# Patient Record
Sex: Male | Born: 1975 | Race: White | Hispanic: No | Smoking: Former smoker
Health system: Southern US, Community
[De-identification: ages and names within clinical notes are randomized; demographics above are authoritative.]

## PROBLEM LIST (undated history)

## (undated) DIAGNOSIS — T148XXA Other injury of unspecified body region, initial encounter: Secondary | ICD-10-CM

## (undated) DIAGNOSIS — E119 Type 2 diabetes mellitus without complications: Secondary | ICD-10-CM

## (undated) DIAGNOSIS — Z95810 Presence of automatic (implantable) cardiac defibrillator: Secondary | ICD-10-CM

## (undated) DIAGNOSIS — I519 Heart disease, unspecified: Secondary | ICD-10-CM

## (undated) DIAGNOSIS — F32A Depression, unspecified: Secondary | ICD-10-CM

## (undated) DIAGNOSIS — F329 Major depressive disorder, single episode, unspecified: Secondary | ICD-10-CM

## (undated) DIAGNOSIS — J45909 Unspecified asthma, uncomplicated: Secondary | ICD-10-CM

## (undated) DIAGNOSIS — F419 Anxiety disorder, unspecified: Secondary | ICD-10-CM

## (undated) HISTORY — DX: Type 2 diabetes mellitus without complications: E11.9

## (undated) HISTORY — PX: APPENDECTOMY: SHX54

## (undated) NOTE — *Deleted (*Deleted)
***In Progress*** PCP: [facility] Primary Cardiologist: Dr Shirlee Latch   HPI:  [name] is a 74 y.o. with history of anxiety, asthma, and depression.Previously a heavy drinker and smoker.  No longer has driver's license due to multiple DWIs.   Admitted 10/08/19 after syncopal episode and dyspnea with exertion. Had small SAH and rib fracture. Had echo that showed severely reduced EF, new onset acute systolic heart failure. Cardiac cath was negative for coronary disease, and showed low filling pressures and adequate cardiac output. Discharged with Life Vest given concern that syncope was arrhythmic. Started low dose HF meds.   Was evaluated at [facility] ED in 3/21 due to dyspnea and rib pain. New rib fracture was noted on the left. SAH had resolved. CXR and CTA negative. He was discharged to home.   Echo in 6/21 showed that EF remained 30%. Now with [employer] ICD in 8/21.   He returned to clinic with Dr. Shirlee Latch 05/09/20 for follow up of CHF.  He was not drinking or smoking.  Felt the best that he's felt in a long time.  No activity limitations, no significant exertional dyspnea or chest pain.  No lightheadedness.  No orthopnea/PND.    Today he returns to HF clinic for pharmacist medication titration. At last visit with Dr. Shirlee Latch on 05/09/20, eplerenone was increased to 50 mg daily and carvedilol was increased to 9.375 mg BID.   Overall feeling ***. Dizziness, lightheadedness, fatigue:  Chest pain or palpitations.  How is your breathing?: *** SOB Able to complete all ADLs. Activity level ***  Weight at home pounds. Takes furosemide/torsemide/bumex *** mg *** daily.  PND/Orthopnea:   Appetite ***.   HF Medications: Carvedilol 9.375 mg BID  Entresto 97/103 mg BID Eplerenone 50 mg daily  Dapagliflozin 10 mg daily  Digoxin 0.125 mg daily  Has the patient been experiencing any side effects to the medications prescribed?  {YES NO:22349}  Does the patient have any problems  obtaining medications due to transportation or finances?   {YES NO:22349} [place] Medicaid  Understanding of regimen: {excellent/good/fair/poor:19665} Understanding of indications: {excellent/good/fair/poor:19665} Potential of compliance: {excellent/good/fair/poor:19665} Patient understands to avoid NSAIDs. Patient understands to avoid decongestants.    Pertinent Lab Values 05/09/20: . Serum creatinine 1.13, BUN 20, Potassium 4.5, Sodium 139 Digoxin 0.4   Vital Signs: . Weight: *** (last clinic weight: 204 lbs) . Blood pressure: ***  . Heart rate: ***   Assessment: 1. Chronic Systolic HF:  Nonischemic cardiomyopathy. Echo in 2/21 showedLV dilation with EF <20%, the RV appeared normal. Given the LV dilation, Dr. Shirlee Latch suspected that the cardiomyopathy was not new (probably less likely to be a stress cardiomyopathy related to the subarachnoid hemorrhage). 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 He is no longer drinking. Cannot rule out viral cardiomyopathy. He has multiple family members with "heart disease" but he does not have details. Coronary angiography did not show any significant CAD.Cardiac MRI showed no infiltrative disease, no prior MI, EF 25%, RV normal. Echo in 6/21 showed EF 30% with diffuse hypokinesis, mild LV dilation. He had a Environmental manager ICD placed.  NYHA class I symptoms, he is not volume overloaded  - Continue carvedilol 9.375 mg BID. - Continue Entresto 97/103 BID.  - Continue eplerenone 50 mg daily.  - Continue Farxiga 10 mg daily.  - Continue digoxin 0.125 mg daily.  2. Syncope: Initial presentation with syncope, no prodrome.  Given low EF, ?arrhythmic event.  He now has ICD.   3. SAH: Small  volume SAH post-syncope in 2/21.  - Repeat CT of head 10/21/19 showed SAH has resolved.   4. ETOH abuse: Reports complete cessation.   Plan: 1) Medication changes: Based on clinical presentation, vital signs and recent labs will *** 2) Labs: *** 3)  Follow-up: ***   Karle Plumber, PharmD, BCPS, BCCP, CPP Heart Failure Clinic Pharmacist [phone]

---

## 2002-07-31 ENCOUNTER — Emergency Department (HOSPITAL_COMMUNITY): Admission: EM | Admit: 2002-07-31 | Discharge: 2002-07-31 | Payer: Self-pay | Admitting: Emergency Medicine

## 2015-01-17 DIAGNOSIS — J309 Allergic rhinitis, unspecified: Secondary | ICD-10-CM | POA: Insufficient documentation

## 2015-02-01 DIAGNOSIS — M25562 Pain in left knee: Secondary | ICD-10-CM | POA: Insufficient documentation

## 2015-02-16 DIAGNOSIS — F4321 Adjustment disorder with depressed mood: Secondary | ICD-10-CM | POA: Insufficient documentation

## 2016-05-03 ENCOUNTER — Encounter (HOSPITAL_COMMUNITY): Payer: Self-pay | Admitting: Cardiology

## 2016-05-03 ENCOUNTER — Emergency Department (HOSPITAL_COMMUNITY): Payer: Self-pay

## 2016-05-03 ENCOUNTER — Emergency Department (HOSPITAL_COMMUNITY)
Admission: EM | Admit: 2016-05-03 | Discharge: 2016-05-03 | Disposition: A | Discharge status: Home / Self Care | Payer: Self-pay | Attending: Emergency Medicine | Admitting: Emergency Medicine

## 2016-05-03 DIAGNOSIS — J40 Bronchitis, not specified as acute or chronic: Secondary | ICD-10-CM | POA: Insufficient documentation

## 2016-05-03 DIAGNOSIS — Z79899 Other long term (current) drug therapy: Secondary | ICD-10-CM | POA: Insufficient documentation

## 2016-05-03 DIAGNOSIS — Z791 Long term (current) use of non-steroidal anti-inflammatories (NSAID): Secondary | ICD-10-CM | POA: Insufficient documentation

## 2016-05-03 DIAGNOSIS — F17200 Nicotine dependence, unspecified, uncomplicated: Secondary | ICD-10-CM | POA: Insufficient documentation

## 2016-05-03 HISTORY — DX: Other injury of unspecified body region, initial encounter: T14.8XXA

## 2016-05-03 HISTORY — DX: Depression, unspecified: F32.A

## 2016-05-03 HISTORY — DX: Major depressive disorder, single episode, unspecified: F32.9

## 2016-05-03 HISTORY — DX: Anxiety disorder, unspecified: F41.9

## 2016-05-03 HISTORY — DX: Unspecified asthma, uncomplicated: J45.909

## 2016-05-03 MED ORDER — BENZONATATE 100 MG PO CAPS: 100.0000 mg | ORAL_CAPSULE | Freq: Three times a day (TID) | ORAL | 0 refills | Status: DC | PRN

## 2016-05-03 MED ORDER — ALBUTEROL SULFATE HFA 108 (90 BASE) MCG/ACT IN AERS
2.0000 | INHALATION_SPRAY | Freq: Once | RESPIRATORY_TRACT | Status: AC
  Administered 2016-05-03: 2 via RESPIRATORY_TRACT
  Filled 2016-05-03: qty 6.7

## 2016-05-03 MED ORDER — KETOROLAC TROMETHAMINE 60 MG/2ML IM SOLN
60.0000 mg | Freq: Once | INTRAMUSCULAR | Status: AC
  Administered 2016-05-03: 60 mg via INTRAMUSCULAR
  Filled 2016-05-03: qty 2

## 2016-05-03 MED ORDER — IPRATROPIUM-ALBUTEROL 0.5-2.5 (3) MG/3ML IN SOLN
3.0000 mL | Freq: Once | RESPIRATORY_TRACT | Status: AC
  Administered 2016-05-03: 3 mL via RESPIRATORY_TRACT
  Filled 2016-05-03: qty 3

## 2016-05-03 MED ORDER — PREDNISONE 50 MG PO TABS
60.0000 mg | ORAL_TABLET | Freq: Once | ORAL | Status: AC
  Administered 2016-05-03: 60 mg via ORAL
  Filled 2016-05-03: qty 1

## 2016-05-03 MED ORDER — PREDNISONE 20 MG PO TABS: ORAL_TABLET | ORAL | 0 refills | Status: DC

## 2016-05-03 MED ORDER — BENZONATATE 100 MG PO CAPS
100.0000 mg | ORAL_CAPSULE | Freq: Once | ORAL | Status: AC
  Administered 2016-05-03: 100 mg via ORAL
  Filled 2016-05-03: qty 1

## 2016-05-03 NOTE — ED Provider Notes (Signed)
[facility]-EMERGENCY DEPT Provider Note   CSN: [id number] Arrival date & time: 05/03/16  1824     History   Chief Complaint Chief Complaint  Patient presents with  . Shortness of Breath    HPI Andrew Hinton is a 40 y.o. male.   Cough  This is a new problem. The current episode started more than 2 days ago. The problem occurs constantly. The problem has been gradually worsening. The cough is productive of sputum. There has been no fever. Associated symptoms include chest pain, rhinorrhea, sore throat, shortness of breath and wheezing. He has tried cough syrup for the symptoms. The treatment provided mild relief.    Past Medical History:  Diagnosis Date  . Anxiety   . Asthma   . Depression   . Nerve damage     There are no active problems to display for this patient.   Past Surgical History:  Procedure Laterality Date  . APPENDECTOMY         Home Medications    Prior to Admission medications   Medication Sig Start Date End Date Taking? Authorizing Provider  albuterol (PROVENTIL HFA;VENTOLIN HFA) 108 (90 Base) MCG/ACT inhaler Inhale 2 puffs into the lungs every 4 (four) hours as needed for Wheezing. 09/23/15 09/22/16 Yes Historical Provider, MD  amitriptyline (ELAVIL) 25 MG tablet Take 25 mg by mouth at bedtime. 02/16/15  Yes Historical Provider, MD  DM-Doxylamine-Acetaminophen (NIGHTTIME COLD & FLU MAX STR) 15-6.25-325 MG/15ML LIQD Take 10 mLs by mouth daily as needed (for cold symptoms).   Yes Historical Provider, MD  FLUoxetine (PROZAC) 20 MG capsule Take 20 mg by mouth daily.   Yes Historical Provider, MD  gabapentin (NEURONTIN) 600 MG tablet Take 600 mg by mouth 3 (three) times daily.   Yes Historical Provider, MD  HYDROcodone-acetaminophen (NORCO/VICODIN) 5-325 MG tablet Take 1 tablet by mouth every 6 (six) hours as needed. 03/31/16  Yes Historical Provider, MD  ibuprofen (ADVIL,MOTRIN) 600 MG tablet Take 600 mg by mouth every 6 (six) hours as needed. 03/31/16  Yes  Historical Provider, MD  pseudoephedrine (SUDAFED) 30 MG tablet Take 30 mg by mouth every 4 (four) hours as needed for congestion.   Yes Historical Provider, MD  benzonatate (TESSALON) 100 MG capsule Take 1 capsule (100 mg total) by mouth 3 (three) times daily as needed for cough. 05/03/16   Marily Memos, MD  predniSONE (DELTASONE) 20 MG tablet 2 tabs po daily x 4 days 05/04/16   Marily Memos, MD    Family History History reviewed. No pertinent family history.  Social History Social History  Substance Use Topics  . Smoking status: Current Every Day Smoker  . Smokeless tobacco: Never Used  . Alcohol use No     Allergies   Review of patient's allergies indicates no known allergies.   Review of Systems Review of Systems  HENT: Positive for rhinorrhea and sore throat.   Respiratory: Positive for cough, shortness of breath and wheezing.   Cardiovascular: Positive for chest pain.  All other systems reviewed and are negative.    Physical Exam Updated Vital Signs BP 122/79 (BP Location: Left Arm)   Pulse 85   Temp 98.4 F (36.9 C) (Oral)   Resp 20   Ht 6' (1.829 m)   Wt 200 lb (90.7 kg)   SpO2 98%   BMI 27.12 kg/m   Physical Exam  Constitutional: He appears well-developed and well-nourished.  HENT:  Head: Normocephalic and atraumatic.  Eyes: Conjunctivae are normal.  Neck: Neck  supple.  Cardiovascular: Normal rate and regular rhythm.   No murmur heard. Pulmonary/Chest: Effort normal and breath sounds normal. No respiratory distress. Wheezes: diffuse.  Abdominal: Soft. There is no tenderness.  Musculoskeletal: He exhibits no edema or deformity.  Neurological: He is alert.  Skin: Skin is warm and dry.  Psychiatric: He has a normal mood and affect.  Nursing note and vitals reviewed.    ED Treatments / Results  Labs (all labs ordered are listed, but only abnormal results are displayed) Labs Reviewed - No data to display  EKG  EKG  Interpretation  Date/Time:  Thursday May 03 2016 18:37:39 EDT Ventricular Rate:  77 PR Interval:    QRS Duration: 92 QT Interval:  385 QTC Calculation: 436 R Axis:   40 Text Interpretation:  Sinus rhythm Posterior infarct, old Borderline repolarization abnormality Confirmed by [facility]ES[facility] MD, Daniqua Campoy [id number]) on 05/03/2016 7:20:51 PM       Radiology Dg Chest 2 View  Result Date: 05/03/2016 CLINICAL DATA:  Nonproductive cough and chest pain. EXAM: CHEST  2 VIEW COMPARISON:  None. FINDINGS: The cardiac silhouette, mediastinal and hilar contours are within normal limits. Mild peribronchial thickening could suggest bronchitis. No infiltrates or effusions. The bony thorax is intact. IMPRESSION: Possible mild bronchitis but no infiltrates or effusions. Electronically Signed   By: Rudie MeyerP.  Gallerani M.D.   On: 05/03/2016 20:10    Procedures Procedures (including critical care time)   Counseled patient for approximately 10 minutes regarding smoking cessation. Discussed risks of smoking and how they applied and affected their visit here today. Patient not ready to quit at this time, however will follow up with their primary doctor when they are.   CPT code: [id number]: intermediate counseling for smoking cessation   Medications Ordered in ED Medications  predniSONE (DELTASONE) tablet 60 mg (60 mg Oral Given 05/03/16 1904)  ipratropium-albuterol (DUONEB) 0.5-2.5 (3) MG/3ML nebulizer solution 3 mL (3 mLs Nebulization Given 05/03/16 1950)  ketorolac (TORADOL) injection 60 mg (60 mg Intramuscular Given 05/03/16 1905)  benzonatate (TESSALON) capsule 100 mg (100 mg Oral Given 05/03/16 1904)  albuterol (PROVENTIL HFA;VENTOLIN HFA) 108 (90 Base) MCG/ACT inhaler 2 puff (2 puffs Inhalation Given 05/03/16 2057)     Initial Impression / Assessment and Plan / ED Course  I have reviewed the triage vital signs and the nursing notes.  Pertinent labs & imaging results that were available during my care of the patient  were reviewed by me and considered in my medical decision making (see chart for details).  Clinical Course   40 year old smoker with likely bronchitis. Doubt pulmonary embolus, cardiac origin or pneumonia however with chest x-ray just to be sure. No indication for antibiotics this time. We'll treat as bronchitis with steroids, Tessalon, Toradol for his pleuritic chest pain and inhalers. Symptoms significantly improved. cxr negative. Plan for bronchitis treatment at home. Will consider smoking cessation further.   Final Clinical Impressions(s) / ED Diagnoses   Final diagnoses:  Bronchitis    New Prescriptions Discharge Medication List as of 05/03/2016  8:42 PM    START taking these medications   Details  benzonatate (TESSALON) 100 MG capsule Take 1 capsule (100 mg total) by mouth 3 (three) times daily as needed for cough., Starting Thu 05/03/2016, Print    predniSONE (DELTASONE) 20 MG tablet 2 tabs po daily x 4 days, Print         Marily MemosJason Eryc Bodey, MD 05/03/16 2128

## 2016-05-03 NOTE — ED Triage Notes (Addendum)
Pt c/o sob and chest pain times 3 days.  Coughing and nasal congestion   Chest pain worsens with the coughing

## 2016-08-23 ENCOUNTER — Encounter (HOSPITAL_COMMUNITY): Payer: Self-pay | Admitting: *Deleted

## 2016-08-23 ENCOUNTER — Emergency Department (HOSPITAL_COMMUNITY)
Admission: EM | Admit: 2016-08-23 | Discharge: 2016-08-23 | Disposition: A | Discharge status: Home / Self Care | Payer: Self-pay | Attending: Emergency Medicine | Admitting: Emergency Medicine

## 2016-08-23 ENCOUNTER — Emergency Department (HOSPITAL_COMMUNITY): Payer: Self-pay

## 2016-08-23 DIAGNOSIS — R1110 Vomiting, unspecified: Secondary | ICD-10-CM | POA: Insufficient documentation

## 2016-08-23 DIAGNOSIS — J45909 Unspecified asthma, uncomplicated: Secondary | ICD-10-CM | POA: Insufficient documentation

## 2016-08-23 DIAGNOSIS — R0602 Shortness of breath: Secondary | ICD-10-CM | POA: Insufficient documentation

## 2016-08-23 DIAGNOSIS — J029 Acute pharyngitis, unspecified: Secondary | ICD-10-CM | POA: Insufficient documentation

## 2016-08-23 DIAGNOSIS — F17200 Nicotine dependence, unspecified, uncomplicated: Secondary | ICD-10-CM | POA: Insufficient documentation

## 2016-08-23 DIAGNOSIS — R059 Cough, unspecified: Secondary | ICD-10-CM

## 2016-08-23 DIAGNOSIS — R05 Cough: Secondary | ICD-10-CM | POA: Insufficient documentation

## 2016-08-23 MED ORDER — ONDANSETRON 8 MG PO TBDP
8.0000 mg | ORAL_TABLET | Freq: Once | ORAL | Status: AC
  Administered 2016-08-23: 8 mg via ORAL
  Filled 2016-08-23: qty 1

## 2016-08-23 MED ORDER — IPRATROPIUM BROMIDE 0.02 % IN SOLN
0.5000 mg | Freq: Once | RESPIRATORY_TRACT | Status: AC
  Administered 2016-08-23: 0.5 mg via RESPIRATORY_TRACT
  Filled 2016-08-23: qty 2.5

## 2016-08-23 MED ORDER — ALBUTEROL SULFATE (2.5 MG/3ML) 0.083% IN NEBU
5.0000 mg | INHALATION_SOLUTION | Freq: Once | RESPIRATORY_TRACT | Status: AC
  Administered 2016-08-23: 5 mg via RESPIRATORY_TRACT
  Filled 2016-08-23: qty 6

## 2016-08-23 NOTE — ED Triage Notes (Signed)
Pt c/o cough and congestion x 3 days ?

## 2016-08-23 NOTE — ED Provider Notes (Signed)
[facility] Provider Note   CSN: [id number] Arrival date & time: 08/23/16  0204     History   Chief Complaint Chief Complaint  Patient presents with  . Cough    HPI Andrew Hinton is a 41 y.o. male.  The history is provided by the patient.  Cough  This is a new problem. The current episode started more than 2 days ago. The problem occurs hourly. The problem has been gradually worsening. The cough is productive of sputum. There has been no fever. Associated symptoms include sore throat and shortness of breath. He is a smoker. His past medical history is significant for bronchitis.  Patient reports he began coughing about 3 days ago He reports it is worsening No hemoptysis reported No fever He reports he just had an episode of post tussive emesis He reports his chest wall is now sore from coughing  No new abd pain He reports chronic back pain that is unchanged  Past Medical History:  Diagnosis Date  . Anxiety   . Asthma   . Depression   . Nerve damage     There are no active problems to display for this patient.   Past Surgical History:  Procedure Laterality Date  . APPENDECTOMY         Home Medications    Prior to Admission medications   Not on File    Family History History reviewed. No pertinent family history.  Social History Social History  Substance Use Topics  . Smoking status: Current Every Day Smoker  . Smokeless tobacco: Never Used  . Alcohol use No     Allergies   Patient has no known allergies.   Review of Systems Review of Systems  Constitutional: Negative for fever.  HENT: Positive for sore throat.   Respiratory: Positive for cough and shortness of breath.   Cardiovascular: Negative for leg swelling.  Gastrointestinal:       Episode of post tussive emesis just prior to arrival   All other systems reviewed and are negative.    Physical Exam Updated Vital Signs BP 133/100   Pulse 89   Temp 98.6 F (37  C) (Oral)   Resp 20   Ht 6' (1.829 m)   Wt 90.7 kg   SpO2 96%   BMI 27.12 kg/m   Physical Exam CONSTITUTIONAL: Well developed/well nourished HEAD: Normocephalic/atraumatic EYES: EOMI/PERRL ENMT: Mucous membranes moist, nasal congestion NECK: supple no meningeal signs SPINE/BACK:entire spine nontender CV: S1/S2 noted, no murmurs/rubs/gallops noted LUNGS: scattered coarse BS noted.  No crackles noted.  No distress ABDOMEN: soft, nontender GU:no cva tenderness NEURO: Pt is awake/alert/appropriate, moves all extremitiesx4.  No facial droop.   EXTREMITIES: pulses normal/equal, full ROM, no LE edema SKIN: warm, color normal PSYCH: no abnormalities of mood noted, alert and oriented to situation   ED Treatments / Results  Labs (all labs ordered are listed, but only abnormal results are displayed) Labs Reviewed - No data to display  EKG  EKG Interpretation None       Radiology Dg Chest 2 View  Result Date: 08/23/2016 CLINICAL DATA:  Cough and congestion EXAM: CHEST  2 VIEW COMPARISON:  Chest radiograph 05/03/2016 FINDINGS: The lungs are well inflated. Cardiomediastinal contours are normal. No pneumothorax or pleural effusion. No focal airspace consolidation or pulmonary edema. IMPRESSION: Clear lungs. Electronically Signed   By: Deatra Robinson M.D.   On: 08/23/2016 04:25    Procedures Procedures (including critical care time)  Medications Ordered in ED Medications  albuterol (PROVENTIL) (2.5 MG/3ML) 0.083% nebulizer solution 5 mg (5 mg Nebulization Given 08/23/16 0313)  ipratropium (ATROVENT) nebulizer solution 0.5 mg (0.5 mg Nebulization Given 08/23/16 0313)  ondansetron (ZOFRAN-ODT) disintegrating tablet 8 mg (8 mg Oral Given 08/23/16 0339)     Initial Impression / Assessment and Plan / ED Course  I have reviewed the triage vital signs and the nursing notes.  Pertinent labs & imaging results that were available during my care of the patient were reviewed by me and  considered in my medical decision making (see chart for details).  Clinical Course     Pt here with continued cough Overall he is well appearing No hypoxia No distress Will give neb treatment and reassess His son is sick with similar symptoms as well 3:36 AM After nebs, pt with crackles noted to right base Will get CXR Pt otherwise stable, he reports mild nausea from nebs  4:29 AM Xray negative Will d/c home Suspect viral URI.  Clinically at this time does not appear c/w influenza  Final Clinical Impressions(s) / ED Diagnoses   Final diagnoses:  Cough    New Prescriptions New Prescriptions   No medications on file     Zadie Rhine, MD 08/23/16 0430

## 2016-08-27 ENCOUNTER — Emergency Department (HOSPITAL_COMMUNITY)
Admission: EM | Admit: 2016-08-27 | Discharge: 2016-08-27 | Disposition: A | Discharge status: Home / Self Care | Payer: Self-pay | Attending: Emergency Medicine | Admitting: Emergency Medicine

## 2016-08-27 ENCOUNTER — Encounter (HOSPITAL_COMMUNITY): Payer: Self-pay | Admitting: Emergency Medicine

## 2016-08-27 DIAGNOSIS — R05 Cough: Secondary | ICD-10-CM | POA: Insufficient documentation

## 2016-08-27 DIAGNOSIS — F17200 Nicotine dependence, unspecified, uncomplicated: Secondary | ICD-10-CM | POA: Insufficient documentation

## 2016-08-27 DIAGNOSIS — R059 Cough, unspecified: Secondary | ICD-10-CM

## 2016-08-27 DIAGNOSIS — R1110 Vomiting, unspecified: Secondary | ICD-10-CM | POA: Insufficient documentation

## 2016-08-27 DIAGNOSIS — R109 Unspecified abdominal pain: Secondary | ICD-10-CM | POA: Insufficient documentation

## 2016-08-27 DIAGNOSIS — R197 Diarrhea, unspecified: Secondary | ICD-10-CM | POA: Insufficient documentation

## 2016-08-27 DIAGNOSIS — J45909 Unspecified asthma, uncomplicated: Secondary | ICD-10-CM | POA: Insufficient documentation

## 2016-08-27 DIAGNOSIS — R0981 Nasal congestion: Secondary | ICD-10-CM | POA: Insufficient documentation

## 2016-08-27 MED ORDER — ALBUTEROL SULFATE HFA 108 (90 BASE) MCG/ACT IN AERS: 2.0000 | INHALATION_SPRAY | RESPIRATORY_TRACT | 0 refills | Status: DC | PRN

## 2016-08-27 MED ORDER — LOPERAMIDE HCL 2 MG PO CAPS: 2.0000 mg | ORAL_CAPSULE | Freq: Four times a day (QID) | ORAL | 0 refills | Status: DC | PRN

## 2016-08-27 NOTE — ED Provider Notes (Signed)
[facility] DE[facility] Provider Note   CSN: [id number] Arrival date & time: 08/27/16  0712     History   Chief Complaint Chief Complaint  Patient presents with  . Cough  . Abdominal Pain    HPI Andrew Hinton is a 41 y.o. male.  HPI  41 year old male who presents with cough and vomiting. He has a history of anxiety, asthma, and tobacco use. States one week ago developed cough productive of sputum with subjective fevers and chills. He is seated with sore throat, runny nose, chest congestion, and diarrhea after eating. After the first 2 or 3 days, he says that his fever had resolved. He was seen in the emergency department on January 11 and diagnosed with likely viral infection with negative chest x-ray. He has had occasional episodes of posttussive emesis during this time. Last night had some abdominal cramping associated with diarrhea. This morning with episode of coughing fit and subsequent vomiting. Did notice a streak of blood that was in the vomitus. No hemoptysis, melena, hematochezia, dysuria or urinary frequency. He states all of his family members are currently sick with same illness.  Past Medical History:  Diagnosis Date  . Anxiety   . Asthma   . Depression   . Nerve damage     There are no active problems to display for this patient.   Past Surgical History:  Procedure Laterality Date  . APPENDECTOMY         Home Medications    Prior to Admission medications   Not on File    Family History History reviewed. No pertinent family history.  Social History Social History  Substance Use Topics  . Smoking status: Current Every Day Smoker  . Smokeless tobacco: Never Used  . Alcohol use No     Allergies   Patient has no known allergies.   Review of Systems Review of Systems  Constitutional: Negative for fever.  HENT: Positive for congestion and rhinorrhea.   Respiratory: Positive for cough. Negative for shortness of breath.   Cardiovascular:  Negative for chest pain.  Gastrointestinal: Positive for diarrhea and vomiting.  Genitourinary: Negative for difficulty urinating.  Musculoskeletal: Positive for back pain.  Allergic/Immunologic: Negative for immunocompromised state.  Hematological: Does not bruise/bleed easily.  All other systems reviewed and are negative.    Physical Exam Updated Vital Signs BP 127/95 (BP Location: Right Arm)   Pulse 73   Temp 98.6 F (37 C) (Oral)   Resp 20   Ht 6' (1.829 m)   Wt 200 lb (90.7 kg)   SpO2 100%   BMI 27.12 kg/m   Physical Exam Physical Exam  Nursing note and vitals reviewed. Constitutional: Well developed, well nourished, non-toxic, and in no acute distress Head: Normocephalic and atraumatic.  Mouth/Throat: Oropharynx is clear and moist.  Neck: Normal range of motion. Neck supple.  Cardiovascular: Normal rate and regular rhythm.   Pulmonary/Chest: Effort normal and breath sounds normal.  Abdominal: Soft. There is no tenderness. There is no rebound and no guarding.  Musculoskeletal: Normal range of motion.  Neurological: Alert, no facial droop, fluent speech, moves all extremities symmetrically Skin: Skin is warm and dry.  Psychiatric: Cooperative   ED Treatments / Results  Labs (all labs ordered are listed, but only abnormal results are displayed) Labs Reviewed - No data to display  EKG  EKG Interpretation None       Radiology No results found.  Procedures Procedures (including critical care time)  Medications Ordered in  ED Medications - No data to display   Initial Impression / Assessment and Plan / ED Course  I have reviewed the triage vital signs and the nursing notes.  Pertinent labs & imaging results that were available during my care of the patient were reviewed by me and considered in my medical decision making (see chart for details).  Clinical Course     Presenting with cough and post-tussive emesis with streak of blood this morning. All  of family members sick with same illness. Chest x-ray from 4 days ago reviewed and shows no acute cardiopulmonary processes. He has not had fever or difficulty breathing, and he has normal vital signs. His abdomen is soft and benign, painful with cramping associated with diarrhea not suspicious of serious intra-abdominal processes at this time. Presentation still consistent with that of viral illness potentially influenza, but he is out of the window for Tamiflu to be effective. Streak of blood likely mallory weiss tear from forceful vomiting. No melena, hematochezia to suggest serious GI bleed at this time. He will continue to monitor.  I discussed continued supportive care management. Discussed strict return instructions.  The patient appears reasonably screened and/or stabilized for discharge and I doubt any other medical condition or other [facility] requiring further screening, evaluation, or treatment in the ED at this time prior to discharge.  Strict return and follow-up instructions reviewed. She expressed understanding of all discharge instructions and felt comfortable with the plan of care.   Final Clinical Impressions(s) / ED Diagnoses   Final diagnoses:  Cough  Vomiting and diarrhea    New Prescriptions New Prescriptions   No medications on file     Lavera Guise, MD 08/27/16 [phone]

## 2016-08-27 NOTE — Discharge Instructions (Signed)
You have persistent viral illness, possible flu. It can take 10-14 days to fully resolve.  Get plenty of rest and make sure you are well hydrated.  Return without fail for worsening symptoms, including new fever, difficulty breathing, passing out, escalating pain, intractable vomiting or any other symptoms concerning to you.

## 2016-08-27 NOTE — ED Triage Notes (Signed)
Patient complaining of cough and abdominal pain x 6 days. States he was treated here recently for same. States he had one episode of vomiting "after coughing" this morning.

## 2017-09-07 ENCOUNTER — Emergency Department (HOSPITAL_COMMUNITY)
Admission: EM | Admit: 2017-09-07 | Discharge: 2017-09-07 | Disposition: A | Discharge status: Home / Self Care | Payer: Self-pay | Attending: Emergency Medicine | Admitting: Emergency Medicine

## 2017-09-07 ENCOUNTER — Other Ambulatory Visit: Payer: Self-pay

## 2017-09-07 ENCOUNTER — Encounter (HOSPITAL_COMMUNITY): Payer: Self-pay | Admitting: Emergency Medicine

## 2017-09-07 ENCOUNTER — Emergency Department (HOSPITAL_COMMUNITY): Payer: Self-pay

## 2017-09-07 DIAGNOSIS — J45909 Unspecified asthma, uncomplicated: Secondary | ICD-10-CM | POA: Insufficient documentation

## 2017-09-07 DIAGNOSIS — W109XXA Fall (on) (from) unspecified stairs and steps, initial encounter: Secondary | ICD-10-CM | POA: Insufficient documentation

## 2017-09-07 DIAGNOSIS — Y999 Unspecified external cause status: Secondary | ICD-10-CM | POA: Insufficient documentation

## 2017-09-07 DIAGNOSIS — F17210 Nicotine dependence, cigarettes, uncomplicated: Secondary | ICD-10-CM | POA: Insufficient documentation

## 2017-09-07 DIAGNOSIS — S2241XA Multiple fractures of ribs, right side, initial encounter for closed fracture: Secondary | ICD-10-CM | POA: Insufficient documentation

## 2017-09-07 DIAGNOSIS — Y929 Unspecified place or not applicable: Secondary | ICD-10-CM | POA: Insufficient documentation

## 2017-09-07 DIAGNOSIS — Y9389 Activity, other specified: Secondary | ICD-10-CM | POA: Insufficient documentation

## 2017-09-07 MED ORDER — KETOROLAC TROMETHAMINE 60 MG/2ML IM SOLN
60.0000 mg | Freq: Once | INTRAMUSCULAR | Status: AC
  Administered 2017-09-07: 60 mg via INTRAMUSCULAR
  Filled 2017-09-07: qty 2

## 2017-09-07 MED ORDER — ONDANSETRON 8 MG PO TBDP
8.0000 mg | ORAL_TABLET | Freq: Once | ORAL | Status: AC
  Administered 2017-09-07: 8 mg via ORAL
  Filled 2017-09-07: qty 1

## 2017-09-07 MED ORDER — OXYCODONE-ACETAMINOPHEN 5-325 MG PO TABS
2.0000 | ORAL_TABLET | Freq: Once | ORAL | Status: AC
  Administered 2017-09-07: 2 via ORAL
  Filled 2017-09-07: qty 2

## 2017-09-07 MED ORDER — OXYCODONE-ACETAMINOPHEN 5-325 MG PO TABS: 1.0000 | ORAL_TABLET | ORAL | 0 refills | Status: DC | PRN

## 2017-09-07 NOTE — Discharge Instructions (Signed)
Use a small pillow or a folded towel to apply firm pressure to your ribs to cough and take deep breaths several times a day. Follow-up with your primary doctor for recheck in 3-4 days.  Return here for any worsening symptoms such as increasing pain or sudden shortness of breath

## 2017-09-07 NOTE — ED Triage Notes (Signed)
Pt reports falling down the basement steps last night and has been having right rib pain.

## 2017-09-07 NOTE — ED Notes (Signed)
Pt ambulates with grunt and guarding R side  Reports that he fell down basement stairs last PM- denies loc  Now with pain to his R posterior ribs  Pt is non smoker, but smells of wood smoke

## 2017-09-08 NOTE — ED Provider Notes (Signed)
[facility] EMERGENCY DEPARTMENT Provider Note   CSN: [id number] Arrival date & time: 09/07/17  1346     History   Chief Complaint Chief Complaint  Patient presents with  . Fall    HPI Andrew Hinton is a 42 y.o. male.  HPI   Andrew Hinton is a 42 y.o. male who presents to the Emergency Department complaining of severe right rib pain secondary to falling down several steps.  He complains of a mechanical fall that occurred on the evening prior to ER arrival in which he "missed a step" and fell down several steps landing on his right side.  Pain has been persistant and worsens with deep breathing and movement.  Improves slightly with rest.  He denies head injury, neck or back pain, abdominal pain, vomiting, and shortness of breath.  He has tried OTC pain relievers without improvement.     Past Medical History:  Diagnosis Date  . Anxiety   . Asthma   . Depression   . Nerve damage     There are no active problems to display for this patient.   Past Surgical History:  Procedure Laterality Date  . APPENDECTOMY         Home Medications    Prior to Admission medications   Medication Sig Start Date End Date Taking? Authorizing Provider  albuterol (PROVENTIL HFA;VENTOLIN HFA) 108 (90 Base) MCG/ACT inhaler Inhale 2 puffs into the lungs every 4 (four) hours as needed for wheezing or shortness of breath. 08/27/16   Lavera Guise, MD  loperamide (IMODIUM) 2 MG capsule Take 1 capsule (2 mg total) by mouth 4 (four) times daily as needed for diarrhea or loose stools. 08/27/16   Lavera Guise, MD  oxyCODONE-acetaminophen (PERCOCET/ROXICET) 5-325 MG tablet Take 1 tablet by mouth every 4 (four) hours as needed. 09/07/17   Pauline Aus, PA-C    Family History History reviewed. No pertinent family history.  Social History Social History   Tobacco Use  . Smoking status: Current Every Day Smoker    Packs/day: 1.00    Types: Cigarettes  . Smokeless tobacco: Never  Used  Substance Use Topics  . Alcohol use: No  . Drug use: No     Allergies   Patient has no known allergies.   Review of Systems Review of Systems  Constitutional: Negative for chills and fever.  Eyes: Negative for visual disturbance.  Respiratory: Negative for cough and shortness of breath.   Cardiovascular: Positive for chest pain (right rib pain).  Gastrointestinal: Negative for abdominal pain, nausea and vomiting.  Genitourinary: Negative for difficulty urinating, dysuria, flank pain and hematuria.  Musculoskeletal: Negative for back pain, joint swelling and neck pain.  Skin: Negative for color change and wound.  Neurological: Negative for syncope, weakness, numbness and headaches.  All other systems reviewed and are negative.    Physical Exam Updated Vital Signs BP 129/89 (BP Location: Right Arm)   Pulse (!) 103   Temp 98.4 F (36.9 C) (Oral)   Resp 20   Ht 6' (1.829 m)   Wt 90.7 kg (200 lb)   SpO2 99%   BMI 27.12 kg/m   Physical Exam  Constitutional: He is oriented to person, place, and time. He appears well-developed and well-nourished.  Pt is uncomfortable appearing, pacing in the room  HENT:  Head: Atraumatic.  Mouth/Throat: Oropharynx is clear and moist.  Eyes: Conjunctivae and EOM are normal. Pupils are equal, round, and reactive to light.  Neck: Normal range  of motion. Neck supple.  Cardiovascular: Normal rate, regular rhythm and intact distal pulses.  No murmur heard. Pulmonary/Chest: Effort normal and breath sounds normal. No respiratory distress. He exhibits tenderness (focal ttp of the lateral right ribs.  no abrasions, ecchymosis or crepitus.  ).  Abdominal: Soft. He exhibits no distension and no mass. There is no tenderness. There is no guarding.  Musculoskeletal: Normal range of motion.  No spinal tenderness  Neurological: He is alert and oriented to person, place, and time.  Skin: Skin is warm. Capillary refill takes less than 2 seconds.    Psychiatric: He has a normal mood and affect.  Nursing note and vitals reviewed.    ED Treatments / Results  Labs (all labs ordered are listed, but only abnormal results are displayed) Labs Reviewed - No data to display  EKG  EKG Interpretation None       Radiology Dg Ribs Unilateral W/chest Right  Result Date: 09/07/2017 CLINICAL DATA:  42 year old male with a history of fall and rib pain EXAM: RIGHT RIBS AND CHEST - 3+ VIEW COMPARISON:  08/23/2016, 05/03/2016 FINDINGS: Cardiomediastinal silhouette within normal limits in size and contour. No evidence of central vascular congestion. No pneumothorax or pleural effusion. No confluent airspace disease. Fractures identified involving the right seventh rib angle and the right ninth rib angle. Possible nondisplaced right eighth rib fracture. IMPRESSION: Oblique images demonstrate acute fracture of the right seventh and ninth ribs. Possible right eighth rib nondisplaced fracture. Electronically Signed   By: Gilmer Mor D.O.   On: 09/07/2017 14:27    Procedures Procedures (including critical care time)  Medications Ordered in ED Medications  oxyCODONE-acetaminophen (PERCOCET/ROXICET) 5-325 MG per tablet 2 tablet (2 tablets Oral Given 09/07/17 1424)  ondansetron (ZOFRAN-ODT) disintegrating tablet 8 mg (8 mg Oral Given 09/07/17 1423)  ketorolac (TORADOL) injection 60 mg (60 mg Intramuscular Given 09/07/17 1526)     Initial Impression / Assessment and Plan / ED Course  I have reviewed the triage vital signs and the nursing notes.  Pertinent labs & imaging results that were available during my care of the patient were reviewed by me and considered in my medical decision making (see chart for details).     pt has ambulated in the dept with a slow but steady gait.  No respiratory distress.  Tolerating po fluids.  XR results reviewed and discussed with pt.  No tachypnea.  He appears safe for discharge home, advised the patient of the  importance of deep breathing and cough.  Strict return precautions were discussed.  He verbalized understanding and agrees to plan  Final Clinical Impressions(s) / ED Diagnoses   Final diagnoses:  Closed fracture of multiple ribs of right side, initial encounter    ED Discharge Orders        Ordered    oxyCODONE-acetaminophen (PERCOCET/ROXICET) 5-325 MG tablet  Every 4 hours PRN     09/07/17 1603       Pauline Aus, PA-C 09/08/17 1711    Samuel Jester, DO 09/11/17 1557

## 2017-09-21 ENCOUNTER — Emergency Department (HOSPITAL_COMMUNITY)
Admission: EM | Admit: 2017-09-21 | Discharge: 2017-09-21 | Disposition: A | Discharge status: Home / Self Care | Payer: Self-pay | Attending: Emergency Medicine | Admitting: Emergency Medicine

## 2017-09-21 ENCOUNTER — Emergency Department (HOSPITAL_COMMUNITY): Payer: Self-pay

## 2017-09-21 ENCOUNTER — Other Ambulatory Visit: Payer: Self-pay

## 2017-09-21 DIAGNOSIS — S27322A Contusion of lung, bilateral, initial encounter: Secondary | ICD-10-CM | POA: Insufficient documentation

## 2017-09-21 DIAGNOSIS — Y939 Activity, unspecified: Secondary | ICD-10-CM | POA: Insufficient documentation

## 2017-09-21 DIAGNOSIS — F17210 Nicotine dependence, cigarettes, uncomplicated: Secondary | ICD-10-CM | POA: Insufficient documentation

## 2017-09-21 DIAGNOSIS — R042 Hemoptysis: Secondary | ICD-10-CM

## 2017-09-21 DIAGNOSIS — W19XXXA Unspecified fall, initial encounter: Secondary | ICD-10-CM | POA: Insufficient documentation

## 2017-09-21 DIAGNOSIS — Y929 Unspecified place or not applicable: Secondary | ICD-10-CM | POA: Insufficient documentation

## 2017-09-21 DIAGNOSIS — Y999 Unspecified external cause status: Secondary | ICD-10-CM | POA: Insufficient documentation

## 2017-09-21 MED ORDER — ALBUTEROL SULFATE (2.5 MG/3ML) 0.083% IN NEBU
5.0000 mg | INHALATION_SOLUTION | Freq: Once | RESPIRATORY_TRACT | Status: AC
  Administered 2017-09-21: 5 mg via RESPIRATORY_TRACT
  Filled 2017-09-21: qty 6

## 2017-09-21 MED ORDER — PREDNISONE 20 MG PO TABS: 40.0000 mg | ORAL_TABLET | Freq: Every day | ORAL | 0 refills | Status: DC

## 2017-09-21 MED ORDER — DEXAMETHASONE 10 MG/ML FOR PEDIATRIC ORAL USE
10.0000 mg | Freq: Once | INTRAMUSCULAR | Status: AC
  Administered 2017-09-21: 10 mg via ORAL
  Filled 2017-09-21: qty 1

## 2017-09-21 MED ORDER — KETOROLAC TROMETHAMINE 30 MG/ML IJ SOLN
15.0000 mg | Freq: Once | INTRAMUSCULAR | Status: AC
  Administered 2017-09-21: 15 mg via INTRAMUSCULAR
  Filled 2017-09-21: qty 1

## 2017-09-21 MED ORDER — ALBUTEROL SULFATE HFA 108 (90 BASE) MCG/ACT IN AERS
2.0000 | INHALATION_SPRAY | Freq: Four times a day (QID) | RESPIRATORY_TRACT | Status: DC
  Administered 2017-09-21: 2 via RESPIRATORY_TRACT
  Filled 2017-09-21: qty 6.7

## 2017-09-21 MED ORDER — HYDROCODONE-ACETAMINOPHEN 5-325 MG PO TABS: 1.0000 | ORAL_TABLET | Freq: Four times a day (QID) | ORAL | 0 refills | Status: DC | PRN

## 2017-09-21 NOTE — ED Triage Notes (Signed)
Pt reports was diagnosed with rib fractures x1 week ago. Pt reports continued pain with movement and inspiration. Pt also reports blood tinged sputum,chills x2 days.

## 2017-09-21 NOTE — ED Notes (Signed)
Pt not in waiting area 

## 2017-09-21 NOTE — ED Provider Notes (Signed)
[facility] EMERGENCY DEPARTMENT Provider Note   CSN: [id number] Arrival date & time: 09/21/17  1402     History   Chief Complaint Chief Complaint  Patient presents with  . Hemoptysis    HPI Andrew Hinton is a 42 y.o. male.  HPI  Chest pain, and new hemoptysis. Patient had a fall several weeks ago, for which she was seen here. He was diagnosed with rib fracture. He notes that since that time he has had persistent pain, now and over the past few days he has had hemoptysis Pain is focally in the right side, nonradiating, sore, severe, sharp, worse with breathing and coughing. No new syncope, fever, other chest pain. No vomiting, diarrhea. No relief with ibuprofen or OTC medication.   Past Medical History:  Diagnosis Date  . Anxiety   . Asthma   . Depression   . Nerve damage     There are no active problems to display for this patient.   Past Surgical History:  Procedure Laterality Date  . APPENDECTOMY         Home Medications    Prior to Admission medications   Medication Sig Start Date End Date Taking? Authorizing Provider  albuterol (PROVENTIL HFA;VENTOLIN HFA) 108 (90 Base) MCG/ACT inhaler Inhale 2 puffs into the lungs every 4 (four) hours as needed for wheezing or shortness of breath. 08/27/16   Lavera Guise, MD  loperamide (IMODIUM) 2 MG capsule Take 1 capsule (2 mg total) by mouth 4 (four) times daily as needed for diarrhea or loose stools. 08/27/16   Lavera Guise, MD  oxyCODONE-acetaminophen (PERCOCET/ROXICET) 5-325 MG tablet Take 1 tablet by mouth every 4 (four) hours as needed. 09/07/17   Triplett, Babette Relic, PA-C    Family History No family history on file.  Social History Social History   Tobacco Use  . Smoking status: Current Every Day Smoker    Packs/day: 1.00    Types: Cigarettes  . Smokeless tobacco: Never Used  Substance Use Topics  . Alcohol use: No  . Drug use: No     Allergies   Patient has no known allergies.   Review  of Systems Review of Systems  Constitutional:       Per HPI, otherwise negative  HENT:       Per HPI, otherwise negative  Respiratory: Positive for cough and shortness of breath.   Cardiovascular:       Per HPI, otherwise negative  Gastrointestinal: Negative for vomiting.  Endocrine:       Negative aside from HPI  Genitourinary:       Neg aside from HPI   Musculoskeletal:       Per HPI, otherwise negative  Skin: Negative.   Allergic/Immunologic: Negative for immunocompromised state.  Neurological: Negative for syncope.     Physical Exam Updated Vital Signs BP 131/86 (BP Location: Right Arm)   Pulse (!) 120   Temp 98.3 F (36.8 C) (Oral)   Resp 18   Ht 6' (1.829 m)   Wt 90.7 kg (200 lb)   SpO2 99%   BMI 27.12 kg/m   Physical Exam  Constitutional: He is oriented to person, place, and time. He appears well-developed. No distress.  HENT:  Head: Normocephalic and atraumatic.  Eyes: Conjunctivae and EOM are normal.  Cardiovascular: Normal rate and regular rhythm.  Pulmonary/Chest: Effort normal. No stridor. No respiratory distress.  Abdominal: He exhibits no distension.  Musculoskeletal: He exhibits no edema.  Neurological: He is alert and oriented  to person, place, and time.  Skin: Skin is warm and dry.  Psychiatric: He has a normal mood and affect.  Nursing note and vitals reviewed.    ED Treatments / Results    Radiology Dg Chest 2 View  Result Date: 09/21/2017 CLINICAL DATA:  Hemoptysis,  coughing up blood for 2 days EXAM: CHEST  2 VIEW COMPARISON:  None. FINDINGS: Normal mediastinum and cardiac silhouette. Normal pulmonary vasculature. No evidence of effusion, infiltrate, or pneumothorax. No acute bony abnormality. IMPRESSION: No acute cardiopulmonary process. Electronically Signed   By: Genevive Bi M.D.   On: 09/21/2017 15:03   Ct Chest Wo Contrast  Result Date: 09/21/2017 CLINICAL DATA:  Per ED notes: Pt reports was diagnosed with rib fractures x1 week  ago. Pt reports continued pain with movement and inspiration. Pt also reports blood tinged sputum,chills x2 days. Pt states he has 3 broken ribs on rt side s/p fall 1 week ago EXAM: CT CHEST WITHOUT CONTRAST TECHNIQUE: Multidetector CT imaging of the chest was performed following the standard protocol without IV contrast. COMPARISON:  Radiograph 09/21/2017 FINDINGS: Cardiovascular: No significant vascular findings. Normal heart size. No pericardial effusion. Mediastinum/Nodes: No axillary or supraclavicular adenopathy. No mediastinal hilar adenopathy. No pericardial fluid. Esophagus normal. Lungs/Pleura: There is mild airspace disease in the RIGHT lower lobe. Nodular airspace disease in the posterior aspect the RIGHT middle lobe (image 79, series 2). Small focus of airspace disease in the LEFT lower lobe additionally (image 78, series 4. No pneumothorax. Upper Abdomen: No evidence solid organ injury to the liver spleen. No free fluid. Musculoskeletal: There is nondisplaced fracture of the posterior RIGHT ninth rib. Mildly displaced fracture the posterior tenth rib the 11 twelfth rib are not imaged in entirety. No LEFT rib fractures. No scapular fracture sternal fracture. IMPRESSION: 1. Mildly displaced fracture of the posterior RIGHT ninth and tenth ribs. 2. Mild airspace disease in the RIGHT lower lobe and RIGHT middle lobe are favored pulmonary contusion. Small focus of airspace disease in the LEFT lower lobe additionally. 3. Patient has smoking history and the lesion in the RIGHT middle lobe is nodular. Recommend follow-up CT in 3 months to demonstrate resolution. 4. No pneumothorax. 5. No evidence of solid organ injury in the upper abdomen Electronically Signed   By: Genevive Bi M.D.   On: 09/21/2017 18:34    Procedures Procedures (including critical care time)  Medications Ordered in ED Medications  ketorolac (TORADOL) 30 MG/ML injection 15 mg (not administered)  albuterol (PROVENTIL) (2.5 MG/3ML)  0.083% nebulizer solution 5 mg (not administered)  dexamethasone (DECADRON) 10 MG/ML injection for Pediatric ORAL use 10 mg (not administered)   I reviewed the EMR, including xr from recent visit.  Initial Impression / Assessment and Plan / ED Course  I have reviewed the triage vital signs and the nursing notes.  Pertinent labs & imaging results that were available during my care of the patient were reviewed by me and considered in my medical decision making (see chart for details).  7:15 PM After CT scan, the patient is awake and alert, in no distress, speaking clearly. We discussed all findings including demonstration of multiple fractures, and evidence for pulmonary contusion. Patient is afebrile, awake and alert, with no respiratory compromise.  Pneumonia less likely given the absence of fever, and suspicion for pulmonary contusion Patient will be provided albuterol, incentive spirometer, additional analgesia, be discharged in stable condition with outpatient primary care follow-up.  Final Clinical Impressions(s) / ED Diagnoses  Hemoptysis Atypical chest  pain   Gerhard Munch, MD 09/21/17 [phone]

## 2018-03-24 ENCOUNTER — Emergency Department (HOSPITAL_COMMUNITY): Payer: Self-pay

## 2018-03-24 ENCOUNTER — Emergency Department (HOSPITAL_COMMUNITY)
Admission: EM | Admit: 2018-03-24 | Discharge: 2018-03-24 | Disposition: A | Discharge status: Home / Self Care | Payer: Self-pay | Attending: Emergency Medicine | Admitting: Emergency Medicine

## 2018-03-24 ENCOUNTER — Encounter (HOSPITAL_COMMUNITY): Payer: Self-pay | Admitting: Emergency Medicine

## 2018-03-24 ENCOUNTER — Other Ambulatory Visit: Payer: Self-pay

## 2018-03-24 DIAGNOSIS — J181 Lobar pneumonia, unspecified organism: Secondary | ICD-10-CM | POA: Insufficient documentation

## 2018-03-24 DIAGNOSIS — J189 Pneumonia, unspecified organism: Secondary | ICD-10-CM

## 2018-03-24 DIAGNOSIS — J45909 Unspecified asthma, uncomplicated: Secondary | ICD-10-CM | POA: Insufficient documentation

## 2018-03-24 DIAGNOSIS — R197 Diarrhea, unspecified: Secondary | ICD-10-CM | POA: Insufficient documentation

## 2018-03-24 DIAGNOSIS — F17210 Nicotine dependence, cigarettes, uncomplicated: Secondary | ICD-10-CM | POA: Insufficient documentation

## 2018-03-24 LAB — CBC WITH DIFFERENTIAL/PLATELET
Basophils Absolute: 0.1 10*3/uL (ref 0.0–0.1)
Basophils Relative: 1 %
Eosinophils Absolute: 0.3 10*3/uL (ref 0.0–0.7)
Eosinophils Relative: 3 %
HCT: 42 % (ref 39.0–52.0)
Hemoglobin: 14.6 g/dL (ref 13.0–17.0)
Lymphocytes Relative: 16 %
Lymphs Abs: 1.7 10*3/uL (ref 0.7–4.0)
MCH: 32 pg (ref 26.0–34.0)
MCHC: 34.8 g/dL (ref 30.0–36.0)
MCV: 92.1 fL (ref 78.0–100.0)
Monocytes Absolute: 1.2 10*3/uL — ABNORMAL HIGH (ref 0.1–1.0)
Monocytes Relative: 11 %
Neutro Abs: 7 10*3/uL (ref 1.7–7.7)
Neutrophils Relative %: 69 %
Platelets: 325 10*3/uL (ref 150–400)
RBC: 4.56 MIL/uL (ref 4.22–5.81)
RDW: 12.4 % (ref 11.5–15.5)
WBC: 10.1 10*3/uL (ref 4.0–10.5)

## 2018-03-24 LAB — COMPREHENSIVE METABOLIC PANEL
ALT: 40 U/L (ref 0–44)
AST: 24 U/L (ref 15–41)
Albumin: 4.1 g/dL (ref 3.5–5.0)
Alkaline Phosphatase: 65 U/L (ref 38–126)
Anion gap: 8 (ref 5–15)
BUN: 14 mg/dL (ref 6–20)
CO2: 26 mmol/L (ref 22–32)
Calcium: 9.3 mg/dL (ref 8.9–10.3)
Chloride: 104 mmol/L (ref 98–111)
Creatinine, Ser: 1.07 mg/dL (ref 0.61–1.24)
GFR calc Af Amer: >60 mL/min (ref >60)
GFR calc non Af Amer: >60 mL/min (ref >60)
Glucose, Bld: 102 mg/dL — ABNORMAL HIGH (ref 70–99)
Potassium: 3.7 mmol/L (ref 3.5–5.1)
Sodium: 138 mmol/L (ref 135–145)
Total Bilirubin: 1.2 mg/dL (ref 0.3–1.2)
Total Protein: 7.6 g/dL (ref 6.5–8.1)

## 2018-03-24 LAB — LIPASE, BLOOD: Lipase: 33 U/L (ref 11–51)

## 2018-03-24 MED ORDER — SODIUM CHLORIDE 0.9 % IV SOLN
1.0000 g | Freq: Once | INTRAVENOUS | Status: AC
  Administered 2018-03-24: 1 g via INTRAVENOUS
  Filled 2018-03-24: qty 10

## 2018-03-24 MED ORDER — ONDANSETRON HCL 4 MG/2ML IJ SOLN
4.0000 mg | Freq: Once | INTRAMUSCULAR | Status: AC
  Administered 2018-03-24: 4 mg via INTRAVENOUS
  Filled 2018-03-24: qty 2

## 2018-03-24 MED ORDER — SODIUM CHLORIDE 0.9 % IV BOLUS
1000.0000 mL | Freq: Once | INTRAVENOUS | Status: AC
  Administered 2018-03-24: 1000 mL via INTRAVENOUS

## 2018-03-24 MED ORDER — AZITHROMYCIN 250 MG PO TABS
500.0000 mg | ORAL_TABLET | Freq: Once | ORAL | Status: AC
  Administered 2018-03-24: 500 mg via ORAL
  Filled 2018-03-24: qty 2

## 2018-03-24 MED ORDER — BENZONATATE 100 MG PO CAPS: 200.0000 mg | ORAL_CAPSULE | Freq: Three times a day (TID) | ORAL | 0 refills | Status: DC | PRN

## 2018-03-24 MED ORDER — BENZONATATE 100 MG PO CAPS
200.0000 mg | ORAL_CAPSULE | Freq: Once | ORAL | Status: AC
  Administered 2018-03-24: 200 mg via ORAL
  Filled 2018-03-24: qty 2

## 2018-03-24 MED ORDER — AZITHROMYCIN 250 MG PO TABS: ORAL_TABLET | ORAL | 0 refills | Status: DC

## 2018-03-24 NOTE — ED Provider Notes (Signed)
[facility] EMERGENCY DEPARTMENT Provider Note   CSN: [id number] Arrival date & time: 03/24/18  1209     History   Chief Complaint Chief Complaint  Patient presents with  . Generalized Body Aches    HPI Andrew Hinton is a 42 y.o. male with a history of asthma and anxiety presenting with a 1 day history of a nonproductive cough, generalized body aches, subjective fever and 3 episodes of diarrhea which he woke with this morning.  He denies abdominal pain, he had mild nausea prior to arrival but no emesis.  He also endorses shortness of breath and wheezing which resolved after using his albuterol MDI.  He denies nasal congestion, rhinorrhea, sore throat.  He has been exposed to several coworkers with URI type symptoms recently.  He has taken Mucinex prior to arrival with no significant improvement in coughing.  The history is provided by the patient.    Past Medical History:  Diagnosis Date  . Anxiety   . Asthma   . Depression   . Nerve damage     There are no active problems to display for this patient.   Past Surgical History:  Procedure Laterality Date  . APPENDECTOMY          Home Medications    Prior to Admission medications   Medication Sig Start Date End Date Taking? Authorizing Provider  albuterol (PROVENTIL HFA;VENTOLIN HFA) 108 (90 Base) MCG/ACT inhaler Inhale 2 puffs into the lungs every 4 (four) hours as needed for wheezing or shortness of breath. 08/27/16   Lavera Guise, MD  azithromycin (ZITHROMAX Z-PAK) 250 MG tablet 1 tablet by mouth daily for 4 days. 03/24/18   Burgess Amor, PA-C  benzonatate (TESSALON) 100 MG capsule Take 2 capsules (200 mg total) by mouth 3 (three) times daily as needed. 03/24/18   Burgess Amor, PA-C  HYDROcodone-acetaminophen (NORCO/VICODIN) 5-325 MG tablet Take 1 tablet by mouth every 6 (six) hours as needed for severe pain. 09/21/17   Gerhard Munch, MD  loperamide (IMODIUM) 2 MG capsule Take 1 capsule (2 mg total) by mouth 4  (four) times daily as needed for diarrhea or loose stools. 08/27/16   Lavera Guise, MD  oxyCODONE-acetaminophen (PERCOCET/ROXICET) 5-325 MG tablet Take 1 tablet by mouth every 4 (four) hours as needed. 09/07/17   Triplett, Tammy, PA-C  predniSONE (DELTASONE) 20 MG tablet Take 2 tablets (40 mg total) by mouth daily with breakfast. For the next four days 09/21/17   Gerhard Munch, MD    Family History No family history on file.  Social History Social History   Tobacco Use  . Smoking status: Current Every Day Smoker    Packs/day: 0.50    Types: Cigarettes  . Smokeless tobacco: Never Used  Substance Use Topics  . Alcohol use: Yes    Comment: occasionally   . Drug use: No     Allergies   Patient has no known allergies.   Review of Systems Review of Systems  Constitutional: Positive for chills and fever.  HENT: Negative.  Negative for congestion, ear pain, rhinorrhea, sinus pressure, sore throat, trouble swallowing and voice change.   Eyes: Negative for discharge.  Respiratory: Positive for cough, shortness of breath and wheezing. Negative for stridor.   Cardiovascular: Negative for chest pain.  Gastrointestinal: Positive for diarrhea and nausea. Negative for abdominal pain and vomiting.  Genitourinary: Negative.      Physical Exam Updated Vital Signs BP (!) 139/91 (BP Location: Right Arm)   Pulse 71  Temp 98.3 F (36.8 C) (Oral)   Resp 17   Ht 6' (1.829 m)   Wt 83.9 kg   SpO2 100%   BMI 25.09 kg/m   Physical Exam  Constitutional: He appears well-developed and well-nourished.  HENT:  Head: Normocephalic and atraumatic.  Eyes: Conjunctivae are normal.  Neck: Normal range of motion.  Cardiovascular: Normal rate, regular rhythm, normal heart sounds and intact distal pulses.  Pulmonary/Chest: Effort normal. He has wheezes in the left lower field. He has rhonchi in the left lower field. He has no rales.  Sparse expiratory wheeze left base.  Abdominal: Soft. Bowel  sounds are normal. There is no tenderness.  Musculoskeletal: Normal range of motion.  Neurological: He is alert.  Skin: Skin is warm and dry.  Psychiatric: He has a normal mood and affect.  Nursing note and vitals reviewed.    ED Treatments / Results  Labs (all labs ordered are listed, but only abnormal results are displayed) Labs Reviewed  CBC WITH DIFFERENTIAL/PLATELET - Abnormal; Notable for the following components:      Result Value   Monocytes Absolute 1.2 (*)    All other components within normal limits  COMPREHENSIVE METABOLIC PANEL - Abnormal; Notable for the following components:   Glucose, Bld 102 (*)    All other components within normal limits  LIPASE, BLOOD    EKG None  Radiology Dg Chest 2 View  Result Date: 03/24/2018 CLINICAL DATA:  Generalized body aches and cough for 3 days EXAM: CHEST - 2 VIEW COMPARISON:  September 07, 2017 FINDINGS: The heart size and mediastinal contours are within normal limits. Patchy consolidation of left lung base is identified. There is no pulmonary edema or pleural effusion. The visualized skeletal structures are unremarkable. IMPRESSION: Left lung base pneumonia. Electronically Signed   By: Sherian Rein M.D.   On: 03/24/2018 14:01    Procedures Procedures (including critical care time)  Medications Ordered in ED Medications  sodium chloride 0.9 % bolus 1,000 mL (0 mLs Intravenous Stopped 03/24/18 1505)  benzonatate (TESSALON) capsule 200 mg (200 mg Oral Given 03/24/18 1331)  ondansetron (ZOFRAN) injection 4 mg (4 mg Intravenous Given 03/24/18 1330)  cefTRIAXone (ROCEPHIN) 1 g in sodium chloride 0.9 % 100 mL IVPB (0 g Intravenous Stopped 03/24/18 1505)  azithromycin (ZITHROMAX) tablet 500 mg (500 mg Oral Given 03/24/18 1549)     Initial Impression / Assessment and Plan / ED Course  I have reviewed the triage vital signs and the nursing notes.  Pertinent labs & imaging results that were available during my care of the patient were  reviewed by me and considered in my medical decision making (see chart for details).     Pt given IV fluids, tessalon, rocephin and started on his zithromax for CAP. No respiratory distress here, felt improved at dc. Return precautions discussed including increases sob, weakness or new sx. No diarrhea here, imodium prn.   Final Clinical Impressions(s) / ED Diagnoses   Final diagnoses:  Community acquired pneumonia of left lower lobe of lung (HCC)  Diarrhea, unspecified type    ED Discharge Orders         Ordered    azithromycin (ZITHROMAX Z-PAK) 250 MG tablet     03/24/18 1537    benzonatate (TESSALON) 100 MG capsule  3 times daily PRN     03/24/18 1537           Burgess Amor, PA-C 03/25/18 0604    Benjiman Core, MD 03/29/18 0004

## 2018-03-24 NOTE — ED Triage Notes (Signed)
Pt c/o generalized body aches, cough, and diarrhea x3 that began yesterday. Pt has taken Motrin and Mucinex for symptoms management with minimal relief.

## 2018-03-24 NOTE — Discharge Instructions (Signed)
Take your next dose of the antibiotic Zithromax tomorrow afternoon.  Rest to make sure you are drinking plenty of fluids.  Use your inhaler 2 puffs every 4 hours if needed for wheezing, cough or shortness of breath.  You have also been prescribed Tessalon which may also help with your cough symptom.  Return here for any new or worsening symptoms.  I recommend taking Imodium if your diarrhea returns or persists.

## 2018-03-27 ENCOUNTER — Emergency Department (HOSPITAL_COMMUNITY): Payer: Self-pay

## 2018-03-27 ENCOUNTER — Emergency Department (HOSPITAL_COMMUNITY)
Admission: EM | Admit: 2018-03-27 | Discharge: 2018-03-27 | Disposition: A | Discharge status: Home / Self Care | Payer: Self-pay | Attending: Emergency Medicine | Admitting: Emergency Medicine

## 2018-03-27 ENCOUNTER — Other Ambulatory Visit: Payer: Self-pay

## 2018-03-27 DIAGNOSIS — F17210 Nicotine dependence, cigarettes, uncomplicated: Secondary | ICD-10-CM | POA: Insufficient documentation

## 2018-03-27 DIAGNOSIS — J45909 Unspecified asthma, uncomplicated: Secondary | ICD-10-CM | POA: Insufficient documentation

## 2018-03-27 DIAGNOSIS — R531 Weakness: Secondary | ICD-10-CM | POA: Insufficient documentation

## 2018-03-27 DIAGNOSIS — Z79899 Other long term (current) drug therapy: Secondary | ICD-10-CM | POA: Insufficient documentation

## 2018-03-27 LAB — CBC WITH DIFFERENTIAL/PLATELET
Basophils Absolute: 0.1 10*3/uL (ref 0.0–0.1)
Basophils Relative: 1 %
Eosinophils Absolute: 0.5 10*3/uL (ref 0.0–0.7)
Eosinophils Relative: 6 %
HCT: 45.2 % (ref 39.0–52.0)
Hemoglobin: 15.7 g/dL (ref 13.0–17.0)
Lymphocytes Relative: 30 %
Lymphs Abs: 2.4 10*3/uL (ref 0.7–4.0)
MCH: 32.1 pg (ref 26.0–34.0)
MCHC: 34.7 g/dL (ref 30.0–36.0)
MCV: 92.4 fL (ref 78.0–100.0)
Monocytes Absolute: 0.6 10*3/uL (ref 0.1–1.0)
Monocytes Relative: 8 %
Neutro Abs: 4.5 10*3/uL (ref 1.7–7.7)
Neutrophils Relative %: 55 %
Platelets: 378 10*3/uL (ref 150–400)
RBC: 4.89 MIL/uL (ref 4.22–5.81)
RDW: 12.3 % (ref 11.5–15.5)
WBC: 8.1 10*3/uL (ref 4.0–10.5)

## 2018-03-27 LAB — COMPREHENSIVE METABOLIC PANEL
ALT: 88 U/L — ABNORMAL HIGH (ref 0–44)
AST: 53 U/L — ABNORMAL HIGH (ref 15–41)
Albumin: 4.3 g/dL (ref 3.5–5.0)
Alkaline Phosphatase: 73 U/L (ref 38–126)
Anion gap: 9 (ref 5–15)
BUN: 14 mg/dL (ref 6–20)
CO2: 25 mmol/L (ref 22–32)
Calcium: 9.7 mg/dL (ref 8.9–10.3)
Chloride: 104 mmol/L (ref 98–111)
Creatinine, Ser: 1.12 mg/dL (ref 0.61–1.24)
GFR calc Af Amer: >60 mL/min (ref >60)
GFR calc non Af Amer: >60 mL/min (ref >60)
Glucose, Bld: 95 mg/dL (ref 70–99)
Potassium: 4.2 mmol/L (ref 3.5–5.1)
Sodium: 138 mmol/L (ref 135–145)
Total Bilirubin: 0.6 mg/dL (ref 0.3–1.2)
Total Protein: 8.3 g/dL — ABNORMAL HIGH (ref 6.5–8.1)

## 2018-03-27 LAB — LACTIC ACID, PLASMA: Lactic Acid, Venous: 1.6 mmol/L (ref 0.5–1.9)

## 2018-03-27 MED ORDER — IPRATROPIUM-ALBUTEROL 0.5-2.5 (3) MG/3ML IN SOLN
3.0000 mL | Freq: Once | RESPIRATORY_TRACT | Status: AC
  Administered 2018-03-27: 3 mL via RESPIRATORY_TRACT
  Filled 2018-03-27: qty 3

## 2018-03-27 MED ORDER — ALBUTEROL SULFATE (2.5 MG/3ML) 0.083% IN NEBU
2.5000 mg | INHALATION_SOLUTION | Freq: Once | RESPIRATORY_TRACT | Status: AC
  Administered 2018-03-27: 2.5 mg via RESPIRATORY_TRACT
  Filled 2018-03-27: qty 3

## 2018-03-27 MED ORDER — SODIUM CHLORIDE 0.9 % IV BOLUS
1000.0000 mL | Freq: Once | INTRAVENOUS | Status: AC
  Administered 2018-03-27: 1000 mL via INTRAVENOUS

## 2018-03-27 NOTE — Discharge Instructions (Addendum)
Drink plenty of fluids.  Finish off your antibiotics.  Follow-up next week if any problems

## 2018-03-27 NOTE — ED Triage Notes (Signed)
Pt returns , here Monday dx with pneumonia, pt states he is not feeling any better. Decreased appetite, cough bloody sputum, diarreha.

## 2018-03-27 NOTE — ED Provider Notes (Signed)
[facility] EMERGENCY DEPARTMENT Provider Note   CSN: [id number] Arrival date & time: 03/27/18  1245     History   Chief Complaint Chief Complaint  Patient presents with  . Pneumonia    HPI Andrew Novacek is a 42 y.o. male.  Patient complains of weakness.  He was diagnosed with pneumonia earlier this week and has been taking his antibiotics.  No fever no chills  The history is provided by the patient. No language interpreter was used.  Pneumonia  This is a recurrent problem. The current episode started more than 2 days ago. The problem occurs constantly. The problem has not changed since onset.Pertinent negatives include no chest pain, no abdominal pain and no headaches. Nothing aggravates the symptoms. Nothing relieves the symptoms. He has tried nothing for the symptoms. The treatment provided no relief.    Past Medical History:  Diagnosis Date  . Anxiety   . Asthma   . Depression   . Nerve damage     There are no active problems to display for this patient.   Past Surgical History:  Procedure Laterality Date  . APPENDECTOMY          Home Medications    Prior to Admission medications   Medication Sig Start Date End Date Taking? Authorizing Provider  albuterol (PROVENTIL HFA;VENTOLIN HFA) 108 (90 Base) MCG/ACT inhaler Inhale 2 puffs into the lungs every 4 (four) hours as needed for wheezing or shortness of breath. 08/27/16  Yes Lavera Guise, MD  azithromycin (ZITHROMAX Z-PAK) 250 MG tablet 1 tablet by mouth daily for 4 days. Patient taking differently: Take 250 mg by mouth daily. 1 tablet by mouth daily for 4 days. 03/24/18  Yes Idol, Raynelle Fanning, PA-C  benzonatate (TESSALON) 100 MG capsule Take 2 capsules (200 mg total) by mouth 3 (three) times daily as needed. 03/24/18  Yes Idol, Raynelle Fanning, PA-C  loperamide (IMODIUM A-D) 2 MG tablet Take 2 mg by mouth 4 (four) times daily as needed for diarrhea or loose stools.   Yes [provider]    Family History No  family history on file.  Social History Social History   Tobacco Use  . Smoking status: Current Every Day Smoker    Packs/day: 0.50    Types: Cigarettes  . Smokeless tobacco: Never Used  Substance Use Topics  . Alcohol use: Yes    Comment: occasionally   . Drug use: No     Allergies   Patient has no known allergies.   Review of Systems Review of Systems  Constitutional: Positive for fatigue. Negative for appetite change.  HENT: Negative for congestion, ear discharge and sinus pressure.   Eyes: Negative for discharge.  Respiratory: Positive for cough.   Cardiovascular: Negative for chest pain.  Gastrointestinal: Negative for abdominal pain and diarrhea.  Genitourinary: Negative for frequency and hematuria.  Musculoskeletal: Negative for back pain.  Skin: Negative for rash.  Neurological: Negative for seizures and headaches.  Psychiatric/Behavioral: Negative for hallucinations.     Physical Exam Updated Vital Signs BP 135/90   Pulse 81   Temp 98.5 F (36.9 C) (Oral)   Resp 16   Ht 6' (1.829 m)   Wt 83.9 kg   SpO2 100%   BMI 25.09 kg/m   Physical Exam  Constitutional: He is oriented to person, place, and time. He appears well-developed.  HENT:  Head: Normocephalic.  Eyes: Conjunctivae are normal.  Neck: No tracheal deviation present.  Cardiovascular:  No murmur heard. Musculoskeletal: Normal range  of motion.  Neurological: He is oriented to person, place, and time.  Skin: Skin is warm.  Psychiatric: He has a normal mood and affect.     ED Treatments / Results  Labs (all labs ordered are listed, but only abnormal results are displayed) Labs Reviewed  COMPREHENSIVE METABOLIC PANEL - Abnormal; Notable for the following components:      Result Value   Total Protein 8.3 (*)    AST 53 (*)    ALT 88 (*)    All other components within normal limits  CBC WITH DIFFERENTIAL/PLATELET  LACTIC ACID, PLASMA    EKG None  Radiology Dg Chest 2  View  Result Date: 03/27/2018 CLINICAL DATA:  Fever and productive cough. Weakness. EXAM: CHEST - 2 VIEW COMPARISON:  03/24/2018. FINDINGS: Normal sized heart. Clear lungs. No patchy consolidation seen at the left lung base today. Mild peribronchial thickening. Unremarkable bones. IMPRESSION: Mild bronchitic changes.  No pneumonia seen today. Electronically Signed   By: Beckie Salts M.D.   On: 03/27/2018 16:01    Procedures Procedures (including critical care time)  Medications Ordered in ED Medications  sodium chloride 0.9 % bolus 1,000 mL (1,000 mLs Intravenous New Bag/Given 03/27/18 1621)  ipratropium-albuterol (DUONEB) 0.5-2.5 (3) MG/3ML nebulizer solution 3 mL (3 mLs Nebulization Given 03/27/18 1524)  albuterol (PROVENTIL) (2.5 MG/3ML) 0.083% nebulizer solution 2.5 mg (2.5 mg Nebulization Given 03/27/18 1524)     Initial Impression / Assessment and Plan / ED Course  I have reviewed the triage vital signs and the nursing notes.  Pertinent labs & imaging results that were available during my care of the patient were reviewed by me and considered in my medical decision making (see chart for details).     Labs unremarkable.  Chest x-ray shows improving pneumonia.  Patient will be discharged home and will continue his antibiotics drink plenty of fluids and follow-up if not improving.  He will continue to drink plenty fluids Final Clinical Impressions(s) / ED Diagnoses   Final diagnoses:  Weakness    ED Discharge Orders    None       Bethann Berkshire, MD 03/27/18 1656

## 2018-11-06 ENCOUNTER — Emergency Department (HOSPITAL_COMMUNITY)
Admission: EM | Admit: 2018-11-06 | Discharge: 2018-11-06 | Disposition: A | Discharge status: Home / Self Care | Payer: Self-pay | Attending: Emergency Medicine | Admitting: Emergency Medicine

## 2018-11-06 ENCOUNTER — Encounter (HOSPITAL_COMMUNITY): Payer: Self-pay

## 2018-11-06 ENCOUNTER — Other Ambulatory Visit: Payer: Self-pay

## 2018-11-06 DIAGNOSIS — F17210 Nicotine dependence, cigarettes, uncomplicated: Secondary | ICD-10-CM | POA: Insufficient documentation

## 2018-11-06 DIAGNOSIS — J45909 Unspecified asthma, uncomplicated: Secondary | ICD-10-CM | POA: Insufficient documentation

## 2018-11-06 DIAGNOSIS — R112 Nausea with vomiting, unspecified: Secondary | ICD-10-CM | POA: Insufficient documentation

## 2018-11-06 DIAGNOSIS — R197 Diarrhea, unspecified: Secondary | ICD-10-CM | POA: Insufficient documentation

## 2018-11-06 LAB — COMPREHENSIVE METABOLIC PANEL
ALT: 85 U/L — ABNORMAL HIGH (ref 0–44)
AST: 48 U/L — ABNORMAL HIGH (ref 15–41)
Albumin: 4.1 g/dL (ref 3.5–5.0)
Alkaline Phosphatase: 56 U/L (ref 38–126)
Anion gap: 9 (ref 5–15)
BUN: 18 mg/dL (ref 6–20)
CO2: 23 mmol/L (ref 22–32)
Calcium: 9 mg/dL (ref 8.9–10.3)
Chloride: 104 mmol/L (ref 98–111)
Creatinine, Ser: 1.03 mg/dL (ref 0.61–1.24)
GFR calc Af Amer: >60 mL/min (ref >60)
GFR calc non Af Amer: >60 mL/min (ref >60)
Glucose, Bld: 95 mg/dL (ref 70–99)
Potassium: 4.1 mmol/L (ref 3.5–5.1)
Sodium: 136 mmol/L (ref 135–145)
Total Bilirubin: 0.6 mg/dL (ref 0.3–1.2)
Total Protein: 7.4 g/dL (ref 6.5–8.1)

## 2018-11-06 LAB — CBC WITH DIFFERENTIAL/PLATELET
Abs Immature Granulocytes: 0.02 10*3/uL (ref 0.00–0.07)
Basophils Absolute: 0.1 10*3/uL (ref 0.0–0.1)
Basophils Relative: 2 %
Eosinophils Absolute: 0.8 10*3/uL — ABNORMAL HIGH (ref 0.0–0.5)
Eosinophils Relative: 10 %
HCT: 47.1 % (ref 39.0–52.0)
Hemoglobin: 15.7 g/dL (ref 13.0–17.0)
Immature Granulocytes: 0 %
Lymphocytes Relative: 28 %
Lymphs Abs: 2 10*3/uL (ref 0.7–4.0)
MCH: 31 pg (ref 26.0–34.0)
MCHC: 33.3 g/dL (ref 30.0–36.0)
MCV: 92.9 fL (ref 80.0–100.0)
Monocytes Absolute: 0.6 10*3/uL (ref 0.1–1.0)
Monocytes Relative: 8 %
Neutro Abs: 3.8 10*3/uL (ref 1.7–7.7)
Neutrophils Relative %: 52 %
Platelets: 382 10*3/uL (ref 150–400)
RBC: 5.07 MIL/uL (ref 4.22–5.81)
RDW: 12.2 % (ref 11.5–15.5)
WBC: 7.4 10*3/uL (ref 4.0–10.5)
nRBC: 0 % (ref 0.0–0.2)

## 2018-11-06 LAB — URINALYSIS, ROUTINE W REFLEX MICROSCOPIC
Bilirubin Urine: NEGATIVE
Glucose, UA: NEGATIVE mg/dL
Hgb urine dipstick: NEGATIVE
Ketones, ur: NEGATIVE mg/dL
Leukocytes,Ua: NEGATIVE
Nitrite: NEGATIVE
Protein, ur: NEGATIVE mg/dL
Specific Gravity, Urine: 1.011 (ref 1.005–1.030)
pH: 5 (ref 5.0–8.0)

## 2018-11-06 LAB — INFLUENZA PANEL BY PCR (TYPE A & B)
Influenza A By PCR: NEGATIVE
Influenza B By PCR: NEGATIVE

## 2018-11-06 MED ORDER — PROMETHAZINE HCL 25 MG PO TABS: 25.0000 mg | ORAL_TABLET | Freq: Four times a day (QID) | ORAL | 0 refills | Status: DC | PRN

## 2018-11-06 MED ORDER — SODIUM CHLORIDE 0.9 % IV BOLUS: 1000.0000 mL | Freq: Once | INTRAVENOUS | Status: DC

## 2018-11-06 MED ORDER — SODIUM CHLORIDE 0.9 % IV BOLUS
1000.0000 mL | Freq: Once | INTRAVENOUS | Status: AC
Start: 2018-11-06 — End: 2018-11-06
  Administered 2018-11-06: 1000 mL via INTRAVENOUS

## 2018-11-06 NOTE — Discharge Instructions (Addendum)
Rest and make sure you are continuing to hydrate.  You may continue using your Imodium if needed for any return of diarrhea.  I have prescribed Phenergan if needed for any return of nausea or vomiting.  Your lab tests and exam today are reassuring.  You have to liver enzyme test that are mildly elevated today and it is recommended that you have these rechecked within a week to ensure they are returning to normal.  This may be from your recent symptoms, but will be important to know that they have resolved.  You may go to the [facility] clinic listed above for this test.  I recommend maintaining a bland diet for the next day, if your symptoms are improving you may advance to a regular diet as tolerated.  Return here for any worsening symptoms.

## 2018-11-06 NOTE — ED Triage Notes (Signed)
Pt presents to ED with complaints of body aches, chills, and fever started on Monday. Pt states he had N/V also on Monday. Pt denies cough.

## 2018-11-06 NOTE — ED Provider Notes (Signed)
[facility] EMERGENCY DEPARTMENT Provider Note   CSN: [id number] Arrival date & time: 11/06/18  1214    History   Chief Complaint Chief Complaint  Patient presents with  . Flu Like Symptoms    HPI Andrew Hinton is a 43 y.o. male with a history significant for distant asthma presenting with a 4-day history of flulike symptoms.  He describes developing generalized body aches along with fevers and chills which started on Monday.  He reports also nausea with emesis and diarrhea which have resolved as of yesterday with maintaining an n.p.o. status and taking Imodium for diarrhea symptoms.  He reports abdominal cramping which improves after a bowel movement, no persistent abdominal pain.  He reports persistent weakness, generalized fatigue and continued body aches.  His last attempt at p.o. intake was yesterday evening which caused an episode of diarrhea.  He has had nothing to eat today.  He denies any other family members with similar symptoms.  He has been out of work for the past week so no outside contacts.  He denies cough, chest pain or shortness of breath.  He has had reduced urine production, stating he has only urinated once today and only twice yesterday.     The history is provided by the patient.    Past Medical History:  Diagnosis Date  . Anxiety   . Asthma   . Depression   . Nerve damage     There are no active problems to display for this patient.   Past Surgical History:  Procedure Laterality Date  . APPENDECTOMY          Home Medications    Prior to Admission medications   Medication Sig Start Date End Date Taking? Authorizing Provider  ibuprofen (MOTRIN IB) 200 MG tablet Take 200 mg by mouth every 6 (six) hours as needed for fever or moderate pain.   Yes [provider]  Phenyleph-Doxylamine-DM-APAP (NYQUIL SEVERE COLD/FLU) 5-6.25-10-325 MG/15ML LIQD Take 15 mLs by mouth daily as needed (FOR FLU LIKE SYMPTOMS).   Yes [provider]  promethazine (PHENERGAN) 25 MG tablet Take 1 tablet (25 mg total) by mouth every 6 (six) hours as needed for nausea or vomiting. 11/06/18   Burgess Amor, PA-C    Family History No family history on file.  Social History Social History   Tobacco Use  . Smoking status: Current Every Day Smoker    Packs/day: 0.50    Types: Cigarettes  . Smokeless tobacco: Never Used  Substance Use Topics  . Alcohol use: Yes    Comment: occasionally   . Drug use: No     Allergies   Patient has no known allergies.   Review of Systems Review of Systems  Constitutional: Positive for fatigue and fever.  HENT: Negative for congestion and sore throat.   Eyes: Negative.   Respiratory: Negative for chest tightness and shortness of breath.   Cardiovascular: Negative for chest pain.  Gastrointestinal: Positive for abdominal pain, diarrhea, nausea and vomiting.  Genitourinary: Negative.   Musculoskeletal: Positive for myalgias. Negative for arthralgias, joint swelling and neck pain.  Skin: Negative.  Negative for rash and wound.  Neurological: Negative for dizziness, weakness, light-headedness, numbness and headaches.  Psychiatric/Behavioral: Negative.      Physical Exam Updated Vital Signs BP (!) 130/94 (BP Location: Left Arm)   Pulse 78   Temp 97.7 F (36.5 C) (Oral)   Resp 12   Ht 6' (1.829 m)   Wt 91.8 kg  SpO2 100%   BMI 27.44 kg/m   Physical Exam Vitals signs and nursing note reviewed.  Constitutional:      General: He is not in acute distress.    Appearance: He is well-developed.     Comments: Appears fatigued.  HENT:     Head: Normocephalic and atraumatic.     Mouth/Throat:     Mouth: Mucous membranes are dry.  Eyes:     Conjunctiva/sclera: Conjunctivae normal.  Neck:     Musculoskeletal: Normal range of motion.  Cardiovascular:     Rate and Rhythm: Normal rate and regular rhythm.     Heart sounds: Normal heart sounds.  Pulmonary:     Effort: Pulmonary effort is  normal.     Breath sounds: Normal breath sounds. No wheezing.  Abdominal:     General: Bowel sounds are increased. There is no distension.     Palpations: Abdomen is soft. There is no fluid wave.     Tenderness: There is generalized abdominal tenderness.     Hernia: No hernia is present.     Comments: Mild generalized abdominal discomfort without guarding or rebound.  Abdomen is soft.  Musculoskeletal: Normal range of motion.  Skin:    General: Skin is warm and dry.  Neurological:     Mental Status: He is alert.      ED Treatments / Results  Labs (all labs ordered are listed, but only abnormal results are displayed) Labs Reviewed  CBC WITH DIFFERENTIAL/PLATELET - Abnormal; Notable for the following components:      Result Value   Eosinophils Absolute 0.8 (*)    All other components within normal limits  COMPREHENSIVE METABOLIC PANEL - Abnormal; Notable for the following components:   AST 48 (*)    ALT 85 (*)    All other components within normal limits  URINALYSIS, ROUTINE W REFLEX MICROSCOPIC  INFLUENZA PANEL BY PCR (TYPE A & B)    EKG None  Radiology No results found.  Procedures Procedures (including critical care time)  Medications Ordered in ED Medications  sodium chloride 0.9 % bolus 1,000 mL (0 mLs Intravenous Stopped 11/06/18 1654)     Initial Impression / Assessment and Plan / ED Course  I have reviewed the triage vital signs and the nursing notes.  Pertinent labs & imaging results that were available during my care of the patient were reviewed by me and considered in my medical decision making (see chart for details).        At re-exam, pt feels improved.  He was given IV fluids here and also tolerated PO fluids and a snack without worsened sx.  Abd soft, nontender, non acute abd.  LFT's slight elevation. No ruq ttp specifically.  He was advised he needs a recheck of this lab test in a week.  Suspect viral gastroenteritis.  He is influenza negative  today.  He has no respiratory sx except for nasal congestion. Doubt Covid 19.  Advised bland diet, increased fluid intake.  Rest.  Return precautions discussed.   Final Clinical Impressions(s) / ED Diagnoses   Final diagnoses:  Nausea and vomiting, intractability of vomiting not specified, unspecified vomiting type  Diarrhea of presumed infectious origin    ED Discharge Orders         Ordered    promethazine (PHENERGAN) 25 MG tablet  Every 6 hours PRN     11/06/18 1748           Burgess Amor, PA-C 11/06/18 1753  Loren Racer, MD 11/10/18 2241

## 2019-08-25 ENCOUNTER — Emergency Department (HOSPITAL_COMMUNITY)
Admission: EM | Admit: 2019-08-25 | Discharge: 2019-08-25 | Disposition: A | Discharge status: Home / Self Care | Payer: Medicaid Other | Attending: Emergency Medicine | Admitting: Emergency Medicine

## 2019-08-25 ENCOUNTER — Emergency Department (HOSPITAL_COMMUNITY): Payer: Medicaid Other

## 2019-08-25 ENCOUNTER — Encounter (HOSPITAL_COMMUNITY): Payer: Self-pay | Admitting: Emergency Medicine

## 2019-08-25 ENCOUNTER — Other Ambulatory Visit: Payer: Self-pay

## 2019-08-25 DIAGNOSIS — Z79899 Other long term (current) drug therapy: Secondary | ICD-10-CM | POA: Insufficient documentation

## 2019-08-25 DIAGNOSIS — R1110 Vomiting, unspecified: Secondary | ICD-10-CM | POA: Diagnosis present

## 2019-08-25 DIAGNOSIS — R112 Nausea with vomiting, unspecified: Secondary | ICD-10-CM | POA: Insufficient documentation

## 2019-08-25 DIAGNOSIS — J45909 Unspecified asthma, uncomplicated: Secondary | ICD-10-CM | POA: Insufficient documentation

## 2019-08-25 DIAGNOSIS — R1032 Left lower quadrant pain: Secondary | ICD-10-CM | POA: Insufficient documentation

## 2019-08-25 DIAGNOSIS — R197 Diarrhea, unspecified: Secondary | ICD-10-CM | POA: Diagnosis not present

## 2019-08-25 DIAGNOSIS — Z20822 Contact with and (suspected) exposure to covid-19: Secondary | ICD-10-CM | POA: Insufficient documentation

## 2019-08-25 DIAGNOSIS — F17210 Nicotine dependence, cigarettes, uncomplicated: Secondary | ICD-10-CM | POA: Diagnosis not present

## 2019-08-25 LAB — URINALYSIS, ROUTINE W REFLEX MICROSCOPIC
Bacteria, UA: NONE SEEN
Bilirubin Urine: NEGATIVE
Glucose, UA: NEGATIVE mg/dL
Hgb urine dipstick: NEGATIVE
Ketones, ur: NEGATIVE mg/dL
Leukocytes,Ua: NEGATIVE
Nitrite: NEGATIVE
Protein, ur: 30 mg/dL — AB
Specific Gravity, Urine: 1.023 (ref 1.005–1.030)
pH: 5 (ref 5.0–8.0)

## 2019-08-25 LAB — COMPREHENSIVE METABOLIC PANEL
ALT: 82 U/L — ABNORMAL HIGH (ref 0–44)
AST: 47 U/L — ABNORMAL HIGH (ref 15–41)
Albumin: 4.3 g/dL (ref 3.5–5.0)
Alkaline Phosphatase: 60 U/L (ref 38–126)
Anion gap: 9 (ref 5–15)
BUN: 20 mg/dL (ref 6–20)
CO2: 24 mmol/L (ref 22–32)
Calcium: 9.3 mg/dL (ref 8.9–10.3)
Chloride: 104 mmol/L (ref 98–111)
Creatinine, Ser: 1.15 mg/dL (ref 0.61–1.24)
GFR calc Af Amer: >60 mL/min (ref >60)
GFR calc non Af Amer: >60 mL/min (ref >60)
Glucose, Bld: 105 mg/dL — ABNORMAL HIGH (ref 70–99)
Potassium: 3.9 mmol/L (ref 3.5–5.1)
Sodium: 137 mmol/L (ref 135–145)
Total Bilirubin: 1.1 mg/dL (ref 0.3–1.2)
Total Protein: 7.7 g/dL (ref 6.5–8.1)

## 2019-08-25 LAB — CBC
HCT: 46.6 % (ref 39.0–52.0)
Hemoglobin: 15.7 g/dL (ref 13.0–17.0)
MCH: 32 pg (ref 26.0–34.0)
MCHC: 33.7 g/dL (ref 30.0–36.0)
MCV: 95.1 fL (ref 80.0–100.0)
Platelets: 357 10*3/uL (ref 150–400)
RBC: 4.9 MIL/uL (ref 4.22–5.81)
RDW: 12.1 % (ref 11.5–15.5)
WBC: 8.7 10*3/uL (ref 4.0–10.5)
nRBC: 0 % (ref 0.0–0.2)

## 2019-08-25 MED ORDER — SODIUM CHLORIDE 0.9 % IV BOLUS
1000.0000 mL | Freq: Once | INTRAVENOUS | Status: AC
  Administered 2019-08-25: 21:00:00 1000 mL via INTRAVENOUS

## 2019-08-25 MED ORDER — MORPHINE SULFATE (PF) 4 MG/ML IV SOLN
4.0000 mg | Freq: Once | INTRAVENOUS | Status: AC
  Administered 2019-08-25: 4 mg via INTRAVENOUS
  Filled 2019-08-25: qty 1

## 2019-08-25 MED ORDER — DICYCLOMINE HCL 20 MG PO TABS: 20.0000 mg | ORAL_TABLET | Freq: Two times a day (BID) | ORAL | 0 refills | Status: DC

## 2019-08-25 MED ORDER — ONDANSETRON 4 MG PO TBDP: 4.0000 mg | ORAL_TABLET | Freq: Three times a day (TID) | ORAL | 0 refills | Status: DC | PRN

## 2019-08-25 MED ORDER — IOHEXOL 300 MG/ML  SOLN
100.0000 mL | Freq: Once | INTRAMUSCULAR | Status: AC | PRN
  Administered 2019-08-25: 100 mL via INTRAVENOUS

## 2019-08-25 MED ORDER — ONDANSETRON HCL 4 MG/2ML IJ SOLN
4.0000 mg | Freq: Once | INTRAMUSCULAR | Status: AC
  Administered 2019-08-25: 4 mg via INTRAVENOUS
  Filled 2019-08-25: qty 2

## 2019-08-25 NOTE — Discharge Instructions (Addendum)
Nausea, Vomiting, and Diarrhea  Hand washing: Wash your hands throughout the day, but especially before and after touching the face, using the restroom, sneezing, coughing, or touching surfaces that have been coughed or sneezed upon. Hydration: Symptoms will be intensified and complicated by dehydration. Dehydration can also extend the duration of symptoms. Drink plenty of fluids and get plenty of rest. You should be drinking at least half a liter of water an hour to stay hydrated. Electrolyte drinks (ex. Gatorade, Powerade, Pedialyte) are also encouraged. You should be drinking enough fluids to make your urine light yellow, almost clear. If this is not the case, you are not drinking enough water. Please note that some of the treatments indicated below will not be effective if you are not adequately hydrated. Diet: Please concentrate on hydration, however, you may introduce food slowly.  Start with a clear liquid diet, progressed to a full liquid diet, and then bland solids as you are able. Pain or fever: Ibuprofen, Naproxen, or Tylenol for pain or fever.  Nausea/vomiting: Use the ondansetron (generic for Zofran) for nausea or vomiting.  This medication may not prevent all vomiting or nausea, but can help facilitate better hydration. Things that can help with nausea/vomiting also include peppermint/menthol candies, vitamin B12, and ginger. Diarrhea: May use medications such as loperamide (Imodium) or Bismuth subsalicylate (Pepto-Bismol). Bentyl: This medication is what is known as an antispasmodic and is intended to help reduce abdominal discomfort. Follow-up: Follow-up with a primary care provider on this matter. Return: Return should you develop a fever, bloody diarrhea, increased abdominal pain, uncontrolled vomiting, or any other major concerns.  For prescription assistance, may try using prescription discount sites or apps, such as goodrx.com 

## 2019-08-25 NOTE — ED Provider Notes (Addendum)
[facility] EMERGENCY DEPARTMENT Provider Note   CSN: [id number] Arrival date & time: 08/25/19  1338     History Chief Complaint  Patient presents with  . Emesis    Andrew Hinton is a 44 y.o. male.  HPI      Andrew Hinton is a 44 y.o. male, with a history of alcohol abuse, asthma, presenting to the ED with abdominal pain beginning yesterday.  Pain is left lower quadrant, squeezing in nature, 6-7/10, nonradiating, worsening since onset.  Early this morning, began to have nausea, vomiting, and diarrhea. Denies fever/chills, hematochezia/melena, hematemesis, urinary symptoms, cough, shortness of breath, chest pain, acute back pain, or any other complaints.     Past Medical History:  Diagnosis Date  . Anxiety   . Asthma   . Depression   . Nerve damage     There are no problems to display for this patient.   Past Surgical History:  Procedure Laterality Date  . APPENDECTOMY         History reviewed. No pertinent family history.  Social History   Tobacco Use  . Smoking status: Current Every Day Smoker    Packs/day: 0.50    Types: Cigarettes  . Smokeless tobacco: Never Used  Substance Use Topics  . Alcohol use: Yes    Comment: occasionally   . Drug use: No    Home Medications Prior to Admission medications   Medication Sig Start Date End Date Taking? Authorizing Provider  dicyclomine (BENTYL) 20 MG tablet Take 1 tablet (20 mg total) by mouth 2 (two) times daily. 08/25/19   Anetra Czerwinski C, PA-C  ibuprofen (MOTRIN IB) 200 MG tablet Take 200 mg by mouth every 6 (six) hours as needed for fever or moderate pain.    [provider]  ondansetron (ZOFRAN ODT) 4 MG disintegrating tablet Take 1 tablet (4 mg total) by mouth every 8 (eight) hours as needed for nausea or vomiting. 08/25/19   Nettie Cromwell C, PA-C  Phenyleph-Doxylamine-DM-APAP (NYQUIL SEVERE COLD/FLU) 5-6.25-10-325 MG/15ML LIQD Take 15 mLs by mouth daily as needed (FOR FLU LIKE SYMPTOMS).     [provider]  promethazine (PHENERGAN) 25 MG tablet Take 1 tablet (25 mg total) by mouth every 6 (six) hours as needed for nausea or vomiting. 11/06/18   Burgess Amor, PA-C    Allergies    Patient has no known allergies.  Review of Systems   Review of Systems  Constitutional: Negative for chills, diaphoresis and fever.  Respiratory: Negative for cough and shortness of breath.   Cardiovascular: Negative for chest pain.  Gastrointestinal: Positive for abdominal pain, diarrhea, nausea and vomiting. Negative for blood in stool.  Genitourinary: Negative for dysuria, flank pain, frequency and hematuria.  Musculoskeletal: Negative for back pain.  Neurological: Negative for syncope.  All other systems reviewed and are negative.   Physical Exam Updated Vital Signs BP (!) 145/100 (BP Location: Right Arm)   Pulse (!) 105   Temp 98.4 F (36.9 C) (Oral)   Resp 18   Ht 6' (1.829 m)   Wt 92.5 kg   SpO2 99%   BMI 27.67 kg/m   Physical Exam Vitals and nursing note reviewed.  Constitutional:      General: He is not in acute distress.    Appearance: He is well-developed. He is not diaphoretic.  HENT:     Head: Normocephalic and atraumatic.     Mouth/Throat:     Mouth: Mucous membranes are moist.     Pharynx:  Oropharynx is clear.  Eyes:     Conjunctiva/sclera: Conjunctivae normal.  Cardiovascular:     Rate and Rhythm: Normal rate and regular rhythm.     Pulses: Normal pulses.          Radial pulses are 2+ on the right side and 2+ on the left side.       Posterior tibial pulses are 2+ on the right side and 2+ on the left side.     Heart sounds: Normal heart sounds.     Comments: Tactile temperature in the extremities appropriate and equal bilaterally. Not tachycardic on my exam. Pulmonary:     Effort: Pulmonary effort is normal. No respiratory distress.     Breath sounds: Normal breath sounds.  Abdominal:     Palpations: Abdomen is soft.     Tenderness: There is  abdominal tenderness. There is no guarding.    Musculoskeletal:     Cervical back: Neck supple.     Right lower leg: No edema.     Left lower leg: No edema.  Lymphadenopathy:     Cervical: No cervical adenopathy.  Skin:    General: Skin is warm and dry.  Neurological:     Mental Status: He is alert.  Psychiatric:        Mood and Affect: Mood and affect normal.        Speech: Speech normal.        Behavior: Behavior normal.     ED Results / Procedures / Treatments   Labs (all labs ordered are listed, but only abnormal results are displayed) Labs Reviewed  COMPREHENSIVE METABOLIC PANEL - Abnormal; Notable for the following components:      Result Value   Glucose, Bld 105 (*)    AST 47 (*)    ALT 82 (*)    All other components within normal limits  URINALYSIS, ROUTINE W REFLEX MICROSCOPIC - Abnormal; Notable for the following components:   Protein, ur 30 (*)    All other components within normal limits  NOVEL CORONAVIRUS, NAA (HOSP ORDER, SEND-OUT TO REF LAB; TAT 18-24 HRS)  CBC    EKG None  Radiology CT ABDOMEN PELVIS W CONTRAST  Result Date: 08/25/2019 CLINICAL DATA:  44 year old male with left lower quadrant abdominal pain. EXAM: CT ABDOMEN AND PELVIS WITH CONTRAST TECHNIQUE: Multidetector CT imaging of the abdomen and pelvis was performed using the standard protocol following bolus administration of intravenous contrast. CONTRAST:  OMNIPAQUE IOHEXOL 300 MG/ML  SOLN COMPARISON:  None. FINDINGS: Lower chest: The visualized lung bases are clear. No intra-abdominal free air or free fluid. Hepatobiliary: Diffuse fatty infiltration of the liver. No intrahepatic biliary ductal dilatation. The gallbladder is unremarkable. Pancreas: Unremarkable. No pancreatic ductal dilatation or surrounding inflammatory changes. Spleen: Normal in size without focal abnormality. Adrenals/Urinary Tract: The adrenal glands are unremarkable. There is no hydronephrosis on either side. There is  symmetric enhancement and excretion of contrast by both kidneys. The visualized ureters and urinary bladder appear unremarkable. Stomach/Bowel: There is diverticulosis of the descending colon without active inflammatory changes. There is no bowel obstruction or active inflammation. Appendectomy. Vascular/Lymphatic: The abdominal aorta and IVC are unremarkable. No portal venous gas. There is no adenopathy. Reproductive: The prostate and seminal vesicles are grossly unremarkable. No pelvic mass. Other: Small fat containing umbilical hernia. Musculoskeletal: No acute or significant osseous findings. IMPRESSION: 1. No acute intra-abdominopelvic pathology. 2. Colonic diverticulosis. No bowel obstruction or active inflammation. 3. Fatty liver. Electronically Signed   By: Burtis Junes  Radparvar M.D.   On: 08/25/2019 21:48    Procedures Procedures (including critical care time)  Medications Ordered in ED Medications  morphine 4 MG/ML injection 4 mg (4 mg Intravenous Given 08/25/19 2124)  ondansetron (ZOFRAN) injection 4 mg (4 mg Intravenous Given 08/25/19 2124)  sodium chloride 0.9 % bolus 1,000 mL (1,000 mLs Intravenous New Bag/Given 08/25/19 2125)  iohexol (OMNIPAQUE) 300 MG/ML solution 100 mL (100 mLs Intravenous Contrast Given 08/25/19 2132)    ED Course  I have reviewed the triage vital signs and the nursing notes.  Pertinent labs & imaging results that were available during my care of the patient were reviewed by me and considered in my medical decision making (see chart for details).    MDM Rules/Calculators/A&P                      Patient presents with abdominal pain, vomiting, and diarrhea. Patient is nontoxic appearing, afebrile, not tachycardic on my exam, not tachypneic, not hypotensive, maintains excellent SPO2 on room air, and is in no apparent distress.  CT without acute abnormality.  Patient tolerating PO at time of discharge. The patient was given instructions for home care as well as return  precautions. Patient voices understanding of these instructions, accepts the plan, and is comfortable with discharge.   Final Clinical Impression(s) / ED Diagnoses Final diagnoses:  Left lower quadrant abdominal pain  Nausea vomiting and diarrhea    Rx / DC Orders ED Discharge Orders         Ordered    dicyclomine (BENTYL) 20 MG tablet  2 times daily     08/25/19 2158    ondansetron (ZOFRAN ODT) 4 MG disintegrating tablet  Every 8 hours PRN     08/25/19 2158           Lorayne Bender, PA-C 08/25/19 2148    Lorayne Bender, PA-C 08/25/19 2201    Margette Fast, MD 08/26/19 1200

## 2019-08-25 NOTE — ED Triage Notes (Signed)
Pt reports emesis since 3am this am. Pt also reports diarrhea and lower abd pain as the day progresses.

## 2019-08-27 LAB — NOVEL CORONAVIRUS, NAA (HOSP ORDER, SEND-OUT TO REF LAB; TAT 18-24 HRS): SARS-CoV-2, NAA: NOT DETECTED

## 2019-08-30 ENCOUNTER — Telehealth: Payer: Self-pay

## 2019-08-30 NOTE — Telephone Encounter (Signed)
Received call from patient checking Covid results.  Advised results negative.   

## 2019-08-31 ENCOUNTER — Telehealth: Payer: Self-pay

## 2019-08-31 NOTE — Telephone Encounter (Signed)
Pt called to get a copy of covid test results faxed to his job at [phone] attention: Candie Echevaria   Sent request to nursing triage line.   Joycelyn Rua Hopkins

## 2019-08-31 NOTE — Telephone Encounter (Signed)
Result faxed as requested. 

## 2019-10-08 ENCOUNTER — Emergency Department (HOSPITAL_COMMUNITY): Payer: Medicaid Other

## 2019-10-08 ENCOUNTER — Other Ambulatory Visit: Payer: Self-pay

## 2019-10-08 ENCOUNTER — Encounter (HOSPITAL_COMMUNITY): Payer: Self-pay | Admitting: Emergency Medicine

## 2019-10-08 ENCOUNTER — Inpatient Hospital Stay (HOSPITAL_COMMUNITY)
Admission: EM | Admit: 2019-10-08 | Discharge: 2019-10-14 | DRG: 082 | Disposition: A | Discharge status: Home / Self Care | Payer: Medicaid Other | Attending: Internal Medicine | Admitting: Internal Medicine

## 2019-10-08 DIAGNOSIS — D500 Iron deficiency anemia secondary to blood loss (chronic): Secondary | ICD-10-CM

## 2019-10-08 DIAGNOSIS — W109XXA Fall (on) (from) unspecified stairs and steps, initial encounter: Secondary | ICD-10-CM | POA: Diagnosis present

## 2019-10-08 DIAGNOSIS — Z8249 Family history of ischemic heart disease and other diseases of the circulatory system: Secondary | ICD-10-CM

## 2019-10-08 DIAGNOSIS — E860 Dehydration: Secondary | ICD-10-CM | POA: Diagnosis present

## 2019-10-08 DIAGNOSIS — Z87891 Personal history of nicotine dependence: Secondary | ICD-10-CM | POA: Diagnosis not present

## 2019-10-08 DIAGNOSIS — I5021 Acute systolic (congestive) heart failure: Secondary | ICD-10-CM | POA: Diagnosis present

## 2019-10-08 DIAGNOSIS — S066X9A Traumatic subarachnoid hemorrhage with loss of consciousness of unspecified duration, initial encounter: Secondary | ICD-10-CM | POA: Diagnosis present

## 2019-10-08 DIAGNOSIS — R55 Syncope and collapse: Secondary | ICD-10-CM

## 2019-10-08 DIAGNOSIS — S300XXA Contusion of lower back and pelvis, initial encounter: Secondary | ICD-10-CM | POA: Diagnosis present

## 2019-10-08 DIAGNOSIS — J45909 Unspecified asthma, uncomplicated: Secondary | ICD-10-CM | POA: Diagnosis present

## 2019-10-08 DIAGNOSIS — Z20822 Contact with and (suspected) exposure to covid-19: Secondary | ICD-10-CM | POA: Diagnosis present

## 2019-10-08 DIAGNOSIS — R42 Dizziness and giddiness: Secondary | ICD-10-CM | POA: Diagnosis present

## 2019-10-08 DIAGNOSIS — I609 Nontraumatic subarachnoid hemorrhage, unspecified: Secondary | ICD-10-CM

## 2019-10-08 DIAGNOSIS — S0219XA Other fracture of base of skull, initial encounter for closed fracture: Secondary | ICD-10-CM | POA: Diagnosis present

## 2019-10-08 DIAGNOSIS — W19XXXA Unspecified fall, initial encounter: Secondary | ICD-10-CM

## 2019-10-08 DIAGNOSIS — F1010 Alcohol abuse, uncomplicated: Secondary | ICD-10-CM | POA: Diagnosis present

## 2019-10-08 DIAGNOSIS — N179 Acute kidney failure, unspecified: Secondary | ICD-10-CM

## 2019-10-08 DIAGNOSIS — I428 Other cardiomyopathies: Secondary | ICD-10-CM | POA: Diagnosis present

## 2019-10-08 DIAGNOSIS — Y92009 Unspecified place in unspecified non-institutional (private) residence as the place of occurrence of the external cause: Secondary | ICD-10-CM

## 2019-10-08 LAB — CBC WITH DIFFERENTIAL/PLATELET
Abs Immature Granulocytes: 0.03 10*3/uL (ref 0.00–0.07)
Basophils Absolute: 0.1 10*3/uL (ref 0.0–0.1)
Basophils Relative: 1 %
Eosinophils Absolute: 0.3 10*3/uL (ref 0.0–0.5)
Eosinophils Relative: 4 %
HCT: 42.3 % (ref 39.0–52.0)
Hemoglobin: 14 g/dL (ref 13.0–17.0)
Immature Granulocytes: 0 %
Lymphocytes Relative: 15 %
Lymphs Abs: 1.1 10*3/uL (ref 0.7–4.0)
MCH: 31.9 pg (ref 26.0–34.0)
MCHC: 33.1 g/dL (ref 30.0–36.0)
MCV: 96.4 fL (ref 80.0–100.0)
Monocytes Absolute: 0.7 10*3/uL (ref 0.1–1.0)
Monocytes Relative: 9 %
Neutro Abs: 5.4 10*3/uL (ref 1.7–7.7)
Neutrophils Relative %: 71 %
Platelets: 332 10*3/uL (ref 150–400)
RBC: 4.39 MIL/uL (ref 4.22–5.81)
RDW: 12.4 % (ref 11.5–15.5)
WBC: 7.6 10*3/uL (ref 4.0–10.5)
nRBC: 0 % (ref 0.0–0.2)

## 2019-10-08 LAB — COMPREHENSIVE METABOLIC PANEL
ALT: 86 U/L — ABNORMAL HIGH (ref 0–44)
AST: 50 U/L — ABNORMAL HIGH (ref 15–41)
Albumin: 4.1 g/dL (ref 3.5–5.0)
Alkaline Phosphatase: 51 U/L (ref 38–126)
Anion gap: 10 (ref 5–15)
BUN: 18 mg/dL (ref 6–20)
CO2: 23 mmol/L (ref 22–32)
Calcium: 8.8 mg/dL — ABNORMAL LOW (ref 8.9–10.3)
Chloride: 108 mmol/L (ref 98–111)
Creatinine, Ser: 0.94 mg/dL (ref 0.61–1.24)
GFR calc Af Amer: >60 mL/min (ref >60)
GFR calc non Af Amer: >60 mL/min (ref >60)
Glucose, Bld: 102 mg/dL — ABNORMAL HIGH (ref 70–99)
Potassium: 4.1 mmol/L (ref 3.5–5.1)
Sodium: 141 mmol/L (ref 135–145)
Total Bilirubin: 0.6 mg/dL (ref 0.3–1.2)
Total Protein: 7.5 g/dL (ref 6.5–8.1)

## 2019-10-08 LAB — ETHANOL: Alcohol, Ethyl (B): <10 mg/dL (ref <10)

## 2019-10-08 LAB — RAPID URINE DRUG SCREEN, HOSP PERFORMED
Amphetamines: NOT DETECTED
Barbiturates: NOT DETECTED
Benzodiazepines: NOT DETECTED
Cocaine: NOT DETECTED
Opiates: NOT DETECTED
Tetrahydrocannabinol: POSITIVE — AB

## 2019-10-08 LAB — TROPONIN I (HIGH SENSITIVITY)
Troponin I (High Sensitivity): 11 ng/L (ref <18)
Troponin I (High Sensitivity): 11 ng/L (ref <18)

## 2019-10-08 LAB — MAGNESIUM: Magnesium: 2.2 mg/dL (ref 1.7–2.4)

## 2019-10-08 LAB — RESPIRATORY PANEL BY RT PCR (FLU A&B, COVID)
Influenza A by PCR: NEGATIVE
Influenza B by PCR: NEGATIVE
SARS Coronavirus 2 by RT PCR: NEGATIVE

## 2019-10-08 LAB — TSH: TSH: 0.778 u[IU]/mL (ref 0.350–4.500)

## 2019-10-08 LAB — HIV ANTIBODY (ROUTINE TESTING W REFLEX): HIV Screen 4th Generation wRfx: NONREACTIVE

## 2019-10-08 MED ORDER — HYDROCODONE-ACETAMINOPHEN 5-325 MG PO TABS
1.0000 | ORAL_TABLET | Freq: Once | ORAL | Status: AC
  Administered 2019-10-08: 1 via ORAL
  Filled 2019-10-08: qty 1

## 2019-10-08 MED ORDER — ACETAMINOPHEN 325 MG PO TABS: 650.0000 mg | ORAL_TABLET | Freq: Four times a day (QID) | ORAL | Status: DC | PRN

## 2019-10-08 MED ORDER — IOHEXOL 350 MG/ML SOLN
100.0000 mL | Freq: Once | INTRAVENOUS | Status: AC | PRN
  Administered 2019-10-08: 75 mL via INTRAVENOUS

## 2019-10-08 MED ORDER — ONDANSETRON HCL 4 MG PO TABS
4.0000 mg | ORAL_TABLET | Freq: Four times a day (QID) | ORAL | Status: DC | PRN
  Administered 2019-10-11: 4 mg via ORAL
  Filled 2019-10-08: qty 1

## 2019-10-08 MED ORDER — ACETAMINOPHEN 650 MG RE SUPP: 650.0000 mg | Freq: Four times a day (QID) | RECTAL | Status: DC | PRN

## 2019-10-08 MED ORDER — ONDANSETRON HCL 4 MG/2ML IJ SOLN
4.0000 mg | Freq: Once | INTRAMUSCULAR | Status: AC
  Administered 2019-10-08: 4 mg via INTRAVENOUS
  Filled 2019-10-08: qty 2

## 2019-10-08 MED ORDER — POLYETHYLENE GLYCOL 3350 17 G PO PACK: 17.0000 g | PACK | Freq: Every day | ORAL | Status: DC | PRN

## 2019-10-08 MED ORDER — IOHEXOL 300 MG/ML  SOLN
100.0000 mL | Freq: Once | INTRAMUSCULAR | Status: AC | PRN
  Administered 2019-10-08: 100 mL via INTRAVENOUS

## 2019-10-08 MED ORDER — MORPHINE SULFATE (PF) 2 MG/ML IV SOLN
2.0000 mg | Freq: Once | INTRAVENOUS | Status: AC
  Administered 2019-10-08: 2 mg via INTRAVENOUS
  Filled 2019-10-08: qty 1

## 2019-10-08 MED ORDER — ONDANSETRON HCL 4 MG/2ML IJ SOLN: 4.0000 mg | Freq: Four times a day (QID) | INTRAMUSCULAR | Status: DC | PRN

## 2019-10-08 MED ORDER — MORPHINE SULFATE (PF) 2 MG/ML IV SOLN
2.0000 mg | INTRAVENOUS | Status: DC | PRN
  Administered 2019-10-08 – 2019-10-14 (×21): 2 mg via INTRAVENOUS
  Filled 2019-10-08 (×21): qty 1

## 2019-10-08 NOTE — ED Provider Notes (Signed)
[facility] EMERGENCY DEPARTMENT Provider Note   CSN: [id number] Arrival date & time: 10/08/19  2683     History Chief Complaint  Patient presents with  . Loss of Consciousness    Andrew Hinton is a 44 y.o. male.  Patient states that he fell down a flight of stairs.  He got to the top of the stairs and then just passed out.  Patient complains of a headache.  He is not sure how long he was unconscious  The history is provided by the patient. No language interpreter was used.  Loss of Consciousness Episode history:  Single Most recent episode:  Today Timing:  Constant Progression:  Worsening Chronicity:  New Context: not blood draw   Witnessed: no   Relieved by:  Nothing Associated symptoms: no chest pain, no headaches and no seizures        Past Medical History:  Diagnosis Date  . Anxiety   . Asthma   . Depression   . Nerve damage     There are no problems to display for this patient.   Past Surgical History:  Procedure Laterality Date  . APPENDECTOMY         History reviewed. No pertinent family history.  Social History   Tobacco Use  . Smoking status: Former Smoker    Packs/day: 0.50    Types: Cigarettes    Quit date: 10/07/2017    Years since quitting: 2.0  . Smokeless tobacco: Never Used  Substance Use Topics  . Alcohol use: Yes    Comment: occasionally   . Drug use: No    Home Medications Prior to Admission medications   Medication Sig Start Date End Date Taking? Authorizing Provider  dicyclomine (BENTYL) 20 MG tablet Take 1 tablet (20 mg total) by mouth 2 (two) times daily. Patient not taking: Reported on 10/08/2019 08/25/19   Lorayne Bender, PA-C  ibuprofen (MOTRIN IB) 200 MG tablet Take 200 mg by mouth every 6 (six) hours as needed for fever or moderate pain.    [provider]  ondansetron (ZOFRAN ODT) 4 MG disintegrating tablet Take 1 tablet (4 mg total) by mouth every 8 (eight) hours as needed for nausea or  vomiting. Patient not taking: Reported on 10/08/2019 08/25/19   Joy, Raquel Sarna C, PA-C  Phenyleph-Doxylamine-DM-APAP (NYQUIL SEVERE COLD/FLU) 5-6.25-10-325 MG/15ML LIQD Take 15 mLs by mouth daily as needed (FOR FLU LIKE SYMPTOMS).    [provider]  promethazine (PHENERGAN) 25 MG tablet Take 1 tablet (25 mg total) by mouth every 6 (six) hours as needed for nausea or vomiting. Patient not taking: Reported on 10/08/2019 11/06/18   Evalee Jefferson, PA-C    Allergies    Patient has no known allergies.  Review of Systems   Review of Systems  Constitutional: Negative for appetite change and fatigue.  HENT: Negative for congestion, ear discharge and sinus pressure.   Eyes: Negative for discharge.  Respiratory: Negative for cough.   Cardiovascular: Positive for syncope. Negative for chest pain.  Gastrointestinal: Negative for abdominal pain and diarrhea.  Genitourinary: Negative for frequency and hematuria.  Musculoskeletal: Negative for back pain.       Headache  Skin: Negative for rash.  Neurological: Negative for seizures and headaches.  Psychiatric/Behavioral: Negative for hallucinations.    Physical Exam Updated Vital Signs BP (!) 141/112   Pulse 77   Temp 98.3 F (36.8 C) (Oral)   Resp 11   Ht 6' (1.829 m)   Wt 93 kg  SpO2 98%   BMI 27.80 kg/m   Physical Exam Vitals and nursing note reviewed.  Constitutional:      Appearance: He is well-developed.  HENT:     Head: Normocephalic.     Comments: Tenderness to left forearm    Nose: Nose normal.  Eyes:     General: No scleral icterus.    Conjunctiva/sclera: Conjunctivae normal.  Neck:     Thyroid: No thyromegaly.  Cardiovascular:     Rate and Rhythm: Normal rate and regular rhythm.     Heart sounds: No murmur. No friction rub. No gallop.   Pulmonary:     Breath sounds: No stridor. No wheezing or rales.  Chest:     Chest wall: No tenderness.  Abdominal:     General: There is no distension.     Tenderness: There is  no abdominal tenderness. There is no rebound.  Musculoskeletal:        General: Normal range of motion.     Cervical back: Neck supple.  Lymphadenopathy:     Cervical: No cervical adenopathy.  Skin:    Findings: No erythema or rash.  Neurological:     Mental Status: He is oriented to person, place, and time.     Motor: No abnormal muscle tone.     Coordination: Coordination normal.  Psychiatric:        Behavior: Behavior normal.     ED Results / Procedures / Treatments   Labs (all labs ordered are listed, but only abnormal results are displayed) Labs Reviewed  COMPREHENSIVE METABOLIC PANEL - Abnormal; Notable for the following components:      Result Value   Glucose, Bld 102 (*)    Calcium 8.8 (*)    AST 50 (*)    ALT 86 (*)    All other components within normal limits  RESPIRATORY PANEL BY RT PCR (FLU A&B, COVID)  CBC WITH DIFFERENTIAL/PLATELET  TROPONIN I (HIGH SENSITIVITY)  TROPONIN I (HIGH SENSITIVITY)    EKG None  Radiology CT ANGIO HEAD W OR WO CONTRAST  Result Date: 10/08/2019 CLINICAL DATA:  Larey Seat down steps.  Subarachnoid hemorrhage. EXAM: CT ANGIOGRAPHY HEAD AND NECK TECHNIQUE: Multidetector CT imaging of the head and neck was performed using the standard protocol during bolus administration of intravenous contrast. Multiplanar CT image reconstructions and MIPs were obtained to evaluate the vascular anatomy. Carotid stenosis measurements (when applicable) are obtained utilizing NASCET criteria, using the distal internal carotid diameter as the denominator. CONTRAST:  75mL OMNIPAQUE IOHEXOL 350 MG/ML SOLN COMPARISON:  CT head earlier today FINDINGS: CT HEAD FINDINGS Brain: Small volume subarachnoid hemorrhage on the right is stable. No subdural hemorrhage. No midline shift. Basilar cisterns and suprasellar cistern negative for hemorrhage. Ventricle size normal. Negative for acute infarct or mass. Vascular: Negative for hyperdense vessel Skull: Nondepressed fracture  left temporal bone squamosal portion, unchanged from the prior study. Overlying soft tissue swelling in the left parietal scalp. Sinuses: Extensive mucosal edema throughout the paranasal sinuses unchanged. Negative orbit. Orbits: Negative orbit Review of the MIP images confirms the above findings NECK FINDINGS Aortic arch: Normal aortic arch. Bovine branching pattern. Proximal great vessels normal. Right carotid system: Normal right carotid. Negative for atherosclerotic disease or dissection. Left carotid system: Normal left carotid. Negative for atherosclerotic disease or dissection Vertebral arteries: Both vertebral arteries are normal and widely patent to the basilar Skeleton: Negative for cervical spine fracture. Poor dentition with multiple caries Other neck: Negative for mass or soft tissue swelling in the  neck. Upper chest: Lung apices clear bilaterally. Review of the MIP images confirms the above findings CTA HEAD FINDINGS Anterior circulation: Cavernous carotid widely patent and normal bilaterally. No stenosis or atherosclerotic disease. Negative for aneurysm. Anterior and middle cerebral arteries normal bilaterally without stenosis or aneurysm. Posterior circulation: Both vertebral arteries patent to the basilar. PICA patent bilaterally. Basilar widely patent. Superior cerebellar and posterior cerebral arteries widely patent bilaterally. Negative for aneurysm. Venous sinuses: Normal venous enhancement. Anatomic variants: None Review of the MIP images confirms the above findings IMPRESSION: 1. Small volume subarachnoid hemorrhage on the right is stable. No new hemorrhage identified. 2. Nondisplaced fracture left temporal bone with overlying soft tissue swelling in the scalp. Findings suggest significant head trauma with contrecoup subarachnoid hemorrhage on the right. 3. Negative for cerebral aneurysm 4. No significant intracranial or extracranial arterial stenosis. 5. These results were called by telephone  at the time of interpretation on 10/08/2019 at 1:40 pm to provider Girtrude Enslin , who verbally acknowledged these results. Electronically Signed   By: Marlan Palau M.D.   On: 10/08/2019 13:42   DG Chest 1 View  Result Date: 10/08/2019 CLINICAL DATA:  Fall EXAM: CHEST  1 VIEW COMPARISON:  03/27/2018 FINDINGS: The heart size and mediastinal contours are within normal limits. Both lungs are clear. The visualized skeletal structures are unremarkable. IMPRESSION: No acute abnormality of the lungs in AP portable projection. Electronically Signed   By: Lauralyn Primes M.D.   On: 10/08/2019 10:13   CT Head Wo Contrast  Result Date: 10/08/2019 CLINICAL DATA:  Larey Seat down steps. Loss of consciousness. EXAM: CT HEAD WITHOUT CONTRAST CT CERVICAL SPINE WITHOUT CONTRAST TECHNIQUE: Multidetector CT imaging of the head and cervical spine was performed following the standard protocol without intravenous contrast. Multiplanar CT image reconstructions of the cervical spine were also generated. COMPARISON:  None. FINDINGS: CT HEAD FINDINGS Brain: Acute subarachnoid hemorrhage is seen in the right temporal lobe and temporoparietal region, well demonstrated on axial images 13, 14 of series 2 and coronal images 36 through 39 of series 4. No midline shift. No hydrocephalus. No mass lesion evident. Vascular: No hyperdense vessel or unexpected calcification. Skull: No evidence for fracture. No worrisome lytic or sclerotic lesion. Chronic deformity noted in the mandibular condyles bilaterally with bilateral TMJ subluxation. Sinuses/Orbits: Near complete opacification noted in the bilateral frontal sinuses, bilateral ethmoid air cells, and right maxillary sinus. Extensive chronic mucosal disease noted in the left maxillary sinus. Mastoid air cells are clear bilaterally. Other: None. CT CERVICAL SPINE FINDINGS Alignment: Normal. Skull base and vertebrae: No acute fracture. No primary bone lesion or focal pathologic process. Soft tissues  and spinal canal: No prevertebral fluid or swelling. No visible canal hematoma. Disc levels:  Preserved throughout. Upper chest: Unremarkable. Other: None. IMPRESSION: 1. Acute subarachnoid hemorrhage identified in the right temporal and temporoparietal region. No associated mass effect or midline shift. 2. No cervical spine fracture. 3. Chronic paranasal sinusitis. 4. Chronic deformity of the mandibular condyles bilaterally, potentially posttraumatic or degenerative with subluxation of the bilateral temporomandibular joints. Critical Value/emergent results were called by telephone at the time of interpretation on 10/08/2019 at 10:52 am to provider [facility] Leontyne Manville , who verbally acknowledged these results. Electronically Signed   By: Kennith Center M.D.   On: 10/08/2019 10:53   CT Angio Neck W and/or Wo Contrast  Result Date: 10/08/2019 CLINICAL DATA:  Larey Seat down steps.  Subarachnoid hemorrhage. EXAM: CT ANGIOGRAPHY HEAD AND NECK TECHNIQUE: Multidetector CT imaging of the head and  neck was performed using the standard protocol during bolus administration of intravenous contrast. Multiplanar CT image reconstructions and MIPs were obtained to evaluate the vascular anatomy. Carotid stenosis measurements (when applicable) are obtained utilizing NASCET criteria, using the distal internal carotid diameter as the denominator. CONTRAST:  59mL OMNIPAQUE IOHEXOL 350 MG/ML SOLN COMPARISON:  CT head earlier today FINDINGS: CT HEAD FINDINGS Brain: Small volume subarachnoid hemorrhage on the right is stable. No subdural hemorrhage. No midline shift. Basilar cisterns and suprasellar cistern negative for hemorrhage. Ventricle size normal. Negative for acute infarct or mass. Vascular: Negative for hyperdense vessel Skull: Nondepressed fracture left temporal bone squamosal portion, unchanged from the prior study. Overlying soft tissue swelling in the left parietal scalp. Sinuses: Extensive mucosal edema throughout the paranasal  sinuses unchanged. Negative orbit. Orbits: Negative orbit Review of the MIP images confirms the above findings NECK FINDINGS Aortic arch: Normal aortic arch. Bovine branching pattern. Proximal great vessels normal. Right carotid system: Normal right carotid. Negative for atherosclerotic disease or dissection. Left carotid system: Normal left carotid. Negative for atherosclerotic disease or dissection Vertebral arteries: Both vertebral arteries are normal and widely patent to the basilar Skeleton: Negative for cervical spine fracture. Poor dentition with multiple caries Other neck: Negative for mass or soft tissue swelling in the neck. Upper chest: Lung apices clear bilaterally. Review of the MIP images confirms the above findings CTA HEAD FINDINGS Anterior circulation: Cavernous carotid widely patent and normal bilaterally. No stenosis or atherosclerotic disease. Negative for aneurysm. Anterior and middle cerebral arteries normal bilaterally without stenosis or aneurysm. Posterior circulation: Both vertebral arteries patent to the basilar. PICA patent bilaterally. Basilar widely patent. Superior cerebellar and posterior cerebral arteries widely patent bilaterally. Negative for aneurysm. Venous sinuses: Normal venous enhancement. Anatomic variants: None Review of the MIP images confirms the above findings IMPRESSION: 1. Small volume subarachnoid hemorrhage on the right is stable. No new hemorrhage identified. 2. Nondisplaced fracture left temporal bone with overlying soft tissue swelling in the scalp. Findings suggest significant head trauma with contrecoup subarachnoid hemorrhage on the right. 3. Negative for cerebral aneurysm 4. No significant intracranial or extracranial arterial stenosis. 5. These results were called by telephone at the time of interpretation on 10/08/2019 at 1:40 pm to provider Davon Abdelaziz , who verbally acknowledged these results. Electronically Signed   By: Marlan Palau M.D.   On:  10/08/2019 13:42   CT Chest W Contrast  Result Date: 10/08/2019 CLINICAL DATA:  Fall, left-sided rib pain. History of appendectomy EXAM: CT CHEST, ABDOMEN, AND PELVIS WITH CONTRAST TECHNIQUE: Multidetector CT imaging of the chest, abdomen and pelvis was performed following the standard protocol during bolus administration of intravenous contrast. CONTRAST:  OMNIPAQUE IOHEXOL 300 MG/ML  SOLN COMPARISON:  CT chest 09/21/2017, CT abdomen-pelvis 08/25/2019 FINDINGS: CT CHEST FINDINGS Cardiovascular: Normal heart size. No pericardial effusion. Thoracic aorta is normal in course and caliber. Pulmonary trunk is nondilated. No significant vascular findings identified. Mediastinum/Nodes: No enlarged mediastinal, hilar, or axillary lymph nodes. Thyroid gland, trachea, and esophagus demonstrate no significant findings. Lungs/Pleura: Lungs are clear. No pleural effusion or pneumothorax. Musculoskeletal: Healed fractures of the posterior right eighth, ninth, and tenth ribs. Normal spinal alignment. Vertebral body heights are maintained. No acute osseous findings. No chest wall hematoma or other abnormality. CT ABDOMEN PELVIS FINDINGS Hepatobiliary: Diffusely decreased attenuation of the hepatic parenchyma compatible with hepatic steatosis. No focal hepatic injury or pericapsular collection. Gallbladder unremarkable. No gallstone or biliary dilatation. Pancreas: Unremarkable. No pancreatic ductal dilatation or surrounding inflammatory changes.  Spleen: No splenic injury or perisplenic hematoma. Adrenals/Urinary Tract: No adrenal hemorrhage or renal injury identified. Bladder is unremarkable. Stomach/Bowel: Stomach is within normal limits. Appendix surgically absent. Scattered colonic diverticulosis. No evidence of bowel wall thickening, distention, or inflammatory changes. Vascular/Lymphatic: No significant vascular findings are present. No enlarged abdominal or pelvic lymph nodes. Reproductive: Prostate is unremarkable.  Other: Small fat containing umbilical hernia. No abdominopelvic ascites. Musculoskeletal: Induration within the subcutaneous tissues overlying the left hip and gluteal region with focal hyperdense fluid collection measuring 5.6 x 1.7 x 6.5 cm (volume = 32 cm^3) suggestive of a hematoma (series 2, image 117). Pelvic bony ring is intact without fracture. Bilateral hip joints intact without fracture or dislocation. Lumbar vertebral body heights are maintained without fracture or static listhesis. Intervertebral disc height loss of L5-S1. IMPRESSION: 1. Soft tissue hematoma overlying the left hip/gluteal region measuring up to 6.5 cm. 2. No acute osseous abnormality. 3. No acute findings within the chest. No solid organ or visceral injury within the abdomen or pelvis. 4. Hepatic steatosis. Electronically Signed   By: Nicholas  Plundo D.O.   On: 10/08/2019 10:59   CT Cervical Spine Wo Contrast  Result Date: 10/08/2019 CLINICAL DATA:  Fell down steps. Loss of consciousness. EXAM: CT HEAD WITHOUT CONTRAST CT CERVICAL SPINE WITHOUT CONTRAST TECHNIQUE: Multidetector CT imaging of the head and cervical spine was performed following the standard protocol without intravenous contrast. Multiplanar CT image reconstructions of the cervical spine were also generated. COMPARISON:  None. FINDINGS: CT HEAD FINDINGS Brain: Acute subarachnoid hemorrhage is seen in the right temporal lobe and temporoparietal region, well demonstrated on axial images 13, 14 of series 2 and coronal images 36 through 39 of series 4. No midline shift. No hydrocephalus. No mass lesion evident. Vascular: No hyperdense vessel or unexpected calcification. Skull: No evidence for fracture. No worrisome lytic or sclerotic lesion. Chronic deformity noted in the mandibular condyles bilaterally with bilateral TMJ subluxation. Sinuses/Orbits: Near complete opacification noted in the bilateral frontal sinuses, bilateral ethmoid air cells, and right maxillary sinus.  Extensive chronic mucosal disease noted in the left maxillary sinus. Mastoid air cells are clear bilaterally. Other: None. CT CERVICAL SPINE FINDINGS Alignment: Normal. Skull base and vertebrae: No acute fracture. No primary bone lesion or focal pathologic process. Soft tissues and spinal canal: No prevertebral fluid or swelling. No visible canal hematoma. Disc levels:  Preserved throughout. Upper chest: Unremarkable. Other: None. IMPRESSION: 1. Acute subarachnoid hemorrhage identified in the right temporal and temporoparietal region. No associated mass effect or midline shift. 2. No cervical spine fracture. 3. Chronic paranasal sinusitis. 4. Chronic deformity of the mandibular condyles bilaterally, potentially posttraumatic or degenerative with subluxation of the bilateral temporomandibular joints. Critical Value/emergent results were called by telephone at the time of interpretation on 10/08/2019 at 10:52 am to provider Gladyce Mcray , who verbally acknowledged these results. Electronically Signed   By: Eric  Mansell M.D.   On: 10/08/2019 10:53   CT ABDOMEN PELVIS W CONTRAST  Result Date: 10/08/2019 CLINICAL DATA:  Fall, left-sided rib pain. History of appendectomy EXAM: CT CHEST, ABDOMEN, AND PELVIS WITH CONTRAST TECHNIQUE: Multidetector CT imaging of the chest, abdomen and pelvis was performed following the standard protocol during bolus administration of intravenous contrast. CONTRAST:  <MEASUREM<MEASUREMEN<MEASUREMEN T> OMNIPAQUE IOHEXOL 300 MG/ML  SOLN COMPARISON:  CT chest 09/21/2017, CT abdomen-pelvis 08/25/2019 FINDINGS: CT CHEST FINDINGS Cardiovascular: Normal heart size. No pericardial effusion. Thoracic aorta is normal in course and caliber. Pulmonary trunk is nondilated. No significant vascular findings identified. Mediastinum/Nodes:  No enlarged mediastinal, hilar, or axillary lymph nodes. Thyroid gland, trachea, and esophagus demonstrate no significant findings. Lungs/Pleura: Lungs are clear. No pleural effusion or  pneumothorax. Musculoskeletal: Healed fractures of the posterior right eighth, ninth, and tenth ribs. Normal spinal alignment. Vertebral body heights are maintained. No acute osseous findings. No chest wall hematoma or other abnormality. CT ABDOMEN PELVIS FINDINGS Hepatobiliary: Diffusely decreased attenuation of the hepatic parenchyma compatible with hepatic steatosis. No focal hepatic injury or pericapsular collection. Gallbladder unremarkable. No gallstone or biliary dilatation. Pancreas: Unremarkable. No pancreatic ductal dilatation or surrounding inflammatory changes. Spleen: No splenic injury or perisplenic hematoma. Adrenals/Urinary Tract: No adrenal hemorrhage or renal injury identified. Bladder is unremarkable. Stomach/Bowel: Stomach is within normal limits. Appendix surgically absent. Scattered colonic diverticulosis. No evidence of bowel wall thickening, distention, or inflammatory changes. Vascular/Lymphatic: No significant vascular findings are present. No enlarged abdominal or pelvic lymph nodes. Reproductive: Prostate is unremarkable. Other: Small fat containing umbilical hernia. No abdominopelvic ascites. Musculoskeletal: Induration within the subcutaneous tissues overlying the left hip and gluteal region with focal hyperdense fluid collection measuring 5.6 x 1.7 x 6.5 cm (volume = 32 cm^3) suggestive of a hematoma (series 2, image 117). Pelvic bony ring is intact without fracture. Bilateral hip joints intact without fracture or dislocation. Lumbar vertebral body heights are maintained without fracture or static listhesis. Intervertebral disc height loss of L5-S1. IMPRESSION: 1. Soft tissue hematoma overlying the left hip/gluteal region measuring up to 6.5 cm. 2. No acute osseous abnormality. 3. No acute findings within the chest. No solid organ or visceral injury within the abdomen or pelvis. 4. Hepatic steatosis. Electronically Signed   By: Duanne Guess D.O.   On: 10/08/2019 10:59   DG  Shoulder Left  Result Date: 10/08/2019 CLINICAL DATA:  Fall, pain EXAM: LEFT SHOULDER - 2+ VIEW COMPARISON:  None. FINDINGS: There is no evidence of fracture or dislocation. There is no evidence of arthropathy or other focal bone abnormality. Soft tissues are unremarkable. IMPRESSION: No fracture or dislocation of the left shoulder. Joint spaces are preserved. Electronically Signed   By: Lauralyn Primes M.D.   On: 10/08/2019 10:11    Procedures Procedures (including critical care time)  Medications Ordered in ED Medications  HYDROcodone-acetaminophen (NORCO/VICODIN) 5-325 MG per tablet 1 tablet (has no administration in time range)  iohexol (OMNIPAQUE) 300 MG/ML solution 100 mL (100 mLs Intravenous Contrast Given 10/08/19 1006)  iohexol (OMNIPAQUE) 350 MG/ML injection 100 mL (75 mLs Intravenous Contrast Given 10/08/19 1250)    ED Course  I have reviewed the triage vital signs and the nursing notes.  Pertinent labs & imaging results that were available during my care of the patient were reviewed by me and considered in my medical decision making (see chart for details). CRITICAL CARE Performed by: Bethann Berkshire Total critical care time: 35  minutes Critical care time was exclusive of separately billable procedures and treating other patients. Critical care was necessary to treat or prevent imminent or life-threatening deterioration. Critical care was time spent personally by me on the following activities: development of treatment plan with patient and/or surrogate as well as nursing, discussions with consultants, evaluation of patient's response to treatment, examination of patient, obtaining history from patient or surrogate, ordering and performing treatments and interventions, ordering and review of laboratory studies, ordering and review of radiographic studies, pulse oximetry and re-evaluation of patient's condition.    MDM Rules/Calculators/A&P  CT scan shows  subarachnoid hemorrhage.  We did a CT angio that did not show any aneurysms.  I spoke with neurosurgery and they stated the patient does not need surgery and they recommended admission overnight and repeat scan and observation. Final Clinical Impression(s) / ED Diagnoses Final diagnoses:  Fall    Rx / DC Orders ED Discharge Orders    None       Bethann Berkshire, MD 10/08/19 1510

## 2019-10-08 NOTE — H&P (Signed)
History and Physical    Andrew Hinton TKP:[id number] DOB: 01/08/1976 DOA: 10/08/2019  PCP: Patient, No Pcp Per   Patient coming from: Home  I have personally briefly reviewed patient's old medical records in [facility]  Chief Complaint: Syncope, and fall  HPI: Andrew Hinton is a 44 y.o. male with medical history significant for anxiety asthma depression. Patient was getting ready for work this morning when he opened his basement steps, he was standing at the top of the basement and the next thing he wakes up at the bottom of the steps.  The last thing he remembers is being at the top of the stairs.  He woke up to his 54 year old son shouting and trying to shake him awake.  He thinks he might have been out for about 10 minutes. He reports he woke up this morning feeling a bit different.  When he was standing at the top of the stairs, he reports a lightheaded feeling, seeing spots like he was going to pass out. He reports while here in the hospital he has had similar feeling of lightheadedness and since reports while he is lying in bed, but this occurs when he moves his head in particular positions or directions, and when he was standing for his orthostatic vitals. He reports a family history of cardiac disease in his mother, he is unsure of the details.  Denies illicit drug use.  He is not on any medications except for the occasional ibuprofen.  Drinks alcohol mostly on social occasions, weekends only -1 pint of whiskey.  Quit smoking cigarettes about 2 years ago. He denies prior chest pains, difficulty breathing prior to passing out.  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  Patient also reports diarrhea over the past 2 weeks, at least 3 times daily especially in the mornings.  Stools are nonbloody.  Reports an episode of abdominal pain about 2 weeks ago, that has since resolved, one episode of  vomiting in the ED today, otherwise no prior vomiting.  Reports good p.o. intake.  ED Course: Blood pressure systolic 120s to 517G.  Mild elevation in liver enzymes AST 50, ALT 86.  ,  Otherwise unremarkable CBC, BMP. Hs troponin 11, 11.  Head CT, CT angio head and neck, which showed acute subarachnoid hemorrhage in the right temporal and temporoparietal region, no associated mass-effect or midline shift.  Also nondisplaced fracture left temporal bone. Imaging included left shoulder and chest x-ray which were negative for acute abnormality.  CT abdomen and chest with contrast showed soft tissue hematoma overlying the left hip/gluteal lesion measuring 6.5 cm. EDP talked to neurosurgeon on-call, patient does not need surgery, recommended admission overnight and repeat scan and observation.  Review of Systems: As per HPI all other systems reviewed and negative.  Past Medical History:  Diagnosis Date  . Anxiety   . Asthma   . Depression   . Nerve damage     Past Surgical History:  Procedure Laterality Date  . APPENDECTOMY       reports that he quit smoking about 2 years ago. His smoking use included cigarettes. He smoked 0.50 packs per day. He has never used smokeless tobacco. He reports current alcohol use. He reports that he does not use drugs.  No Known Allergies  Family hx of cardiac disease- mother, father cancer.  Prior to Admission medications   Medication Sig Start Date End  Date Taking? Authorizing Provider  dicyclomine (BENTYL) 20 MG tablet Take 1 tablet (20 mg total) by mouth 2 (two) times daily. Patient not taking: Reported on 10/08/2019 08/25/19   Anselm Pancoast, PA-C  ibuprofen (MOTRIN IB) 200 MG tablet Take 200 mg by mouth every 6 (six) hours as needed for fever or moderate pain.    [provider]  ondansetron (ZOFRAN ODT) 4 MG disintegrating tablet Take 1 tablet (4 mg total) by mouth every 8 (eight) hours as needed for nausea or vomiting. Patient not taking: Reported  on 10/08/2019 08/25/19   Joy, Ines Bloomer C, PA-C  Phenyleph-Doxylamine-DM-APAP (NYQUIL SEVERE COLD/FLU) 5-6.25-10-325 MG/15ML LIQD Take 15 mLs by mouth daily as needed (FOR FLU LIKE SYMPTOMS).    [provider]  promethazine (PHENERGAN) 25 MG tablet Take 1 tablet (25 mg total) by mouth every 6 (six) hours as needed for nausea or vomiting. Patient not taking: Reported on 10/08/2019 11/06/18   Burgess Amor, PA-C    Physical Exam: Vitals:   10/08/19 1345 10/08/19 1422 10/08/19 1430 10/08/19 1500  BP:  (!) 137/106 (!) 138/102 (!) 141/112  Pulse: 85 88 84 77  Resp: 17 18 13 11   Temp:      TempSrc:      SpO2: 95% 98% 95% 98%  Weight:      Height:        Constitutional: NAD, calm, comfortable Vitals:   10/08/19 1345 10/08/19 1422 10/08/19 1430 10/08/19 1500  BP:  (!) 137/106 (!) 138/102 (!) 141/112  Pulse: 85 88 84 77  Resp: 17 18 13 11   Temp:      TempSrc:      SpO2: 95% 98% 95% 98%  Weight:      Height:       Eyes: PERRL, lids and conjunctivae normal ENMT: Mucous membranes are moist. Neck: normal, supple, no masses, no thyromegaly Respiratory:  Normal respiratory effort. No accessory muscle use.  Cardiovascular: Regular rate and rhythm, No extremity edema. 2+ pedal pulses.   Abdomen: no tenderness, no masses palpated. No hepatosplenomegaly.  Musculoskeletal: no clubbing / cyanosis. No joint deformity upper and lower extremities. Good ROM, no contractures. Normal muscle tone.  Skin: no rashes, lesions, ulcers. No induration Neurologic: CN 2-12 grossly intact. Strength 5/5 in all 4.  Psychiatric: Normal judgment and insight. Alert and oriented x 3. Normal mood.   Labs on Admission: I have personally reviewed following labs and imaging studies  CBC: Recent Labs  Lab 10/08/19 0908  WBC 7.6  NEUTROABS 5.4  HGB 14.0  HCT 42.3  MCV 96.4  PLT 332   Basic Metabolic Panel: Recent Labs  Lab 10/08/19 0908  NA 141  K 4.1  CL 108  CO2 23  GLUCOSE 102*  BUN 18  CREATININE  0.94  CALCIUM 8.8*   Liver Function Tests: Recent Labs  Lab 10/08/19 0908  AST 50*  ALT 86*  ALKPHOS 51  BILITOT 0.6  PROT 7.5  ALBUMIN 4.1   Urine analysis:    Component Value Date/Time   COLORURINE YELLOW 08/25/2019 1421   APPEARANCEUR CLEAR 08/25/2019 1421   LABSPEC 1.023 08/25/2019 1421   PHURINE 5.0 08/25/2019 1421   GLUCOSEU NEGATIVE 08/25/2019 1421   HGBUR NEGATIVE 08/25/2019 1421   BILIRUBINUR NEGATIVE 08/25/2019 1421   KETONESUR NEGATIVE 08/25/2019 1421   PROTEINUR 30 (A) 08/25/2019 1421   NITRITE NEGATIVE 08/25/2019 1421   LEUKOCYTESUR NEGATIVE 08/25/2019 1421    Radiological Exams on Admission: CT ANGIO HEAD W OR WO CONTRAST  Result Date: 10/08/2019 CLINICAL DATA:  Larey Seat down steps.  Subarachnoid hemorrhage. EXAM: CT ANGIOGRAPHY HEAD AND NECK TECHNIQUE: Multidetector CT imaging of the head and neck was performed using the standard protocol during bolus administration of intravenous contrast. Multiplanar CT image reconstructions and MIPs were obtained to evaluate the vascular anatomy. Carotid stenosis measurements (when applicable) are obtained utilizing NASCET criteria, using the distal internal carotid diameter as the denominator. CONTRAST:  75mL OMNIPAQUE IOHEXOL 350 MG/ML SOLN COMPARISON:  CT head earlier today FINDINGS: CT HEAD FINDINGS Brain: Small volume subarachnoid hemorrhage on the right is stable. No subdural hemorrhage. No midline shift. Basilar cisterns and suprasellar cistern negative for hemorrhage. Ventricle size normal. Negative for acute infarct or mass. Vascular: Negative for hyperdense vessel Skull: Nondepressed fracture left temporal bone squamosal portion, unchanged from the prior study. Overlying soft tissue swelling in the left parietal scalp. Sinuses: Extensive mucosal edema throughout the paranasal sinuses unchanged. Negative orbit. Orbits: Negative orbit Review of the MIP images confirms the above findings NECK FINDINGS Aortic arch: Normal aortic  arch. Bovine branching pattern. Proximal great vessels normal. Right carotid system: Normal right carotid. Negative for atherosclerotic disease or dissection. Left carotid system: Normal left carotid. Negative for atherosclerotic disease or dissection Vertebral arteries: Both vertebral arteries are normal and widely patent to the basilar Skeleton: Negative for cervical spine fracture. Poor dentition with multiple caries Other neck: Negative for mass or soft tissue swelling in the neck. Upper chest: Lung apices clear bilaterally. Review of the MIP images confirms the above findings CTA HEAD FINDINGS Anterior circulation: Cavernous carotid widely patent and normal bilaterally. No stenosis or atherosclerotic disease. Negative for aneurysm. Anterior and middle cerebral arteries normal bilaterally without stenosis or aneurysm. Posterior circulation: Both vertebral arteries patent to the basilar. PICA patent bilaterally. Basilar widely patent. Superior cerebellar and posterior cerebral arteries widely patent bilaterally. Negative for aneurysm. Venous sinuses: Normal venous enhancement. Anatomic variants: None Review of the MIP images confirms the above findings IMPRESSION: 1. Small volume subarachnoid hemorrhage on the right is stable. No new hemorrhage identified. 2. Nondisplaced fracture left temporal bone with overlying soft tissue swelling in the scalp. Findings suggest significant head trauma with contrecoup subarachnoid hemorrhage on the right. 3. Negative for cerebral aneurysm 4. No significant intracranial or extracranial arterial stenosis. 5. These results were called by telephone at the time of interpretation on 10/08/2019 at 1:40 pm to provider JOSEPH ZAMMIT , who verbally acknowledged these results. Electronically Signed   By: Marlan Palau M.D.   On: 10/08/2019 13:42   DG Chest 1 View  Result Date: 10/08/2019 CLINICAL DATA:  Fall EXAM: CHEST  1 VIEW COMPARISON:  03/27/2018 FINDINGS: The heart size and  mediastinal contours are within normal limits. Both lungs are clear. The visualized skeletal structures are unremarkable. IMPRESSION: No acute abnormality of the lungs in AP portable projection. Electronically Signed   By: Lauralyn Primes M.D.   On: 10/08/2019 10:13   CT Head Wo Contrast  Result Date: 10/08/2019 CLINICAL DATA:  [place]arey Seat down steps. Loss of consciousness. EXAM: CT HEAD WITHOUT CONTRAST CT CERVICAL SPINE WITHOUT CONTRAST TECHNIQUE: Multidetector CT imaging of the head and cervical spine was performed following the standard protocol without intravenous contrast. Multiplanar CT image reconstructions of the cervical spine were also generated. COMPARISON:  None. FINDINGS: CT HEAD FINDINGS Brain: Acute subarachnoid hemorrhage is seen in the right temporal lobe and temporoparietal region, well demonstrated on axial images 13, 14 of series 2 and coronal images 36 through 39 of series 4.  No midline shift. No hydrocephalus. No mass lesion evident. Vascular: No hyperdense vessel or unexpected calcification. Skull: No evidence for fracture. No worrisome lytic or sclerotic lesion. Chronic deformity noted in the mandibular condyles bilaterally with bilateral TMJ subluxation. Sinuses/Orbits: Near complete opacification noted in the bilateral frontal sinuses, bilateral ethmoid air cells, and right maxillary sinus. Extensive chronic mucosal disease noted in the left maxillary sinus. Mastoid air cells are clear bilaterally. Other: None. CT CERVICAL SPINE FINDINGS Alignment: Normal. Skull base and vertebrae: No acute fracture. No primary bone lesion or focal pathologic process. Soft tissues and spinal canal: No prevertebral fluid or swelling. No visible canal hematoma. Disc levels:  Preserved throughout. Upper chest: Unremarkable. Other: None. IMPRESSION: 1. Acute subarachnoid hemorrhage identified in the right temporal and temporoparietal region. No associated mass effect or midline shift. 2. No cervical spine fracture.  3. Chronic paranasal sinusitis. 4. Chronic deformity of the mandibular condyles bilaterally, potentially posttraumatic or degenerative with subluxation of the bilateral temporomandibular joints. Critical Value/emergent results were called by telephone at the time of interpretation on 10/08/2019 at 10:52 am to provider [facility] ZAMMIT , who verbally acknowledged these results. Electronically Signed   By: Kennith Center M.D.   On: 10/08/2019 10:53   CT Angio Neck W and/or Wo Contrast  Result Date: 10/08/2019 CLINICAL DATA:  [place]arey Seat down steps.  Subarachnoid hemorrhage. EXAM: CT ANGIOGRAPHY HEAD AND NECK TECHNIQUE: Multidetector CT imaging of the head and neck was performed using the standard protocol during bolus administration of intravenous contrast. Multiplanar CT image reconstructions and MIPs were obtained to evaluate the vascular anatomy. Carotid stenosis measurements (when applicable) are obtained utilizing NASCET criteria, using the distal internal carotid diameter as the denominator. CONTRAST:  28mL OMNIPAQUE IOHEXOL 350 MG/ML SOLN COMPARISON:  CT head earlier today FINDINGS: CT HEAD FINDINGS Brain: Small volume subarachnoid hemorrhage on the right is stable. No subdural hemorrhage. No midline shift. Basilar cisterns and suprasellar cistern negative for hemorrhage. Ventricle size normal. Negative for acute infarct or mass. Vascular: Negative for hyperdense vessel Skull: Nondepressed fracture left temporal bone squamosal portion, unchanged from the prior study. Overlying soft tissue swelling in the left parietal scalp. Sinuses: Extensive mucosal edema throughout the paranasal sinuses unchanged. Negative orbit. Orbits: Negative orbit Review of the MIP images confirms the above findings NECK FINDINGS Aortic arch: Normal aortic arch. Bovine branching pattern. Proximal great vessels normal. Right carotid system: Normal right carotid. Negative for atherosclerotic disease or dissection. Left carotid system: Normal left  carotid. Negative for atherosclerotic disease or dissection Vertebral arteries: Both vertebral arteries are normal and widely patent to the basilar Skeleton: Negative for cervical spine fracture. Poor dentition with multiple caries Other neck: Negative for mass or soft tissue swelling in the neck. Upper chest: Lung apices clear bilaterally. Review of the MIP images confirms the above findings CTA HEAD FINDINGS Anterior circulation: Cavernous carotid widely patent and normal bilaterally. No stenosis or atherosclerotic disease. Negative for aneurysm. Anterior and middle cerebral arteries normal bilaterally without stenosis or aneurysm. Posterior circulation: Both vertebral arteries patent to the basilar. PICA patent bilaterally. Basilar widely patent. Superior cerebellar and posterior cerebral arteries widely patent bilaterally. Negative for aneurysm. Venous sinuses: Normal venous enhancement. Anatomic variants: None Review of the MIP images confirms the above findings IMPRESSION: 1. Small volume subarachnoid hemorrhage on the right is stable. No new hemorrhage identified. 2. Nondisplaced fracture left temporal bone with overlying soft tissue swelling in the scalp. Findings suggest significant head trauma with contrecoup subarachnoid hemorrhage on the right. 3. Negative  for cerebral aneurysm 4. No significant intracranial or extracranial arterial stenosis. 5. These results were called by telephone at the time of interpretation on 10/08/2019 at 1:40 pm to provider JOSEPH ZAMMIT , who verbally acknowledged these results. Electronically Signed   By: Franchot Gallo M.D.   On: 10/08/2019 13:42   CT Chest W Contrast  Result Date: 10/08/2019 CLINICAL DATA:  Fall, left-sided rib pain. History of appendectomy EXAM: CT CHEST, ABDOMEN, AND PELVIS WITH CONTRAST TECHNIQUE: Multidetector CT imaging of the chest, abdomen and pelvis was performed following the standard protocol during bolus administration of intravenous contrast.  CONTRAST:  172mL OMNIPAQUE IOHEXOL 300 MG/ML  SOLN COMPARISON:  CT chest 09/21/2017, CT abdomen-pelvis 08/25/2019 FINDINGS: CT CHEST FINDINGS Cardiovascular: Normal heart size. No pericardial effusion. Thoracic aorta is normal in course and caliber. Pulmonary trunk is nondilated. No significant vascular findings identified. Mediastinum/Nodes: No enlarged mediastinal, hilar, or axillary lymph nodes. Thyroid gland, trachea, and esophagus demonstrate no significant findings. Lungs/Pleura: Lungs are clear. No pleural effusion or pneumothorax. Musculoskeletal: Healed fractures of the posterior right eighth, ninth, and tenth ribs. Normal spinal alignment. Vertebral body heights are maintained. No acute osseous findings. No chest wall hematoma or other abnormality. CT ABDOMEN PELVIS FINDINGS Hepatobiliary: Diffusely decreased attenuation of the hepatic parenchyma compatible with hepatic steatosis. No focal hepatic injury or pericapsular collection. Gallbladder unremarkable. No gallstone or biliary dilatation. Pancreas: Unremarkable. No pancreatic ductal dilatation or surrounding inflammatory changes. Spleen: No splenic injury or perisplenic hematoma. Adrenals/Urinary Tract: No adrenal hemorrhage or renal injury identified. Bladder is unremarkable. Stomach/Bowel: Stomach is within normal limits. Appendix surgically absent. Scattered colonic diverticulosis. No evidence of bowel wall thickening, distention, or inflammatory changes. Vascular/Lymphatic: No significant vascular findings are present. No enlarged abdominal or pelvic lymph nodes. Reproductive: Prostate is unremarkable. Other: Small fat containing umbilical hernia. No abdominopelvic ascites. Musculoskeletal: Induration within the subcutaneous tissues overlying the left hip and gluteal region with focal hyperdense fluid collection measuring 5.6 x 1.7 x 6.5 cm (volume = 32 cm^3) suggestive of a hematoma (series 2, image 117). Pelvic bony ring is intact without  fracture. Bilateral hip joints intact without fracture or dislocation. Lumbar vertebral body heights are maintained without fracture or static listhesis. Intervertebral disc height loss of L5-S1. IMPRESSION: 1. Soft tissue hematoma overlying the left hip/gluteal region measuring up to 6.5 cm. 2. No acute osseous abnormality. 3. No acute findings within the chest. No solid organ or visceral injury within the abdomen or pelvis. 4. Hepatic steatosis. Electronically Signed   By: Davina Poke D.O.   On: 10/08/2019 10:59   CT Cervical Spine Wo Contrast  Result Date: 10/08/2019 CLINICAL DATA:  [place] down steps. Loss of consciousness. EXAM: CT HEAD WITHOUT CONTRAST CT CERVICAL SPINE WITHOUT CONTRAST TECHNIQUE: Multidetector CT imaging of the head and cervical spine was performed following the standard protocol without intravenous contrast. Multiplanar CT image reconstructions of the cervical spine were also generated. COMPARISON:  None. FINDINGS: CT HEAD FINDINGS Brain: Acute subarachnoid hemorrhage is seen in the right temporal lobe and temporoparietal region, well demonstrated on axial images 13, 14 of series 2 and coronal images 36 through 39 of series 4. No midline shift. No hydrocephalus. No mass lesion evident. Vascular: No hyperdense vessel or unexpected calcification. Skull: No evidence for fracture. No worrisome lytic or sclerotic lesion. Chronic deformity noted in the mandibular condyles bilaterally with bilateral TMJ subluxation. Sinuses/Orbits: Near complete opacification noted in the bilateral frontal sinuses, bilateral ethmoid air cells, and right maxillary sinus. Extensive chronic mucosal disease  noted in the left maxillary sinus. Mastoid air cells are clear bilaterally. Other: None. CT CERVICAL SPINE FINDINGS Alignment: Normal. Skull base and vertebrae: No acute fracture. No primary bone lesion or focal pathologic process. Soft tissues and spinal canal: No prevertebral fluid or swelling. No visible  canal hematoma. Disc levels:  Preserved throughout. Upper chest: Unremarkable. Other: None. IMPRESSION: 1. Acute subarachnoid hemorrhage identified in the right temporal and temporoparietal region. No associated mass effect or midline shift. 2. No cervical spine fracture. 3. Chronic paranasal sinusitis. 4. Chronic deformity of the mandibular condyles bilaterally, potentially posttraumatic or degenerative with subluxation of the bilateral temporomandibular joints. Critical Value/emergent results were called by telephone at the time of interpretation on 10/08/2019 at 10:52 am to provider [facility] ZAMMIT , who verbally acknowledged these results. Electronically Signed   By: Kennith Center M.D.   On: 10/08/2019 10:53   CT ABDOMEN PELVIS W CONTRAST  Result Date: 10/08/2019 CLINICAL DATA:  Fall, left-sided rib pain. History of appendectomy EXAM: CT CHEST, ABDOMEN, AND PELVIS WITH CONTRAST TECHNIQUE: Multidetector CT imaging of the chest, abdomen and pelvis was performed following the standard protocol during bolus administration of intravenous contrast. CONTRAST:  OMNIPAQUE IOHEXOL 300 MG/ML  SOLN COMPARISON:  CT chest 09/21/2017, CT abdomen-pelvis 08/25/2019 FINDINGS: CT CHEST FINDINGS Cardiovascular: Normal heart size. No pericardial effusion. Thoracic aorta is normal in course and caliber. Pulmonary trunk is nondilated. No significant vascular findings identified. Mediastinum/Nodes: No enlarged mediastinal, hilar, or axillary lymph nodes. Thyroid gland, trachea, and esophagus demonstrate no significant findings. Lungs/Pleura: Lungs are clear. No pleural effusion or pneumothorax. Musculoskeletal: Healed fractures of the posterior right eighth, ninth, and tenth ribs. Normal spinal alignment. Vertebral body heights are maintained. No acute osseous findings. No chest wall hematoma or other abnormality. CT ABDOMEN PELVIS FINDINGS Hepatobiliary: Diffusely decreased attenuation of the hepatic parenchyma compatible with  hepatic steatosis. No focal hepatic injury or pericapsular collection. Gallbladder unremarkable. No gallstone or biliary dilatation. Pancreas: Unremarkable. No pancreatic ductal dilatation or surrounding inflammatory changes. Spleen: No splenic injury or perisplenic hematoma. Adrenals/Urinary Tract: No adrenal hemorrhage or renal injury identified. Bladder is unremarkable. Stomach/Bowel: Stomach is within normal limits. Appendix surgically absent. Scattered colonic diverticulosis. No evidence of bowel wall thickening, distention, or inflammatory changes. Vascular/Lymphatic: No significant vascular findings are present. No enlarged abdominal or pelvic lymph nodes. Reproductive: Prostate is unremarkable. Other: Small fat containing umbilical hernia. No abdominopelvic ascites. Musculoskeletal: Induration within the subcutaneous tissues overlying the left hip and gluteal region with focal hyperdense fluid collection measuring 5.6 x 1.7 x 6.5 cm (volume = 32 cm^3) suggestive of a hematoma (series 2, image 117). Pelvic bony ring is intact without fracture. Bilateral hip joints intact without fracture or dislocation. Lumbar vertebral body heights are maintained without fracture or static listhesis. Intervertebral disc height loss of L5-S1. IMPRESSION: 1. Soft tissue hematoma overlying the left hip/gluteal region measuring up to 6.5 cm. 2. No acute osseous abnormality. 3. No acute findings within the chest. No solid organ or visceral injury within the abdomen or pelvis. 4. Hepatic steatosis. Electronically Signed   By: Duanne Guess D.O.   On: 10/08/2019 10:59   DG Shoulder Left  Result Date: 10/08/2019 CLINICAL DATA:  Fall, pain EXAM: LEFT SHOULDER - 2+ VIEW COMPARISON:  None. FINDINGS: There is no evidence of fracture or dislocation. There is no evidence of arthropathy or other focal bone abnormality. Soft tissues are unremarkable. IMPRESSION: No fracture or dislocation of the left shoulder. Joint spaces are  preserved. Electronically Signed  By: Lauralyn Primes M.D.   On: 10/08/2019 10:11    EKG: Independently reviewed. Sinus rhythm, t wave changes to V5 and V6, T wave abnormalities 2 3 aVF.  QTc 465.   Assessment/Plan Active Problems:   Subarachnoid hemorrhage (HCC)   Syncope and fall- positional and present on standing, prodrome present prior to syncopal episode. But his vitals are not exactly orthostatic.  Denies cardiorespiratory symptoms.  Differentials include positional vertigo. His delayed return of consciousness likely 2/2 head trauma and subsequent subarachnoid hemorrhage.  EKG showing some T wave abnormalities in lateral leads in the setting of intracranial event. Hs troponin 11 x 2. -Obtain echocardiogram - Carotid dopplers -UDS- positive for cannabinoids -Check blood alcohol level < 10. -Repeat EKG in the morning -IV morphine 2 mg q4h PRN - TSH, Magnesium - PT eval  Subarachnoid hemorrhage- 2/2 syncopal episode.  Head imaging includes CTA head neck and head CT-small-volume subarachnoid hemorrhage right temporal and temporoparietal area,no midline shift or mass-effect, nondisplaced fracture left temporal bone.   - Not on antiplatelets or anticoagulation. - EDP talked to neurosurgeon on-call, Dr. Venetia Maxon, no need for surgical intervention, recommended observation, repeat head CT in the morning.  Left hip/gluteal region hematoma-measuring 6.5 cm seen on abdominal/pelvic CT, likely related to syncope and fall. - CBC a.m  Mild transaminitis- AST 50, ALT 86.  Reports alcohol use on social occasions and weekends. -Acute hepatitis panel  Diarrhea-  -Stool C. difficile   DVT prophylaxis: SCDs Code Status: Full code Family Communication: None at bedside Disposition Plan: ~ 1- 2 days Consults called: None Admission status: Inpt, Step down. I certify that at the point of admission it is my clinical judgment that the patient will require inpatient hospital care spanning beyond 2  midnights from the point of admission due to high intensity of service, high risk for further deterioration and high frequency of surveillance required. The following factors support the patient status of inpatient: Subdural hematoma requiring stepdown level of care.   Onnie Boer MD [facility]  10/08/2019, 4:58 PM

## 2019-10-08 NOTE — ED Notes (Addendum)
Notified EDP of current Bp with no new orders.

## 2019-10-08 NOTE — ED Triage Notes (Signed)
Pt was getting ready for work and open the door to the basement steps and when he woke up he was at the bottom of the steps. The last thing he remembers is being at the top of the steps.

## 2019-10-09 ENCOUNTER — Inpatient Hospital Stay (HOSPITAL_COMMUNITY): Payer: Medicaid Other

## 2019-10-09 ENCOUNTER — Inpatient Hospital Stay (HOSPITAL_COMMUNITY): Payer: Self-pay

## 2019-10-09 ENCOUNTER — Encounter (HOSPITAL_COMMUNITY): Payer: Self-pay | Admitting: Internal Medicine

## 2019-10-09 DIAGNOSIS — R55 Syncope and collapse: Secondary | ICD-10-CM

## 2019-10-09 DIAGNOSIS — I5021 Acute systolic (congestive) heart failure: Secondary | ICD-10-CM

## 2019-10-09 DIAGNOSIS — N179 Acute kidney failure, unspecified: Secondary | ICD-10-CM

## 2019-10-09 LAB — ECHOCARDIOGRAM COMPLETE
Height: 72 in
Weight: 3280 oz

## 2019-10-09 LAB — CBC
HCT: 41 % (ref 39.0–52.0)
Hemoglobin: 13.6 g/dL (ref 13.0–17.0)
MCH: 32.1 pg (ref 26.0–34.0)
MCHC: 33.2 g/dL (ref 30.0–36.0)
MCV: 96.7 fL (ref 80.0–100.0)
Platelets: 329 10*3/uL (ref 150–400)
RBC: 4.24 MIL/uL (ref 4.22–5.81)
RDW: 12.3 % (ref 11.5–15.5)
WBC: 8.6 10*3/uL (ref 4.0–10.5)
nRBC: 0 % (ref 0.0–0.2)

## 2019-10-09 LAB — COMPREHENSIVE METABOLIC PANEL
ALT: 65 U/L — ABNORMAL HIGH (ref 0–44)
AST: 29 U/L (ref 15–41)
Albumin: 3.5 g/dL (ref 3.5–5.0)
Alkaline Phosphatase: 52 U/L (ref 38–126)
Anion gap: 10 (ref 5–15)
BUN: 18 mg/dL (ref 6–20)
CO2: 27 mmol/L (ref 22–32)
Calcium: 8.9 mg/dL (ref 8.9–10.3)
Chloride: 101 mmol/L (ref 98–111)
Creatinine, Ser: 1.46 mg/dL — ABNORMAL HIGH (ref 0.61–1.24)
GFR calc Af Amer: >60 mL/min (ref >60)
GFR calc non Af Amer: 58 mL/min — ABNORMAL LOW (ref >60)
Glucose, Bld: 97 mg/dL (ref 70–99)
Potassium: 4 mmol/L (ref 3.5–5.1)
Sodium: 138 mmol/L (ref 135–145)
Total Bilirubin: 0.8 mg/dL (ref 0.3–1.2)
Total Protein: 6.5 g/dL (ref 6.5–8.1)

## 2019-10-09 LAB — HEPATITIS PANEL, ACUTE
HCV Ab: NONREACTIVE
Hep A IgM: NONREACTIVE
Hep B C IgM: NONREACTIVE
Hepatitis B Surface Ag: NONREACTIVE

## 2019-10-09 MED ORDER — DIGOXIN 125 MCG PO TABS
0.1250 mg | ORAL_TABLET | Freq: Every day | ORAL | Status: DC
  Administered 2019-10-09 – 2019-10-14 (×6): 0.125 mg via ORAL
  Filled 2019-10-09 (×6): qty 1

## 2019-10-09 MED ORDER — SODIUM CHLORIDE 0.9% FLUSH
3.0000 mL | Freq: Two times a day (BID) | INTRAVENOUS | Status: DC
  Administered 2019-10-09 – 2019-10-14 (×9): 3 mL via INTRAVENOUS

## 2019-10-09 MED ORDER — SODIUM CHLORIDE 0.9 % IV SOLN: INTRAVENOUS | Status: DC

## 2019-10-09 MED ORDER — SODIUM CHLORIDE 0.9% FLUSH: 3.0000 mL | INTRAVENOUS | Status: DC | PRN

## 2019-10-09 MED ORDER — ASPIRIN 81 MG PO CHEW: 81.0000 mg | CHEWABLE_TABLET | ORAL | Status: DC

## 2019-10-09 MED ORDER — PERFLUTREN LIPID MICROSPHERE
1.0000 mL | INTRAVENOUS | Status: AC | PRN
  Administered 2019-10-09: 2 mL via INTRAVENOUS
  Filled 2019-10-09: qty 10

## 2019-10-09 MED ORDER — SODIUM CHLORIDE 0.9 % IV SOLN: 250.0000 mL | INTRAVENOUS | Status: DC | PRN

## 2019-10-09 NOTE — TOC Initial Note (Signed)
Transition of Care [facility]) - Initial/Assessment Note    Patient Details  Name: Andrew Hinton MRN: [id number] Date of Birth: [birth year]  [facility] CM/SW Contact:    Pollie Friar, RN Phone Number: 10/09/2019, 4:06 PM  Clinical Narrative:                 CM met with the patient about PCP. Pt without PCP but agreeable to one of the clinics in [facility]. CM left information on 2 clinics in [place] on his AVS. Pt will have to f/u with the clinics to get an appointment. Pt denies issues with transportation, no DME at the home not on any meds prior to admission.  May need MATCH at d/c depending on medication cost.  TOC following.   Expected Discharge Plan: Home/Self Care Barriers to Discharge: Continued Medical Work up   Patient Goals and CMS Choice        Expected Discharge Plan and Services Expected Discharge Plan: Home/Self Care   Discharge Planning Services: CM Consult   Living arrangements for the past 2 months: Single Family Home                                      Prior Living Arrangements/Services Living arrangements for the past 2 months: Single Family Home Lives with:: Parents, Minor Children Patient language and need for interpreter reviewed:: Yes Do you feel safe going back to the place where you live?: Yes            Criminal Activity/Legal Involvement Pertinent to Current Situation/Hospitalization: No - Comment as needed  Activities of Daily Living Home Assistive Devices/Equipment: None ADL Screening (condition at time of admission) Patient's cognitive ability adequate to safely complete daily activities?: Yes Is the patient deaf or have difficulty hearing?: No Does the patient have difficulty seeing, even when wearing glasses/contacts?: No Does the patient have difficulty concentrating, remembering, or making decisions?: No Patient able to express need for assistance with ADLs?: Yes Does the patient have difficulty  dressing or bathing?: No Independently performs ADLs?: Yes (appropriate for developmental age) Does the patient have difficulty walking or climbing stairs?: No Weakness of Legs: None Weakness of Arms/Hands: None  Permission Sought/Granted                  Emotional Assessment Appearance:: Appears stated age Attitude/Demeanor/Rapport: Engaged Affect (typically observed): Accepting Orientation: : Oriented to Self, Oriented to Place, Oriented to  Time, Oriented to Situation   Psych Involvement: No (comment)  Admission diagnosis:  Subarachnoid hemorrhage (HCC) [I60.9] Iron deficiency anemia due to chronic blood loss [D50.0] Fall [W19.XXXA] Patient Active Problem List   Diagnosis Date Noted  . Syncope 10/09/2019  . AKI (acute kidney injury) ([place]) 10/09/2019  . Acute systolic CHF (congestive heart failure) ([place]) 10/09/2019  . Subarachnoid hemorrhage ([place]) 10/08/2019   PCP:  Patient, No Pcp Per Pharmacy:   [place], [place] - [address] [place] [address] [place] [address] Phone: [phone] Fax: [place], [address] [place] [address] Phone: [phone] Fax: [phone]     Social Determinants of Health (SDOH) Interventions    Readmission Risk Interventions No flowsheet data found.

## 2019-10-09 NOTE — Progress Notes (Signed)
PROGRESS NOTE    Andrew Hinton  GEX:[id number] DOB: 02-Dec-1975 DOA: 10/08/2019 PCP: Patient, No Pcp Per     Brief Narrative:  Andrew Hinton is a 44 y.o. male with medical history significant for anxiety asthma depression. Patient was getting ready for work when he opened his basement steps, he was standing at the top of the basement and the next thing he knew, he was waking up at the bottom of the steps.  The last thing he remembers is being at the top of the stairs.  He woke up to his 60 year old son shouting and trying to shake him awake.  He thinks he might have been out for about 10 minutes. He reports he woke up this morning feeling a bit different.  When he was standing at the top of the stairs, he reports a lightheaded feeling, seeing spots like he was going to pass out. He also reports dizziness over the past several months, which worsens with positional change, moving his head. Head CT revealed acute subarachnoid hemorrhage in the right temporal and temporoparietal region, no associated mass-effect or midline shift.  Also nondisplaced fracture left temporal bone. Imaging included left shoulder and chest x-ray which were negative for acute abnormality.  CT abdomen and chest with contrast showed soft tissue hematoma overlying the left hip/gluteal lesion measuring 6.5 cm. Case discussed with neurosurgeon on-call; recommended admission overnight and repeat CT head and observation.   New events last 24 hours / Subjective: Complains of headache this morning. Has not had much to eat or drink for several days.   Assessment & Plan:   Principal Problem:   Syncope Active Problems:   Subarachnoid hemorrhage (HCC)   AKI (acute kidney injury) (HCC)   Acute systolic CHF (congestive heart failure) (HCC)   Syncope -Echocardiogram as below  -Carotid Doppler pending   Subarachnoid hemorrhage -EDP discussed with neurosurgery on-call, Dr. Venetia Maxon, no recommendations for surgical intervention  at this time.  Recommended observation and repeat head CT -CT head 2/25 with acute subarachnoid hemorrhage in the right temporal and temporoparietal region without mass-effect or midline shift.  No cervical spine fracture. -Repeat head CT 2/26 with no significant change in small volume subarachnoid hemorrhage in the right temporal lobe.  No midline shift or mass-effect.  No increase in hemorrhage compared to exam  New onset systolic heart failure -Echocardiogram revealed EF <20% with global hypokinesis  -Cardiology consult  AKI -Baseline creatinine ~1 -IV fluid - stopped after receiving echo result   Left temporal bone fracture, nondisplaced Left hip/gluteal hematoma -Left shoulder x-ray without fracture or dislocation -Chest x-ray without acute abnormality, CT chest without acute findings in the chest.  No acute osseous abnormality -CTA head and neck with nondisplaced fracture left temporal bone with overlying soft tissue swelling in the scalp -Secondary to fall.  Supportive care  Diarrhea -Now resolved.  Discontinued C. difficile test     DVT prophylaxis: SCD Code Status: Full code Family Communication: None at bedside Disposition Plan:  . Patient is from home prior to admission. . Currently in-hospital treatment needed due to further work-up for new onset systolic heart failure.  Cardiology consulted today. . Suspect patient will discharge home in 1 to 2 days.   Consultants:   Neurosurgery over the phone  Cardiology  Procedures:   None  Antimicrobials:  Anti-infectives (From admission, onward)   None       Objective: Vitals:   10/09/19 0003 10/09/19 0425 10/09/19 1002 10/09/19 1202  BP: 101/73 119/85 118/81 105/74  Pulse: 84 76 71 72  Resp: 14 11 15 19   Temp: 98 F (36.7 C) (!) 97.5 F (36.4 C) 98.4 F (36.9 C) 97.6 F (36.4 C)  TempSrc: Oral Axillary Oral Oral  SpO2:  96% 94% 98%  Weight:      Height:        Intake/Output Summary (Last 24 hours)  at 10/09/2019 1323 Last data filed at 10/09/2019 1200 Gross per 24 hour  Intake 1008.6 ml  Output --  Net 1008.6 ml   Filed Weights   10/08/19 0811  Weight: 93 kg    Examination:  General exam: Appears calm and comfortable  Respiratory system: Clear to auscultation. Respiratory effort normal. No respiratory distress. No conversational dyspnea.  Cardiovascular system: S1 & S2 heard, RRR. No pedal edema. Gastrointestinal system: Abdomen is nondistended, soft and nontender. Normal bowel sounds heard. Central nervous system: Alert and oriented. No focal neurological deficits. Speech clear.  Extremities: Symmetric in appearance  Skin: No rashes, lesions or ulcers on exposed skin. Scalp laceration over left forehead  Psychiatry: Judgement and insight appear normal. Mood & affect appropriate.   Data Reviewed: I have personally reviewed following labs and imaging studies  CBC: Recent Labs  Lab 10/08/19 0908 10/09/19 0311  WBC 7.6 8.6  NEUTROABS 5.4  --   HGB 14.0 13.6  HCT 42.3 41.0  MCV 96.4 96.7  PLT 332 329   Basic Metabolic Panel: Recent Labs  Lab 10/08/19 0908 10/08/19 1537 10/09/19 0311  NA 141  --  138  K 4.1  --  4.0  CL 108  --  101  CO2 23  --  27  GLUCOSE 102*  --  97  BUN 18  --  18  CREATININE 0.94  --  1.46*  CALCIUM 8.8*  --  8.9  MG  --  2.2  --    GFR: Estimated Creatinine Clearance: 72.3 mL/min (A) (by C-G formula based on SCr of 1.46 mg/dL (H)). Liver Function Tests: Recent Labs  Lab 10/08/19 0908 10/09/19 0311  AST 50* 29  ALT 86* 65*  ALKPHOS 51 52  BILITOT 0.6 0.8  PROT 7.5 6.5  ALBUMIN 4.1 3.5   No results for input(s): LIPASE, AMYLASE in the last 168 hours. No results for input(s): AMMONIA in the last 168 hours. Coagulation Profile: No results for input(s): INR, PROTIME in the last 168 hours. Cardiac Enzymes: No results for input(s): CKTOTAL, CKMB, CKMBINDEX, TROPONINI in the last 168 hours. BNP (last 3 results) No results for  input(s): PROBNP in the last 8760 hours. HbA1C: No results for input(s): HGBA1C in the last 72 hours. CBG: No results for input(s): GLUCAP in the last 168 hours. Lipid Profile: No results for input(s): CHOL, HDL, LDLCALC, TRIG, CHOLHDL, LDLDIRECT in the last 72 hours. Thyroid Function Tests: Recent Labs    10/08/19 1537  TSH 0.778   Anemia Panel: No results for input(s): VITAMINB12, FOLATE, FERRITIN, TIBC, IRON, RETICCTPCT in the last 72 hours. Sepsis Labs: No results for input(s): PROCALCITON, LATICACIDVEN in the last 168 hours.  Recent Results (from the past 240 hour(s))  Respiratory Panel by RT PCR (Flu A&B, Covid) - Nasopharyngeal Swab     Status: None   Collection Time: 10/08/19 12:28 PM   Specimen: Nasopharyngeal Swab  Result Value Ref Range Status   SARS Coronavirus 2 by RT PCR NEGATIVE NEGATIVE Final    Comment: (NOTE) SARS-CoV-2 target nucleic acids are NOT DETECTED. The SARS-CoV-2 RNA is generally detectable in upper respiratoy  specimens during the acute phase of infection. The lowest concentration of SARS-CoV-2 viral copies this assay can detect is 131 copies/mL. A negative result does not preclude SARS-Cov-2 infection and should not be used as the sole basis for treatment or other patient management decisions. A negative result may occur with  improper specimen collection/handling, submission of specimen other than nasopharyngeal swab, presence of viral mutation(s) within the areas targeted by this assay, and inadequate number of viral copies (<131 copies/mL). A negative result must be combined with clinical observations, patient history, and epidemiological information. The expected result is Negative. Fact Sheet for Patients:  https://www.moore.com/ Fact Sheet for Healthcare Providers:  https://www.young.biz/ This test is not yet ap proved or cleared by the [place] FDA and  has been authorized for detection and/or  diagnosis of SARS-CoV-2 by FDA under an Emergency Use Authorization (EUA). This EUA will remain  in effect (meaning this test can be used) for the duration of the COVID-19 declaration under Section 564(b)(1) of the Act, 21 U.S.C. section 360bbb-3(b)(1), unless the authorization is terminated or revoked sooner.    Influenza A by PCR NEGATIVE NEGATIVE Final   Influenza B by PCR NEGATIVE NEGATIVE Final    Comment: (NOTE) The Xpert Xpress SARS-CoV-2/FLU/RSV assay is intended as an aid in  the diagnosis of influenza from Nasopharyngeal swab specimens and  should not be used as a sole basis for treatment. Nasal washings and  aspirates are unacceptable for Xpert Xpress SARS-CoV-2/FLU/RSV  testing. Fact Sheet for Patients: https://www.moore.com/ Fact Sheet for Healthcare Providers: https://www.young.biz/ This test is not yet approved or cleared by the [place] FDA and  has been authorized for detection and/or diagnosis of SARS-CoV-2 by  FDA under an Emergency Use Authorization (EUA). This EUA will remain  in effect (meaning this test can be used) for the duration of the  Covid-19 declaration under Section 564(b)(1) of the Act, 21  U.S.C. section 360bbb-3(b)(1), unless the authorization is  terminated or revoked. Performed at [facility], [address] [place], [place] [address]       Radiology Studies: CT ANGIO HEAD W OR WO CONTRAST  Result Date: 10/08/2019 CLINICAL DATA:  Larey Seat down steps.  Subarachnoid hemorrhage. EXAM: CT ANGIOGRAPHY HEAD AND NECK TECHNIQUE: Multidetector CT imaging of the head and neck was performed using the standard protocol during bolus administration of intravenous contrast. Multiplanar CT image reconstructions and MIPs were obtained to evaluate the vascular anatomy. Carotid stenosis measurements (when applicable) are obtained utilizing NASCET criteria, using the distal internal carotid diameter as the denominator.  CONTRAST:  4mL OMNIPAQUE IOHEXOL 350 MG/ML SOLN COMPARISON:  CT head earlier today FINDINGS: CT HEAD FINDINGS Brain: Small volume subarachnoid hemorrhage on the right is stable. No subdural hemorrhage. No midline shift. Basilar cisterns and suprasellar cistern negative for hemorrhage. Ventricle size normal. Negative for acute infarct or mass. Vascular: Negative for hyperdense vessel Skull: Nondepressed fracture left temporal bone squamosal portion, unchanged from the prior study. Overlying soft tissue swelling in the left parietal scalp. Sinuses: Extensive mucosal edema throughout the paranasal sinuses unchanged. Negative orbit. Orbits: Negative orbit Review of the MIP images confirms the above findings NECK FINDINGS Aortic arch: Normal aortic arch. Bovine branching pattern. Proximal great vessels normal. Right carotid system: Normal right carotid. Negative for atherosclerotic disease or dissection. Left carotid system: Normal left carotid. Negative for atherosclerotic disease or dissection Vertebral arteries: Both vertebral arteries are normal and widely patent to the basilar Skeleton: Negative for cervical spine fracture. Poor dentition with multiple caries  Other neck: Negative for mass or soft tissue swelling in the neck. Upper chest: Lung apices clear bilaterally. Review of the MIP images confirms the above findings CTA HEAD FINDINGS Anterior circulation: Cavernous carotid widely patent and normal bilaterally. No stenosis or atherosclerotic disease. Negative for aneurysm. Anterior and middle cerebral arteries normal bilaterally without stenosis or aneurysm. Posterior circulation: Both vertebral arteries patent to the basilar. PICA patent bilaterally. Basilar widely patent. Superior cerebellar and posterior cerebral arteries widely patent bilaterally. Negative for aneurysm. Venous sinuses: Normal venous enhancement. Anatomic variants: None Review of the MIP images confirms the above findings IMPRESSION: 1. Small  volume subarachnoid hemorrhage on the right is stable. No new hemorrhage identified. 2. Nondisplaced fracture left temporal bone with overlying soft tissue swelling in the scalp. Findings suggest significant head trauma with contrecoup subarachnoid hemorrhage on the right. 3. Negative for cerebral aneurysm 4. No significant intracranial or extracranial arterial stenosis. 5. These results were called by telephone at the time of interpretation on 10/08/2019 at 1:40 pm to provider JOSEPH ZAMMIT , who verbally acknowledged these results. Electronically Signed   By: Marlan Palau M.D.   On: 10/08/2019 13:42   DG Chest 1 View  Result Date: 10/08/2019 CLINICAL DATA:  Fall EXAM: CHEST  1 VIEW COMPARISON:  03/27/2018 FINDINGS: The heart size and mediastinal contours are within normal limits. Both lungs are clear. The visualized skeletal structures are unremarkable. IMPRESSION: No acute abnormality of the lungs in AP portable projection. Electronically Signed   By: Lauralyn Primes M.D.   On: 10/08/2019 10:13   CT HEAD WO CONTRAST  Result Date: 10/09/2019 CLINICAL DATA:  Intracranial hemorrhage. Trauma. EXAM: CT HEAD WITHOUT CONTRAST TECHNIQUE: Contiguous axial images were obtained from the base of the skull through the vertex without intravenous contrast. COMPARISON:  CT 10/08/2019 FINDINGS: Brain: Again demonstrated small volume subarachnoid hemorrhage in the RIGHT temporal lobe. No increased hemorrhage compared to exam 1 day prior. No midline shift or mass effect. No intraventricular hemorrhage. Vascular: No hyperdense vessel or unexpected calcification. Skull: Subtle nondisplaced temporal bone fracture (squamous portion) on the LEFT again noted (image 38/4 and image 22/4). Associated scalp hematoma over the LEFT parietal bone stable thickness at 6 mm. Sinuses/Orbits: Dense opacification of the paranasal sinuses consistent chronic sinusitis. Orbits normal Other: IMPRESSION: 1. No significant change in small volume  subarachnoid hemorrhage in the RIGHT temporal lobe. No midline shift or mass effect. 2. No increased hemorrhage compared to exam 1 day prior. 3. Stable nondisplaced LEFT temporal bone fracture. Electronically Signed   By: Genevive Bi M.D.   On: 10/09/2019 09:13   CT Head Wo Contrast  Result Date: 10/08/2019 CLINICAL DATA:  Larey Seat down steps. Loss of consciousness. EXAM: CT HEAD WITHOUT CONTRAST CT CERVICAL SPINE WITHOUT CONTRAST TECHNIQUE: Multidetector CT imaging of the head and cervical spine was performed following the standard protocol without intravenous contrast. Multiplanar CT image reconstructions of the cervical spine were also generated. COMPARISON:  None. FINDINGS: CT HEAD FINDINGS Brain: Acute subarachnoid hemorrhage is seen in the right temporal lobe and temporoparietal region, well demonstrated on axial images 13, 14 of series 2 and coronal images 36 through 39 of series 4. No midline shift. No hydrocephalus. No mass lesion evident. Vascular: No hyperdense vessel or unexpected calcification. Skull: No evidence for fracture. No worrisome lytic or sclerotic lesion. Chronic deformity noted in the mandibular condyles bilaterally with bilateral TMJ subluxation. Sinuses/Orbits: Near complete opacification noted in the bilateral frontal sinuses, bilateral ethmoid air cells, and right maxillary sinus.  Extensive chronic mucosal disease noted in the left maxillary sinus. Mastoid air cells are clear bilaterally. Other: None. CT CERVICAL SPINE FINDINGS Alignment: Normal. Skull base and vertebrae: No acute fracture. No primary bone lesion or focal pathologic process. Soft tissues and spinal canal: No prevertebral fluid or swelling. No visible canal hematoma. Disc levels:  Preserved throughout. Upper chest: Unremarkable. Other: None. IMPRESSION: 1. Acute subarachnoid hemorrhage identified in the right temporal and temporoparietal region. No associated mass effect or midline shift. 2. No cervical spine  fracture. 3. Chronic paranasal sinusitis. 4. Chronic deformity of the mandibular condyles bilaterally, potentially posttraumatic or degenerative with subluxation of the bilateral temporomandibular joints. Critical Value/emergent results were called by telephone at the time of interpretation on 10/08/2019 at 10:52 am to provider [facility] ZAMMIT , who verbally acknowledged these results. Electronically Signed   By: Kennith Center M.D.   On: 10/08/2019 10:53   CT Angio Neck W and/or Wo Contrast  Result Date: 10/08/2019 CLINICAL DATA:  Larey Seat down steps.  Subarachnoid hemorrhage. EXAM: CT ANGIOGRAPHY HEAD AND NECK TECHNIQUE: Multidetector CT imaging of the head and neck was performed using the standard protocol during bolus administration of intravenous contrast. Multiplanar CT image reconstructions and MIPs were obtained to evaluate the vascular anatomy. Carotid stenosis measurements (when applicable) are obtained utilizing NASCET criteria, using the distal internal carotid diameter as the denominator. CONTRAST:  75mL OMNIPAQUE IOHEXOL 350 MG/ML SOLN COMPARISON:  CT head earlier today FINDINGS: CT HEAD FINDINGS Brain: Small volume subarachnoid hemorrhage on the right is stable. No subdural hemorrhage. No midline shift. Basilar cisterns and suprasellar cistern negative for hemorrhage. Ventricle size normal. Negative for acute infarct or mass. Vascular: Negative for hyperdense vessel Skull: Nondepressed fracture left temporal bone squamosal portion, unchanged from the prior study. Overlying soft tissue swelling in the left parietal scalp. Sinuses: Extensive mucosal edema throughout the paranasal sinuses unchanged. Negative orbit. Orbits: Negative orbit Review of the MIP images confirms the above findings NECK FINDINGS Aortic arch: Normal aortic arch. Bovine branching pattern. Proximal great vessels normal. Right carotid system: Normal right carotid. Negative for atherosclerotic disease or dissection. Left carotid system:  Normal left carotid. Negative for atherosclerotic disease or dissection Vertebral arteries: Both vertebral arteries are normal and widely patent to the basilar Skeleton: Negative for cervical spine fracture. Poor dentition with multiple caries Other neck: Negative for mass or soft tissue swelling in the neck. Upper chest: Lung apices clear bilaterally. Review of the MIP images confirms the above findings CTA HEAD FINDINGS Anterior circulation: Cavernous carotid widely patent and normal bilaterally. No stenosis or atherosclerotic disease. Negative for aneurysm. Anterior and middle cerebral arteries normal bilaterally without stenosis or aneurysm. Posterior circulation: Both vertebral arteries patent to the basilar. PICA patent bilaterally. Basilar widely patent. Superior cerebellar and posterior cerebral arteries widely patent bilaterally. Negative for aneurysm. Venous sinuses: Normal venous enhancement. Anatomic variants: None Review of the MIP images confirms the above findings IMPRESSION: 1. Small volume subarachnoid hemorrhage on the right is stable. No new hemorrhage identified. 2. Nondisplaced fracture left temporal bone with overlying soft tissue swelling in the scalp. Findings suggest significant head trauma with contrecoup subarachnoid hemorrhage on the right. 3. Negative for cerebral aneurysm 4. No significant intracranial or extracranial arterial stenosis. 5. These results were called by telephone at the time of interpretation on 10/08/2019 at 1:40 pm to provider JOSEPH ZAMMIT , who verbally acknowledged these results. Electronically Signed   By: Marlan Palau M.D.   On: 10/08/2019 13:42   CT Chest W  Contrast  Result Date: 10/08/2019 CLINICAL DATA:  Fall, left-sided rib pain. History of appendectomy EXAM: CT CHEST, ABDOMEN, AND PELVIS WITH CONTRAST TECHNIQUE: Multidetector CT imaging of the chest, abdomen and pelvis was performed following the standard protocol during bolus administration of  intravenous contrast. CONTRAST:  OMNIPAQUE IOHEXOL 300 MG/ML  SOLN COMPARISON:  CT chest 09/21/2017, CT abdomen-pelvis 08/25/2019 FINDINGS: CT CHEST FINDINGS Cardiovascular: Normal heart size. No pericardial effusion. Thoracic aorta is normal in course and caliber. Pulmonary trunk is nondilated. No significant vascular findings identified. Mediastinum/Nodes: No enlarged mediastinal, hilar, or axillary lymph nodes. Thyroid gland, trachea, and esophagus demonstrate no significant findings. Lungs/Pleura: Lungs are clear. No pleural effusion or pneumothorax. Musculoskeletal: Healed fractures of the posterior right eighth, ninth, and tenth ribs. Normal spinal alignment. Vertebral body heights are maintained. No acute osseous findings. No chest wall hematoma or other abnormality. CT ABDOMEN PELVIS FINDINGS Hepatobiliary: Diffusely decreased attenuation of the hepatic parenchyma compatible with hepatic steatosis. No focal hepatic injury or pericapsular collection. Gallbladder unremarkable. No gallstone or biliary dilatation. Pancreas: Unremarkable. No pancreatic ductal dilatation or surrounding inflammatory changes. Spleen: No splenic injury or perisplenic hematoma. Adrenals/Urinary Tract: No adrenal hemorrhage or renal injury identified. Bladder is unremarkable. Stomach/Bowel: Stomach is within normal limits. Appendix surgically absent. Scattered colonic diverticulosis. No evidence of bowel wall thickening, distention, or inflammatory changes. Vascular/Lymphatic: No significant vascular findings are present. No enlarged abdominal or pelvic lymph nodes. Reproductive: Prostate is unremarkable. Other: Small fat containing umbilical hernia. No abdominopelvic ascites. Musculoskeletal: Induration within the subcutaneous tissues overlying the left hip and gluteal region with focal hyperdense fluid collection measuring 5.6 x 1.7 x 6.5 cm (volume = 32 cm^3) suggestive of a hematoma (series 2, image 117). Pelvic bony ring is  intact without fracture. Bilateral hip joints intact without fracture or dislocation. Lumbar vertebral body heights are maintained without fracture or static listhesis. Intervertebral disc height loss of L5-S1. IMPRESSION: 1. Soft tissue hematoma overlying the left hip/gluteal region measuring up to 6.5 cm. 2. No acute osseous abnormality. 3. No acute findings within the chest. No solid organ or visceral injury within the abdomen or pelvis. 4. Hepatic steatosis. Electronically Signed   By: Duanne Guess D.O.   On: 10/08/2019 10:59   CT Cervical Spine Wo Contrast  Result Date: 10/08/2019 CLINICAL DATA:  Larey Seat down steps. Loss of consciousness. EXAM: CT HEAD WITHOUT CONTRAST CT CERVICAL SPINE WITHOUT CONTRAST TECHNIQUE: Multidetector CT imaging of the head and cervical spine was performed following the standard protocol without intravenous contrast. Multiplanar CT image reconstructions of the cervical spine were also generated. COMPARISON:  None. FINDINGS: CT HEAD FINDINGS Brain: Acute subarachnoid hemorrhage is seen in the right temporal lobe and temporoparietal region, well demonstrated on axial images 13, 14 of series 2 and coronal images 36 through 39 of series 4. No midline shift. No hydrocephalus. No mass lesion evident. Vascular: No hyperdense vessel or unexpected calcification. Skull: No evidence for fracture. No worrisome lytic or sclerotic lesion. Chronic deformity noted in the mandibular condyles bilaterally with bilateral TMJ subluxation. Sinuses/Orbits: Near complete opacification noted in the bilateral frontal sinuses, bilateral ethmoid air cells, and right maxillary sinus. Extensive chronic mucosal disease noted in the left maxillary sinus. Mastoid air cells are clear bilaterally. Other: None. CT CERVICAL SPINE FINDINGS Alignment: Normal. Skull base and vertebrae: No acute fracture. No primary bone lesion or focal pathologic process. Soft tissues and spinal canal: No prevertebral fluid or  swelling. No visible canal hematoma. Disc levels:  Preserved throughout. Upper chest:  Unremarkable. Other: None. IMPRESSION: 1. Acute subarachnoid hemorrhage identified in the right temporal and temporoparietal region. No associated mass effect or midline shift. 2. No cervical spine fracture. 3. Chronic paranasal sinusitis. 4. Chronic deformity of the mandibular condyles bilaterally, potentially posttraumatic or degenerative with subluxation of the bilateral temporomandibular joints. Critical Value/emergent results were called by telephone at the time of interpretation on 10/08/2019 at 10:52 am to provider [facility] ZAMMIT , who verbally acknowledged these results. Electronically Signed   By: Misty Stanley M.D.   On: 10/08/2019 10:53   CT ABDOMEN PELVIS W CONTRAST  Result Date: 10/08/2019 CLINICAL DATA:  Fall, left-sided rib pain. History of appendectomy EXAM: CT CHEST, ABDOMEN, AND PELVIS WITH CONTRAST TECHNIQUE: Multidetector CT imaging of the chest, abdomen and pelvis was performed following the standard protocol during bolus administration of intravenous contrast. CONTRAST:  185mL OMNIPAQUE IOHEXOL 300 MG/ML  SOLN COMPARISON:  CT chest 09/21/2017, CT abdomen-pelvis 08/25/2019 FINDINGS: CT CHEST FINDINGS Cardiovascular: Normal heart size. No pericardial effusion. Thoracic aorta is normal in course and caliber. Pulmonary trunk is nondilated. No significant vascular findings identified. Mediastinum/Nodes: No enlarged mediastinal, hilar, or axillary lymph nodes. Thyroid gland, trachea, and esophagus demonstrate no significant findings. Lungs/Pleura: Lungs are clear. No pleural effusion or pneumothorax. Musculoskeletal: Healed fractures of the posterior right eighth, ninth, and tenth ribs. Normal spinal alignment. Vertebral body heights are maintained. No acute osseous findings. No chest wall hematoma or other abnormality. CT ABDOMEN PELVIS FINDINGS Hepatobiliary: Diffusely decreased attenuation of the hepatic  parenchyma compatible with hepatic steatosis. No focal hepatic injury or pericapsular collection. Gallbladder unremarkable. No gallstone or biliary dilatation. Pancreas: Unremarkable. No pancreatic ductal dilatation or surrounding inflammatory changes. Spleen: No splenic injury or perisplenic hematoma. Adrenals/Urinary Tract: No adrenal hemorrhage or renal injury identified. Bladder is unremarkable. Stomach/Bowel: Stomach is within normal limits. Appendix surgically absent. Scattered colonic diverticulosis. No evidence of bowel wall thickening, distention, or inflammatory changes. Vascular/Lymphatic: No significant vascular findings are present. No enlarged abdominal or pelvic lymph nodes. Reproductive: Prostate is unremarkable. Other: Small fat containing umbilical hernia. No abdominopelvic ascites. Musculoskeletal: Induration within the subcutaneous tissues overlying the left hip and gluteal region with focal hyperdense fluid collection measuring 5.6 x 1.7 x 6.5 cm (volume = 32 cm^3) suggestive of a hematoma (series 2, image 117). Pelvic bony ring is intact without fracture. Bilateral hip joints intact without fracture or dislocation. Lumbar vertebral body heights are maintained without fracture or static listhesis. Intervertebral disc height loss of L5-S1. IMPRESSION: 1. Soft tissue hematoma overlying the left hip/gluteal region measuring up to 6.5 cm. 2. No acute osseous abnormality. 3. No acute findings within the chest. No solid organ or visceral injury within the abdomen or pelvis. 4. Hepatic steatosis. Electronically Signed   By: Davina Poke D.O.   On: 10/08/2019 10:59   DG Shoulder Left  Result Date: 10/08/2019 CLINICAL DATA:  Fall, pain EXAM: LEFT SHOULDER - 2+ VIEW COMPARISON:  None. FINDINGS: There is no evidence of fracture or dislocation. There is no evidence of arthropathy or other focal bone abnormality. Soft tissues are unremarkable. IMPRESSION: No fracture or dislocation of the left  shoulder. Joint spaces are preserved. Electronically Signed   By: Eddie Candle M.D.   On: 10/08/2019 10:11   ECHOCARDIOGRAM COMPLETE  Result Date: 10/09/2019    ECHOCARDIOGRAM REPORT   Patient Name:   ALEKSANDAR DUVE Date of Exam: 10/09/2019 Medical Rec #:  [id number]          Height:  72.0 in Accession #:    [id number]         Weight:       205.0 lb Date of Birth:  24-Jun-1976          BSA:          2.153 m Patient Age:    43 years           BP:           119/85 mmHg Patient Gender: M                  HR:           76 bpm. Exam Location:  Inpatient Procedure: 2D Echo and Intracardiac Opacification Agent Indications:    Syncope 780.2 / R55  History:        Patient has no prior history of Echocardiogram examinations. No                 prior cardiac history.  Sonographer:    Leeroy Bock Turrentine Referring Phys: [id number] Heloise Beecham EMOKPAE IMPRESSIONS  1. Left ventricular ejection fraction, by estimation, is <20%. The left ventricle has severely decreased function. The left ventricle demonstrates global hypokinesis. The left ventricular internal cavity size was severely dilated. Left ventricular diastolic parameters were normal.  2. Right ventricular systolic function is normal. The right ventricular size is normal.  3. The mitral valve is normal in structure and function. Trivial mitral valve regurgitation. No evidence of mitral stenosis.  4. The aortic valve is normal in structure and function. Aortic valve regurgitation is not visualized. No aortic stenosis is present.  5. The inferior vena cava is normal in size with greater than 50% respiratory variability, suggesting right atrial pressure of 3 mmHg. FINDINGS  Left Ventricle: Left ventricular ejection fraction, by estimation, is <20%. The left ventricle has severely decreased function. The left ventricle demonstrates global hypokinesis. Definity contrast agent was given IV to delineate the left ventricular endocardial borders. The left ventricular internal  cavity size was severely dilated. There is no left ventricular hypertrophy. Left ventricular diastolic parameters were normal. Right Ventricle: The right ventricular size is normal. No increase in right ventricular wall thickness. Right ventricular systolic function is normal. Left Atrium: Left atrial size was normal in size. Right Atrium: Right atrial size was normal in size. Pericardium: There is no evidence of pericardial effusion. Mitral Valve: The mitral valve is normal in structure and function. Normal mobility of the mitral valve leaflets. Trivial mitral valve regurgitation. No evidence of mitral valve stenosis. Tricuspid Valve: The tricuspid valve is normal in structure. Tricuspid valve regurgitation is trivial. No evidence of tricuspid stenosis. Aortic Valve: The aortic valve is normal in structure and function. Aortic valve regurgitation is not visualized. No aortic stenosis is present. Pulmonic Valve: The pulmonic valve was normal in structure. Pulmonic valve regurgitation is not visualized. No evidence of pulmonic stenosis. Aorta: The aortic root is normal in size and structure. Venous: The inferior vena cava is normal in size with greater than 50% respiratory variability, suggesting right atrial pressure of 3 mmHg. IAS/Shunts: No atrial level shunt detected by color flow Doppler.  LEFT VENTRICLE PLAX 2D LVIDd:         6.00 cm  Diastology LVIDs:         5.20 cm  LV e' lateral:   10.90 cm/s LV PW:         0.80 cm  LV E/e' lateral: 6.4 LV IVS:  0.80 cm  LV e' medial:    5.11 cm/s LVOT diam:     2.30 cm  LV E/e' medial:  13.7 LV SV:         52 LV SV Index:   24 LVOT Area:     4.15 cm  RIGHT VENTRICLE RV S prime:     12.60 cm/s TAPSE (M-mode): 1.9 cm LEFT ATRIUM             Index       RIGHT ATRIUM           Index LA diam:        3.70 cm 1.72 cm/m  RA Area:     13.90 cm LA Vol (A2C):   66.2 ml 30.75 ml/m RA Volume:   37.60 ml  17.46 ml/m LA Vol (A4C):   59.6 ml 27.68 ml/m LA Biplane Vol: 69.6 ml  32.33 ml/m  AORTIC VALVE LVOT Vmax:   70.70 cm/s LVOT Vmean:  51.400 cm/s LVOT VTI:    0.126 m  AORTA Ao Root diam: 2.80 cm MITRAL VALVE MV Area (PHT): 4.60 cm    SHUNTS MV Decel Time: 165 msec    Systemic VTI:  0.13 m MV E velocity: 69.80 cm/s  Systemic Diam: 2.30 cm MV A velocity: 48.70 cm/s MV E/A ratio:  1.43 Charlton Haws MD Electronically signed by Charlton Haws MD Signature Date/Time: 10/09/2019/9:22:13 AM    Final       Scheduled Meds: Continuous Infusions:   LOS: 1 day      Time spent: 35 minutes   Noralee Stain, DO [facility] 10/09/2019, 1:23 PM   Available via Epic secure chat 7am-7pm After these hours, please refer to coverage provider listed on [email or web address]

## 2019-10-09 NOTE — Progress Notes (Signed)
  Echocardiogram 2D Echocardiogram has been performed.  Andrew Hinton Andrew Hinton 10/09/2019, 8:58 AM

## 2019-10-09 NOTE — Consult Note (Addendum)
Advanced Heart Failure Team Consult Note   Primary Physician: Patient, No Pcp Per PCP-Cardiologist:  No primary care provider on file.  Reason for Consultation: New Acute Systolic Heart Failure  HPI:    Andrew Hinton is seen today for evaluation of new acute systolic heart failure at the request of Dr Alvino Chapel.   Andrew Hinton is a 44 year old with history of anxiety, asthma, and depression. Previously a heavy drinker and smoker until 2 years ago. He now drinks 1/5 liquor a day.  No longer has drivers license due to multiple DWIs.   Over the 2 months he has been noticed extreme fatigue and shortness of breath. He has been COVID negative. He does not recall viral illness.   Yesterday he had unwitnessed syncopal event and fell from the top of his stairs to the bottom. His son found him and started shaking him. Says he had some dizziness prior to the episode. He has been having diarrhea for the last few weeks.  Denies chest pain.    EMS called and brought him to [facility].  CTA head subarachnoid hemorrhage right temporal and temporoparietal area,no midline shift or mass-effect, nondisplaced fracture left temporal bone.  Echo completed and showed severely reduced LV EF < 20%, global HK, RV normal.  Pertinent admission labs included: UDS + tetrahydrocannabinol, SARS2 negative, TSH 0.778. HIV negative , and HS Trop 11>11.  SH: Does not drive. No longer smokes. Drinks 1/5 liquor on the weekend. Lives with son, mother in law, and father in Social worker. Difficulty paying for medications. Work on cars full time.   FH: Mom and 2 sister have heart disease. He has not idea what kind of heart problems. Mother deceased ? Cancer.   Review of Systems: [y] = yes, [ ]  = no   . General: Weight gain [ ] ; Weight loss [ ] ; Anorexia [ ] ; Fatigue [Y ]; Fever [ ] ; Chills [ ] ; Weakness [ Y]  . Cardiac: Chest pain/pressure [ ] ; Resting SOB [ ] ; Exertional SOB [ Y]; Orthopnea [ ] ; Pedal Edema [ ] ; Palpitations [ ] ; Syncope [Y  ]; Presyncope [Y ]; Paroxysmal nocturnal dyspnea[ ]   . Pulmonary: Cough [ ] ; Wheezing[ ] ; Hemoptysis[ ] ; Sputum [ ] ; Snoring [ ]   . GI: Vomiting[ ] ; Dysphagia[ ] ; Melena[ ] ; Hematochezia [ ] ; Heartburn[ ] ; Abdominal pain [ ] ; Constipation [ ] ; Diarrhea [ ] ; BRBPR [ ]   . GU: Hematuria[ ] ; Dysuria [ ] ; Nocturia[ ]   . Vascular: Pain in legs with walking [ ] ; Pain in feet with lying flat [ ] ; Non-healing sores [ ] ; Stroke [ ] ; TIA [ ] ; Slurred speech [ ] ;  . Neuro: Headaches[ ] ; Vertigo[ ] ; Seizures[ ] ; Paresthesias[ ] ;Blurred vision [ ] ; Diplopia [ ] ; Vision changes [ ]   . Ortho/Skin: Arthritis [ ] ; Joint pain [Y ]; Muscle pain [ ] ; Joint swelling [ ] ; Back Pain [ Y]; Rash [ ]   . Psych: Depression[Y ]; Anxiety[ ]   . Heme: Bleeding problems [ ] ; Clotting disorders [ ] ; Anemia [ ]   . Endocrine: Diabetes [ ] ; Thyroid dysfunction[ ]   Home Medications Prior to Admission medications   Medication Sig Start Date End Date Taking? Authorizing Provider  dicyclomine (BENTYL) 20 MG tablet Take 1 tablet (20 mg total) by mouth 2 (two) times daily. Patient not taking: Reported on 10/08/2019 08/25/19   , PA-C  ibuprofen (MOTRIN IB) 200 MG tablet Take 200 mg by mouth every 6 (six) hours as needed for  fever or moderate pain.    [provider]  ondansetron (ZOFRAN ODT) 4 MG disintegrating tablet Take 1 tablet (4 mg total) by mouth every 8 (eight) hours as needed for nausea or vomiting. Patient not taking: Reported on 10/08/2019 08/25/19   Joy, Ines Bloomer C, PA-C  Phenyleph-Doxylamine-DM-APAP (NYQUIL SEVERE COLD/FLU) 5-6.25-10-325 MG/15ML LIQD Take 15 mLs by mouth daily as needed (FOR FLU LIKE SYMPTOMS).    [provider]  promethazine (PHENERGAN) 25 MG tablet Take 1 tablet (25 mg total) by mouth every 6 (six) hours as needed for nausea or vomiting. Patient not taking: Reported on 10/08/2019 11/06/18   Victoriano Lain    Past Medical History: Past Medical History:  Diagnosis Date  . Anxiety    . Asthma   . Depression   . Nerve damage     Past Surgical History: Past Surgical History:  Procedure Laterality Date  . APPENDECTOMY      Family History: History reviewed. No pertinent family history.  Social History: Social History   Socioeconomic History  . Marital status: Divorced    Spouse name: Not on file  . Number of children: Not on file  . Years of education: Not on file  . Highest education level: Not on file  Occupational History  . Not on file  Tobacco Use  . Smoking status: Former Smoker    Packs/day: 0.50    Types: Cigarettes    Quit date: 10/07/2017    Years since quitting: 2.0  . Smokeless tobacco: Never Used  Substance and Sexual Activity  . Alcohol use: Yes    Comment: occasionally   . Drug use: No  . Sexual activity: Not on file  Other Topics Concern  . Not on file  Social History Narrative  . Not on file   Social Determinants of Health   Financial Resource Strain:   . Difficulty of Paying Living Expenses: Not on file  Food Insecurity:   . Worried About Programme researcher, broadcasting/film/video in the Last Year: Not on file  . Ran Out of Food in the Last Year: Not on file  Transportation Needs:   . Lack of Transportation (Medical): Not on file  . Lack of Transportation (Non-Medical): Not on file  Physical Activity:   . Days of Exercise per Week: Not on file  . Minutes of Exercise per Session: Not on file  Stress:   . Feeling of Stress : Not on file  Social Connections:   . Frequency of Communication with Friends and Family: Not on file  . Frequency of Social Gatherings with Friends and Family: Not on file  . Attends Religious Services: Not on file  . Active Member of Clubs or Organizations: Not on file  . Attends Banker Meetings: Not on file  . Marital Status: Not on file    Allergies:  No Known Allergies  Objective:    Vital Signs:   Temp:  [97.5 F (36.4 C)-98.4 F (36.9 C)] 97.6 F (36.4 C) (02/26 1202) Pulse Rate:  [71-106]  72 (02/26 1202) Resp:  [11-19] 19 (02/26 1202) BP: (101-141)/(73-112) 105/74 (02/26 1202) SpO2:  [94 %-98 %] 98 % (02/26 1202) Last BM Date: 10/08/19  Weight change: Filed Weights   10/08/19 0811  Weight: 93 kg    Intake/Output:   Intake/Output Summary (Last 24 hours) at 10/09/2019 1421 Last data filed at 10/09/2019 1200 Gross per 24 hour  Intake 1008.6 ml  Output --  Net 1008.6 ml  Physical Exam    General:   No resp difficulty HEENT: Poor dentition Neck: supple. JVP flat . Carotids 2+ bilat; no bruits. No lymphadenopathy or thyromegaly appreciated. Cor: PMI nondisplaced. Regular rate & rhythm. No rubs, gallops or murmurs. Lungs: clear Abdomen: soft, nontender, nondistended. No hepatosplenomegaly. No bruits or masses. Good bowel sounds. Extremities: no cyanosis, clubbing, rash, edema Neuro: alert & orientedx3, cranial nerves grossly intact. moves all 4 extremities w/o difficulty. Affect pleasant   Telemetry  SR 90s   EKG   SR 96 bpm   Labs   Basic Metabolic Panel: Recent Labs  Lab 10/08/19 0908 10/08/19 1537 10/09/19 0311  NA 141  --  138  K 4.1  --  4.0  CL 108  --  101  CO2 23  --  27  GLUCOSE 102*  --  97  BUN 18  --  18  CREATININE 0.94  --  1.46*  CALCIUM 8.8*  --  8.9  MG  --  2.2  --     Liver Function Tests: Recent Labs  Lab 10/08/19 0908 10/09/19 0311  AST 50* 29  ALT 86* 65*  ALKPHOS 51 52  BILITOT 0.6 0.8  PROT 7.5 6.5  ALBUMIN 4.1 3.5   No results for input(s): LIPASE, AMYLASE in the last 168 hours. No results for input(s): AMMONIA in the last 168 hours.  CBC: Recent Labs  Lab 10/08/19 0908 10/09/19 0311  WBC 7.6 8.6  NEUTROABS 5.4  --   HGB 14.0 13.6  HCT 42.3 41.0  MCV 96.4 96.7  PLT 332 329    Cardiac Enzymes: No results for input(s): CKTOTAL, CKMB, CKMBINDEX, TROPONINI in the last 168 hours.  BNP: BNP (last 3 results) No results for input(s): BNP in the last 8760 hours.  ProBNP (last 3 results) No  results for input(s): PROBNP in the last 8760 hours.   CBG: No results for input(s): GLUCAP in the last 168 hours.  Coagulation Studies: No results for input(s): LABPROT, INR in the last 72 hours.   Imaging   CT HEAD WO CONTRAST  Result Date: 10/09/2019 CLINICAL DATA:  Intracranial hemorrhage. Trauma. EXAM: CT HEAD WITHOUT CONTRAST TECHNIQUE: Contiguous axial images were obtained from the base of the skull through the vertex without intravenous contrast. COMPARISON:  CT 10/08/2019 FINDINGS: Brain: Again demonstrated small volume subarachnoid hemorrhage in the RIGHT temporal lobe. No increased hemorrhage compared to exam 1 day prior. No midline shift or mass effect. No intraventricular hemorrhage. Vascular: No hyperdense vessel or unexpected calcification. Skull: Subtle nondisplaced temporal bone fracture (squamous portion) on the LEFT again noted (image 38/4 and image 22/4). Associated scalp hematoma over the LEFT parietal bone stable thickness at 6 mm. Sinuses/Orbits: Dense opacification of the paranasal sinuses consistent chronic sinusitis. Orbits normal Other: IMPRESSION: 1. No significant change in small volume subarachnoid hemorrhage in the RIGHT temporal lobe. No midline shift or mass effect. 2. No increased hemorrhage compared to exam 1 day prior. 3. Stable nondisplaced LEFT temporal bone fracture. Electronically Signed   By: Genevive Bi M.D.   On: 10/09/2019 09:13   ECHOCARDIOGRAM COMPLETE  Result Date: 10/09/2019    ECHOCARDIOGRAM REPORT   Patient Name:   Andrew Hinton Date of Exam: 10/09/2019 Medical Rec #:  [id number]          Height:       72.0 in Accession #:    [id number]         Weight:       205.0 lb  Date of Birth:  07-02-1976          BSA:          2.153 m Patient Age:    43 years           BP:           119/85 mmHg Patient Gender: M                  HR:           76 bpm. Exam Location:  Inpatient Procedure: 2D Echo and Intracardiac Opacification Agent Indications:     Syncope 780.2 / R55  History:        Patient has no prior history of Echocardiogram examinations. No                 prior cardiac history.  Sonographer:    Leeroy Bock Turrentine Referring Phys: [id number] Heloise Beecham EMOKPAE IMPRESSIONS  1. Left ventricular ejection fraction, by estimation, is <20%. The left ventricle has severely decreased function. The left ventricle demonstrates global hypokinesis. The left ventricular internal cavity size was severely dilated. Left ventricular diastolic parameters were normal.  2. Right ventricular systolic function is normal. The right ventricular size is normal.  3. The mitral valve is normal in structure and function. Trivial mitral valve regurgitation. No evidence of mitral stenosis.  4. The aortic valve is normal in structure and function. Aortic valve regurgitation is not visualized. No aortic stenosis is present.  5. The inferior vena cava is normal in size with greater than 50% respiratory variability, suggesting right atrial pressure of 3 mmHg. FINDINGS  Left Ventricle: Left ventricular ejection fraction, by estimation, is <20%. The left ventricle has severely decreased function. The left ventricle demonstrates global hypokinesis. Definity contrast agent was given IV to delineate the left ventricular endocardial borders. The left ventricular internal cavity size was severely dilated. There is no left ventricular hypertrophy. Left ventricular diastolic parameters were normal. Right Ventricle: The right ventricular size is normal. No increase in right ventricular wall thickness. Right ventricular systolic function is normal. Left Atrium: Left atrial size was normal in size. Right Atrium: Right atrial size was normal in size. Pericardium: There is no evidence of pericardial effusion. Mitral Valve: The mitral valve is normal in structure and function. Normal mobility of the mitral valve leaflets. Trivial mitral valve regurgitation. No evidence of mitral valve stenosis. Tricuspid  Valve: The tricuspid valve is normal in structure. Tricuspid valve regurgitation is trivial. No evidence of tricuspid stenosis. Aortic Valve: The aortic valve is normal in structure and function. Aortic valve regurgitation is not visualized. No aortic stenosis is present. Pulmonic Valve: The pulmonic valve was normal in structure. Pulmonic valve regurgitation is not visualized. No evidence of pulmonic stenosis. Aorta: The aortic root is normal in size and structure. Venous: The inferior vena cava is normal in size with greater than 50% respiratory variability, suggesting right atrial pressure of 3 mmHg. IAS/Shunts: No atrial level shunt detected by color flow Doppler.  LEFT VENTRICLE PLAX 2D LVIDd:         6.00 cm  Diastology LVIDs:         5.20 cm  LV e' lateral:   10.90 cm/s LV PW:         0.80 cm  LV E/e' lateral: 6.4 LV IVS:        0.80 cm  LV e' medial:    5.11 cm/s LVOT diam:     2.30 cm  LV E/e'  medial:  13.7 LV SV:         52 LV SV Index:   24 LVOT Area:     4.15 cm  RIGHT VENTRICLE RV S prime:     12.60 cm/s TAPSE (M-mode): 1.9 cm LEFT ATRIUM             Index       RIGHT ATRIUM           Index LA diam:        3.70 cm 1.72 cm/m  RA Area:     13.90 cm LA Vol (A2C):   66.2 ml 30.75 ml/m RA Volume:   37.60 ml  17.46 ml/m LA Vol (A4C):   59.6 ml 27.68 ml/m LA Biplane Vol: 69.6 ml 32.33 ml/m  AORTIC VALVE LVOT Vmax:   70.70 cm/s LVOT Vmean:  51.400 cm/s LVOT VTI:    0.126 m  AORTA Ao Root diam: 2.80 cm MITRAL VALVE MV Area (PHT): 4.60 cm    SHUNTS MV Decel Time: 165 msec    Systemic VTI:  0.13 m MV E velocity: 69.80 cm/s  Systemic Diam: 2.30 cm MV A velocity: 48.70 cm/s MV E/A ratio:  1.43 Jenkins Rouge MD Electronically signed by Jenkins Rouge MD Signature Date/Time: 10/09/2019/9:22:13 AM    Final       Medications:     Current Medications:   Infusions:     Assessment/Plan   1. Syncope ECHO - valves ok.  ? Possible due to dehydration with recent diarrhea.  ?Possible arrhythmia. No  arrhythmias since admit but given EF severely reduced may need Life Vest at DC.   2. SAH CTA -volume subarachnoid hemorrhage right temporal and temporoparietal area,no midline shift or mass-effect, nondisplaced fracture left temporal bone.   - Not on antiplatelets or anticoagulation.  3. Acute Systolic HF  ECHO EF < 25%. ? Possible ETOH . Would also benefit from cMRI Eventually will need cath to further evaluate but with [facility] will need to hold off.  - Check orthostatics. HF meds will be limited given ongoing dizziness.  - Volume status appears stable.  - Hold off bb/arb with ongoing dizziness.   3. Asthma   4. H/O ETOH abuse Discussed alcohol cessation.   5. Elevated LFTs Noted on admit  CT Abdomen 2/25  -hepatin steatosis  Length of Stay: 1  Amy Clegg, NP  10/09/2019, 2:21 PM  Advanced Heart Failure Team Pager [phone] (M-F; 7a - 4p)  Please contact [facility] for night-coverage after hours (4p -7a ) and weekends on [email or web address]  Patient seen with NP, agree with the above note.   He was admitted after syncope + fall down stairs with small volume SAH.  Prior to syncope, he felt his heart racing.  Per neurosurgery, plan to observe only.  Due to syncope, echo was done showing EF < 20% with preserved RV function.   Patient reports exertional dyspnea for about a month prior to admission.  He also gets lightheaded with standing.  He has had orthostatic-type symptoms for several weeks as well but has not had any other falls or syncope.  No chest pain.  He used to be a heavy drinker but cut back about 2 years ago to only drinking on the weekend.  Family history of "heart problems" but he does not have further details.    General: NAD Neck: JVP 8-9 cm, no thyromegaly or thyroid nodule.  Lungs: Clear to auscultation bilaterally with normal respiratory effort. CV: Lateral PMI.  Heart regular S1/S2, no S3/S4, no murmur.  No peripheral edema.  No carotid bruit.  Normal pedal pulses.    Abdomen: Soft, nontender, no hepatosplenomegaly, no distention.  Skin: Intact without lesions or rashes.  Neurologic: Alert and oriented x 3.  Psych: Normal affect. Extremities: No clubbing or cyanosis.  HEENT: Normal.   1. Syncope: Patient does have orthostatic-type symptoms, his syncope could have been an orthostatic event.  However, with markedly low EF and the feeling of his heart racing prior to syncope, I am concerned for VT/VF.  No events on telemetry in the hospital.  - He will need a Lifevest at discharge.  2. Cardiomyopathy: Newly found, uncertain etiology.  The LV is dilated with EF < 20%, the RV appears normal.  Given the LV dilation, I suspect that the cardiomyopathy is not new (probably less likely to be a stress cardiomyopathy related to the subarachnoid hemorrhage). He was a heavy drinker at one point but not recently, possible ETOH cardiomyopathy but would have had to be long-standing and just now coming to light, as he reports no heavy drinking for 2 years.  Cannot rule out viral cardiomyopathy or CAD.  He has multiple family members with "heart disease" but he does not have details.   He will need investigation for cause of cardiomyopathy and will need to try to get him on meds to treat it.  This is going to be limited for right now by orthostatic symptoms.  He looks possibly mildly volume up on exam.  - Start digoxin 0.125 mg daily.  - would hold off on meds that would affect BP for now, place ted hose.  - His IVC was not dilated on echo though he has some JVD.  With orthostatic-type symptoms, hold off on Lasix for now.  He will need a right heart cath to assess filling pressures and cardiac output.  - I would like to do coronary angiography, diagnostic only given recent small SAH (avoid anticoagulation/antiplatelets).  Would anticipate right/left heart cath Monday, probably use femoral access for coronaries to avoid need for heparin.  - If cath unremarkable, will need cardiac  MRI.  3. SAH: Small volume SAH post-fall.  Per neurosurgery, conservative mangement.   Marca Ancona 10/09/2019 4:37 PM

## 2019-10-09 NOTE — Evaluation (Signed)
Physical Therapy Evaluation Patient Details Name: Andrew Hinton MRN: [id number] DOB: 22-Sep-1975 Today's Date: 10/09/2019   History of Present Illness  Pt is a 43 y.o. M with significant PMH of anxiety, asthma, depression. Presents following a fall down 15 steps to his basement; possible syncopal episode. CT showing small volume SAH right temporal and temporoparietal area and nondisplaced fracture left temporal bone. Pt also with left hip/gluteal region hematoma.   Clinical Impression  Pt admitted with above. Pt reporting rib pain, left eye blurriness, and increased headache with environmental stimuli I.e. bright lights, sounds. Also reporting mild dizziness; orthostatics negative upon assessment. Pt ambulating 75 feet without physical assist. Overall limited in further mobility by headache. Able to functionally locate signage in hallway. Education provided regarding mild TBI/concussion management, cryotherapy, and work restrictions. Don't anticipate need for PT follow up but will continue to follow.     Follow Up Recommendations No PT follow up;Supervision - Intermittent    Equipment Recommendations  None recommended by PT    Recommendations for Other Services       Precautions / Restrictions Precautions Precautions: Fall Restrictions Weight Bearing Restrictions: No      Mobility  Bed Mobility Overal bed mobility: Independent                Transfers Overall transfer level: Independent Equipment used: None                Ambulation/Gait Ambulation/Gait assistance: Supervision Gait Distance (Feet): 75 Feet Assistive device: None Gait Pattern/deviations: Step-through pattern Gait velocity: decreased   General Gait Details: Pt with slower pace, mild dynamic instability but overall limited by increased HA and "seeing white spots," in hallway, so further distance deferred  Stairs            Wheelchair Mobility    Modified Rankin (Stroke Patients  Only) Modified Rankin (Stroke Patients Only) Pre-Morbid Rankin Score: No symptoms Modified Rankin: Moderately severe disability     Balance Overall balance assessment: Mild deficits observed, not formally tested                                           Pertinent Vitals/Pain Pain Assessment: Faces Faces Pain Scale: Hurts whole lot Pain Location: intermittent HA, ribs Pain Descriptors / Indicators: Headache Pain Intervention(s): Limited activity within patient's tolerance;Monitored during session;Ice applied    Home Living Family/patient expects to be discharged to:: Private residence Living Arrangements: Children;Other relatives(2 sons (54, 97 y.o.), mother and father in Sports coach) Available Help at Discharge: Family Type of Home: House Home Access: Stairs to enter   Technical brewer of Steps: 3 Home Layout: Multi-level        Prior Function Level of Independence: Independent         Comments: Works as a Psychologist, educational        Extremity/Trunk Assessment   Upper Extremity Assessment Upper Extremity Assessment: Overall WFL for tasks assessed    Lower Extremity Assessment Lower Extremity Assessment: Overall WFL for tasks assessed    Cervical / Trunk Assessment Cervical / Trunk Assessment: Normal  Communication   Communication: No difficulties  Cognition Arousal/Alertness: Awake/alert Behavior During Therapy: WFL for tasks assessed/performed Overall Cognitive Status: Within Functional Limits for tasks assessed  General Comments      Exercises     Assessment/Plan    PT Assessment Patient needs continued PT services  PT Problem List Decreased activity tolerance;Decreased balance;Decreased mobility;Pain       PT Treatment Interventions Gait training;Stair training;Functional mobility training;Therapeutic activities;Therapeutic exercise;Balance  training;Patient/family education    PT Goals (Current goals can be found in the Care Plan section)  Acute Rehab PT Goals Patient Stated Goal: no headache PT Goal Formulation: With patient Time For Goal Achievement: 10/23/19 Potential to Achieve Goals: Good    Frequency Min 3X/week   Barriers to discharge        Co-evaluation               AM-PAC PT "6 Clicks" Mobility  Outcome Measure Help needed turning from your back to your side while in a flat bed without using bedrails?: None Help needed moving from lying on your back to sitting on the side of a flat bed without using bedrails?: None Help needed moving to and from a bed to a chair (including a wheelchair)?: None Help needed standing up from a chair using your arms (e.g., wheelchair or bedside chair)?: None Help needed to walk in hospital room?: None Help needed climbing 3-5 steps with a railing? : A Little 6 Click Score: 23    End of Session   Activity Tolerance: Patient limited by pain Patient left: in bed;with call bell/phone within reach Nurse Communication: Mobility status PT Visit Diagnosis: Unsteadiness on feet (R26.81);Pain Pain - part of body: (head, ribs)    Time: 1324-4010 PT Time Calculation (min) (ACUTE ONLY): 26 min   Charges:   PT Evaluation $PT Eval Moderate Complexity: 1 Mod PT Treatments $Therapeutic Activity: 8-22 mins          [name], PT, DPT Acute Rehabilitation Services Pager [phone] Office [phone]   [name] 10/09/2019, 9:51 AM

## 2019-10-10 ENCOUNTER — Encounter (HOSPITAL_COMMUNITY): Payer: Self-pay

## 2019-10-10 LAB — MRSA PCR SCREENING: MRSA by PCR: NEGATIVE

## 2019-10-10 LAB — BASIC METABOLIC PANEL
Anion gap: 9 (ref 5–15)
BUN: 16 mg/dL (ref 6–20)
CO2: 27 mmol/L (ref 22–32)
Calcium: 9.1 mg/dL (ref 8.9–10.3)
Chloride: 101 mmol/L (ref 98–111)
Creatinine, Ser: 1.32 mg/dL — ABNORMAL HIGH (ref 0.61–1.24)
GFR calc Af Amer: >60 mL/min (ref >60)
GFR calc non Af Amer: >60 mL/min (ref >60)
Glucose, Bld: 111 mg/dL — ABNORMAL HIGH (ref 70–99)
Potassium: 4.3 mmol/L (ref 3.5–5.1)
Sodium: 137 mmol/L (ref 135–145)

## 2019-10-10 MED ORDER — SPIRONOLACTONE 12.5 MG HALF TABLET
12.5000 mg | ORAL_TABLET | Freq: Every day | ORAL | Status: DC
  Administered 2019-10-10: 12.5 mg via ORAL
  Filled 2019-10-10 (×2): qty 1

## 2019-10-10 MED ORDER — MECLIZINE HCL 12.5 MG PO TABS
12.5000 mg | ORAL_TABLET | Freq: Three times a day (TID) | ORAL | Status: DC | PRN
  Administered 2019-10-11 – 2019-10-14 (×2): 12.5 mg via ORAL
  Filled 2019-10-10 (×2): qty 1

## 2019-10-10 MED ORDER — LOSARTAN POTASSIUM 25 MG PO TABS
12.5000 mg | ORAL_TABLET | Freq: Every day | ORAL | Status: DC
  Administered 2019-10-10: 12.5 mg via ORAL
  Filled 2019-10-10: qty 1

## 2019-10-10 MED ORDER — OXYCODONE-ACETAMINOPHEN 5-325 MG PO TABS
1.0000 | ORAL_TABLET | ORAL | Status: DC | PRN
  Administered 2019-10-10 (×2): 2 via ORAL
  Administered 2019-10-10: 1 via ORAL
  Administered 2019-10-11 (×3): 2 via ORAL
  Administered 2019-10-11: 1 via ORAL
  Administered 2019-10-12: 2 via ORAL
  Filled 2019-10-10 (×6): qty 2
  Filled 2019-10-10 (×2): qty 1

## 2019-10-10 NOTE — Progress Notes (Signed)
Patient ID: Andrew Hinton, male   DOB: 08-05-1976, 44 y.o.   MRN: [id number]     Advanced Heart Failure Rounding Note  PCP-Cardiologist: No primary care provider on file.   Subjective:    Has headache, stable.  Has symptoms consistent with vertigo (spinning/dizziness based on head position).  No dyspnea.    Objective:   Weight Range: 88.6 kg Body mass index is 26.49 kg/m.   Vital Signs:   Temp:  [97.6 F (36.4 C)-98.6 F (37 C)] 97.9 F (36.6 C) (02/27 0756) Pulse Rate:  [67-85] 67 (02/27 0756) Resp:  [12-21] 16 (02/27 0756) BP: (105-131)/(74-91) 123/88 (02/27 0756) SpO2:  [93 %-99 %] 99 % (02/27 0756) Weight:  [88.6 kg] 88.6 kg (02/26 2118) Last BM Date: 10/08/19  Weight change: Filed Weights   10/08/19 0811 10/09/19 2118  Weight: 93 kg 88.6 kg    Intake/Output:   Intake/Output Summary (Last 24 hours) at 10/10/2019 0854 Last data filed at 10/10/2019 0542 Gross per 24 hour  Intake 612.15 ml  Output 750 ml  Net -137.85 ml      Physical Exam    General:  Well appearing. No resp difficulty HEENT: Normal Neck: Supple. JVP 7. Carotids 2+ bilat; no bruits. No lymphadenopathy or thyromegaly appreciated. Cor: PMI nondisplaced. Regular rate & rhythm. No rubs, gallops or murmurs. Lungs: Clear Abdomen: Soft, nontender, nondistended. No hepatosplenomegaly. No bruits or masses. Good bowel sounds. Extremities: No cyanosis, clubbing, rash, edema Neuro: Alert & orientedx3, cranial nerves grossly intact. moves all 4 extremities w/o difficulty. Affect pleasant   Telemetry   NSR, no PVC/VT (personally reviewed)  Labs    CBC Recent Labs    10/08/19 0908 10/09/19 0311  WBC 7.6 8.6  NEUTROABS 5.4  --   HGB 14.0 13.6  HCT 42.3 41.0  MCV 96.4 96.7  PLT 332 119   Basic Metabolic Panel Recent Labs    10/08/19 0908 10/08/19 1537 10/09/19 0311 10/10/19 0257  NA   < >  --  138 137  K   < >  --  4.0 4.3  CL   < >  --  101 101  CO2   < >  --  27 27  GLUCOSE    < >  --  97 111*  BUN   < >  --  18 16  CREATININE   < >  --  1.46* 1.32*  CALCIUM   < >  --  8.9 9.1  MG  --  2.2  --   --    < > = values in this interval not displayed.   Liver Function Tests Recent Labs    10/08/19 0908 10/09/19 0311  AST 50* 29  ALT 86* 65*  ALKPHOS 51 52  BILITOT 0.6 0.8  PROT 7.5 6.5  ALBUMIN 4.1 3.5   No results for input(s): LIPASE, AMYLASE in the last 72 hours. Cardiac Enzymes No results for input(s): CKTOTAL, CKMB, CKMBINDEX, TROPONINI in the last 72 hours.  BNP: BNP (last 3 results) No results for input(s): BNP in the last 8760 hours.  ProBNP (last 3 results) No results for input(s): PROBNP in the last 8760 hours.   D-Dimer No results for input(s): DDIMER in the last 72 hours. Hemoglobin A1C No results for input(s): HGBA1C in the last 72 hours. Fasting Lipid Panel No results for input(s): CHOL, HDL, LDLCALC, TRIG, CHOLHDL, LDLDIRECT in the last 72 hours. Thyroid Function Tests Recent Labs    10/08/19 1537  TSH 0.778  Other results:   Imaging    ECHOCARDIOGRAM COMPLETE  Result Date: 10/09/2019    ECHOCARDIOGRAM REPORT   Patient Name:   Andrew Hinton Date of Exam: 10/09/2019 Medical Rec #:  [id number]          Height:       72.0 in Accession #:    [id number]         Weight:       205.0 lb Date of Birth:  [birth year]          BSA:          2.153 m Patient Age:    43 years           BP:           119/85 mmHg Patient Gender: M                  HR:           76 bpm. Exam Location:  Inpatient Procedure: 2D Echo and Intracardiac Opacification Agent Indications:    Syncope 780.2 / R55  History:        Patient has no prior history of Echocardiogram examinations. No                 prior cardiac history.  Sonographer:    Leeroy Bock Turrentine Referring Phys: [id number] Heloise Beecham EMOKPAE IMPRESSIONS  1. Left ventricular ejection fraction, by estimation, is <20%. The left ventricle has severely decreased function. The left ventricle demonstrates  global hypokinesis. The left ventricular internal cavity size was severely dilated. Left ventricular diastolic parameters were normal.  2. Right ventricular systolic function is normal. The right ventricular size is normal.  3. The mitral valve is normal in structure and function. Trivial mitral valve regurgitation. No evidence of mitral stenosis.  4. The aortic valve is normal in structure and function. Aortic valve regurgitation is not visualized. No aortic stenosis is present.  5. The inferior vena cava is normal in size with greater than 50% respiratory variability, suggesting right atrial pressure of 3 mmHg. FINDINGS  Left Ventricle: Left ventricular ejection fraction, by estimation, is <20%. The left ventricle has severely decreased function. The left ventricle demonstrates global hypokinesis. Definity contrast agent was given IV to delineate the left ventricular endocardial borders. The left ventricular internal cavity size was severely dilated. There is no left ventricular hypertrophy. Left ventricular diastolic parameters were normal. Right Ventricle: The right ventricular size is normal. No increase in right ventricular wall thickness. Right ventricular systolic function is normal. Left Atrium: Left atrial size was normal in size. Right Atrium: Right atrial size was normal in size. Pericardium: There is no evidence of pericardial effusion. Mitral Valve: The mitral valve is normal in structure and function. Normal mobility of the mitral valve leaflets. Trivial mitral valve regurgitation. No evidence of mitral valve stenosis. Tricuspid Valve: The tricuspid valve is normal in structure. Tricuspid valve regurgitation is trivial. No evidence of tricuspid stenosis. Aortic Valve: The aortic valve is normal in structure and function. Aortic valve regurgitation is not visualized. No aortic stenosis is present. Pulmonic Valve: The pulmonic valve was normal in structure. Pulmonic valve regurgitation is not  visualized. No evidence of pulmonic stenosis. Aorta: The aortic root is normal in size and structure. Venous: The inferior vena cava is normal in size with greater than 50% respiratory variability, suggesting right atrial pressure of 3 mmHg. IAS/Shunts: No atrial level shunt detected by color flow Doppler.  LEFT VENTRICLE PLAX 2D LVIDd:  6.00 cm  Diastology LVIDs:         5.20 cm  LV e' lateral:   10.90 cm/s LV PW:         0.80 cm  LV E/e' lateral: 6.4 LV IVS:        0.80 cm  LV e' medial:    5.11 cm/s LVOT diam:     2.30 cm  LV E/e' medial:  13.7 LV SV:         52 LV SV Index:   24 LVOT Area:     4.15 cm  RIGHT VENTRICLE RV S prime:     12.60 cm/s TAPSE (M-mode): 1.9 cm LEFT ATRIUM             Index       RIGHT ATRIUM           Index LA diam:        3.70 cm 1.72 cm/m  RA Area:     13.90 cm LA Vol (A2C):   66.2 ml 30.75 ml/m RA Volume:   37.60 ml  17.46 ml/m LA Vol (A4C):   59.6 ml 27.68 ml/m LA Biplane Vol: 69.6 ml 32.33 ml/m  AORTIC VALVE LVOT Vmax:   70.70 cm/s LVOT Vmean:  51.400 cm/s LVOT VTI:    0.126 m  AORTA Ao Root diam: 2.80 cm MITRAL VALVE MV Area (PHT): 4.60 cm    SHUNTS MV Decel Time: 165 msec    Systemic VTI:  0.13 m MV E velocity: 69.80 cm/s  Systemic Diam: 2.30 cm MV A velocity: 48.70 cm/s MV E/A ratio:  1.43 Charlton Haws MD Electronically signed by Charlton Haws MD Signature Date/Time: 10/09/2019/9:22:13 AM    Final       Medications:     Scheduled Medications: . digoxin  0.125 mg Oral Daily  . losartan  12.5 mg Oral Daily  . sodium chloride flush  3 mL Intravenous Q12H  . spironolactone  12.5 mg Oral Daily     Infusions: . sodium chloride    . sodium chloride 10 mL/hr at 10/10/19 0520     PRN Medications:  sodium chloride, acetaminophen **OR** acetaminophen, meclizine, morphine injection, ondansetron **OR** ondansetron (ZOFRAN) IV, oxyCODONE-acetaminophen, sodium chloride flush   Assessment/Plan   1. Syncope: Patient had some orthostatic symptoms at home,  possible that syncope was an orthostatic event.  However, with markedly low EF and the feeling of his heart racing prior to syncope, I am concerned for VT/VF.  No events on telemetry in the hospital.  - He will need a Lifevest at discharge.  2. Cardiomyopathy: Newly found, uncertain etiology.  The LV is dilated with EF < 20%, the RV appears normal.  Given the LV dilation, I suspect that the cardiomyopathy is not new (probably less likely to be a stress cardiomyopathy related to the subarachnoid hemorrhage). He was a heavy drinker at one point but not recently, possible ETOH cardiomyopathy but would have had to be long-standing and just now coming to light, as he reports no heavy drinking for 2 years.  Cannot rule out viral cardiomyopathy or CAD.  He has multiple family members with "heart disease" but he does not have details.   He will need investigation for cause of cardiomyopathy and will need to try to get him on meds to treat it.  His symptoms currently seem to be due to vertigo and not orthostasis. He is not volume overloaded on exam.   - Continue digoxin 0.125 mg daily. -  Will try him on losartan 12.5 daily and spironolactone 12.5 mg daily.  I think that his dizziness is from vertigo and not orthostasis.  BP stable today.  - I would like to do left/right heart cath, diagnostic only given recent small SAH (avoid anticoagulation/antiplatelets).  Would anticipate right/left heart cath Monday, probably use femoral access for coronaries to avoid need for heparin. We discussed risks/benefits and he agrees to procedure.  - If cath unremarkable, will need cardiac MRI.  3. SAH: Small volume SAH post-fall.  Per neurosurgery, conservative mangement.   Length of Stay: 2  Marca Ancona, MD  10/10/2019, 8:54 AM  Advanced Heart Failure Team Pager [phone] (M-F; 7a - 4p)  Please contact [facility] for night-coverage after hours (4p -7a ) and weekends on [email or web address]

## 2019-10-10 NOTE — Evaluation (Signed)
Occupational Therapy Evaluation Patient Details Name: Andrew Hinton MRN: [id number] DOB: Dec 21, 1975 Today's Date: 10/10/2019    History of Present Illness Pt is a 44 y.o. M with significant PMH of anxiety, asthma, depression. Presents following a fall down 15 steps to his basement; possible syncopal episode. CT showing small volume SAH right temporal and temporoparietal area and nondisplaced fracture left temporal bone. Pt also with left hip/gluteal region hematoma.    Clinical Impression   This 44 yo male admitted with above presents to acute OT with PLOF totally independent with basic and IADLs. Pt now is minguard A when up on his feet due to intermittent dizziness. He will continue to benefit from acute OT without need for followup.    Follow Up Recommendations  No OT follow up    Equipment Recommendations  None recommended by OT       Precautions / Restrictions Precautions Precautions: Fall Restrictions Weight Bearing Restrictions: No      Mobility Bed Mobility Overal bed mobility: Independent                Transfers Overall transfer level: Needs assistance Equipment used: None Transfers: Sit to/from Stand Sit to Stand: Min guard         General transfer comment: He stated he felt a bit off balance upon first standing (sway noticed)--"first time I have really been up today (4:00pm)--encouarged pt to be up more with assistance and to sit up in recliner throughout day    Balance Overall balance assessment: Mild deficits observed, not formally tested(in standing)                                         ADL either performed or assessed with clinical judgement   ADL Overall ADL's : Needs assistance/impaired Eating/Feeding: Independent;Sitting   Grooming: Min guard;Standing   Upper Body Bathing: Set up;Sitting   Lower Body Bathing: Min guard;Sit to/from stand   Upper Body Dressing : Set up;Sitting   Lower Body Dressing: Min  guard;Sit to/from stand   Toilet Transfer: Min guard;Ambulation   Toileting- Clothing Manipulation and Hygiene: Min guard;Sit to/from stand               Vision Baseline Vision/History: Wears glasses Wears Glasses: At all times Patient Visual Report: No change from baseline(does report dizziness with position changes that dissipates) Additional Comments: Further assess vision--not issues functionally and pt reports that his sensitivity to light is better today than yesterday            Pertinent Vitals/Pain Pain Assessment: 0-10 Pain Score: 4  Pain Location: head ache (back and left side of head) Pain Descriptors / Indicators: Headache Pain Intervention(s): Limited activity within patient's tolerance;Monitored during session;Repositioned     Hand Dominance Right   Extremity/Trunk Assessment Upper Extremity Assessment Upper Extremity Assessment: Overall WFL for tasks assessed           Communication Communication Communication: No difficulties   Cognition Arousal/Alertness: Awake/alert Behavior During Therapy: WFL for tasks assessed/performed Overall Cognitive Status: Within Functional Limits for tasks assessed                                                Home Living Family/patient expects to be discharged to:: Private residence Living Arrangements:  Children;Other relatives(2 sons (14, 58) mother-in-law and father-in-law) Available Help at Discharge: Family Type of Home: House Home Access: Stairs to enter Technical brewer of Steps: 3   Home Layout: Multi-level Alternate Level Stairs-Number of Steps: 15 Alternate Level Stairs-Rails: Right;Left Bathroom Shower/Tub: Teacher, early years/pre: Standard Bathroom Accessibility: No   Home Equipment: None          Prior Functioning/Environment Level of Independence: Independent        Comments: Works as a Dealer , does not drive        OT Problem List: Impaired  balance (sitting and/or standing);Pain;Impaired vision/perception      OT Treatment/Interventions: Self-care/ADL training;Patient/family education;Balance training;Visual/perceptual remediation/compensation    OT Goals(Current goals can be found in the care plan section) Acute Rehab OT Goals Patient Stated Goal: to get back to work OT Goal Formulation: With patient Time For Goal Achievement: 10/24/19 Potential to Achieve Goals: Good  OT Frequency: Min 2X/week              AM-PAC OT "6 Clicks" Daily Activity     Outcome Measure Help from another person eating meals?: None Help from another person taking care of personal grooming?: A Little Help from another person toileting, which includes using toliet, bedpan, or urinal?: A Little Help from another person bathing (including washing, rinsing, drying)?: A Little Help from another person to put on and taking off regular upper body clothing?: A Little Help from another person to put on and taking off regular lower body clothing?: A Little 6 Click Score: 19   End of Session Equipment Utilized During Treatment: Gait belt Nurse Communication: Mobility status(RN saw pt ambulating in hallway with me)  Activity Tolerance: Patient tolerated treatment well Patient left: in chair;with call bell/phone within reach;with chair alarm set  OT Visit Diagnosis: Unsteadiness on feet (R26.81);Pain;Dizziness and giddiness (R42);Other symptoms and signs involving the nervous system (R29.898) Pain - part of body: (headache)                Time: 3664-4034 OT Time Calculation (min): 20 min Charges:  OT General Charges $OT Visit: 1 Visit OT Evaluation $OT Eval Moderate Complexity: 1 Mod  Cathy OTR/L Acute [employer] Pager [phone] Office [phone]     10/10/2019, 7:58 PM

## 2019-10-10 NOTE — Progress Notes (Signed)
PROGRESS NOTE    Andrew Hinton  TGY:[id number] DOB: [birth year] DOA: 10/08/2019 PCP: Patient, No Pcp Per     Brief Narrative:  Andrew Hinton is a 44 y.o. male with medical history significant for anxiety asthma depression. Patient was getting ready for work when he opened his basement steps, he was standing at the top of the basement and the next thing he knew, he was waking up at the bottom of the steps.  The last thing he remembers is being at the top of the stairs.  He woke up to his 14 year old son shouting and trying to shake him awake.  He thinks he might have been out for about 10 minutes. He reports he woke up this morning feeling a bit different.  When he was standing at the top of the stairs, he reports a lightheaded feeling, seeing spots like he was going to pass out. He also reports dizziness over the past several months, which worsens with positional change, moving his head. Head CT revealed acute subarachnoid hemorrhage in the right temporal and temporoparietal region, no associated mass-effect or midline shift.  Also nondisplaced fracture left temporal bone. Imaging included left shoulder and chest x-ray which were negative for acute abnormality.  CT abdomen and chest with contrast showed soft tissue hematoma overlying the left hip/gluteal lesion measuring 6.5 cm. Case discussed with neurosurgeon on-call; recommended admission overnight and repeat CT head and observation. Repeat head CT stable. Echo obtained as part of syncope work up and revealed new systolic dysfunction EF < 20%. Cardiology consulted.   New events last 24 hours / Subjective: Continues to have headache, which is relieved by IV morphine for a few hours before returning. Continues to have vertigo/dizziness symptoms.   Assessment & Plan:   Principal Problem:   Syncope Active Problems:   Subarachnoid hemorrhage (HCC)   AKI (acute kidney injury) (HCC)   Acute systolic CHF (congestive heart failure)  (HCC)   Syncope -Echocardiogram as below  -Carotid Doppler pending  -Will need Life[facility] at discharge  -Telemetry  -Add meclizine for vertigo   Subarachnoid hemorrhage -EDP discussed with neurosurgery on-call, Dr. Venetia Maxon, no recommendations for surgical intervention at this time.  Recommended observation and repeat head CT -CT head 2/25 with acute subarachnoid hemorrhage in the right temporal and temporoparietal region without mass-effect or midline shift.  No cervical spine fracture. -Repeat head CT 2/26 with no significant change in small volume subarachnoid hemorrhage in the right temporal lobe.  No midline shift or mass-effect.  No increase in hemorrhage compared to exam  New onset systolic heart failure, cardiomyopathy  -Echocardiogram revealed EF <20% with global hypokinesis  -Cardiology consulted; planning heart cath Monday  -Digoxin, losartan, spironolactone   AKI -Baseline creatinine ~1 -Improved overnight   Left temporal bone fracture, nondisplaced Left hip/gluteal hematoma -Left shoulder x-ray without fracture or dislocation -Chest x-ray without acute abnormality, CT chest without acute findings in the chest.  No acute osseous abnormality -CTA head and neck with nondisplaced fracture left temporal bone with overlying soft tissue swelling in the scalp -Secondary to fall.  Supportive care  Diarrhea -Now resolved.  Discontinued C. difficile test     DVT prophylaxis: SCD Code Status: Full code Family Communication: None at bedside Disposition Plan:   Patient is from home prior to admission.  Currently in-hospital treatment needed due to further work-up for new onset systolic heart failure. Heart cath planned 3/1   Suspect patient will discharge home in next week after cardiology work up complete.  Consultants:   Neurosurgery over the phone  Cardiology  Procedures:   None  Antimicrobials:  Anti-infectives (From admission, onward)   None        Objective: Vitals:   10/09/19 2118 10/09/19 2350 10/10/19 0315 10/10/19 0756  BP:  114/79 127/90 123/88  Pulse:  71 76 67  Resp:  18 18 16   Temp:  98 F (36.7 C) 97.7 F (36.5 C) 97.9 F (36.6 C)  TempSrc:  Oral Oral Oral  SpO2:  98% 98% 99%  Weight: 88.6 kg     Height:        Intake/Output Summary (Last 24 hours) at 10/10/2019 0912 Last data filed at 10/10/2019 0542 Gross per 24 hour  Intake 612.15 ml  Output 750 ml  Net -137.85 ml   Filed Weights   10/08/19 0811 10/09/19 2118  Weight: 93 kg 88.6 kg    Examination: General exam: Appears calm and comfortable  Respiratory system: Clear to auscultation. Respiratory effort normal. Cardiovascular system: S1 & S2 heard, RRR. No pedal edema. Gastrointestinal system: Abdomen is nondistended, soft and nontender. Normal bowel sounds heard. Central nervous system: Alert and oriented. Non focal exam. Speech clear  Extremities: Symmetric in appearance bilaterally  Skin: No rashes, lesions or ulcers on exposed skin  Psychiatry: Judgement and insight appear stable. Mood & affect appropriate.     Data Reviewed: I have personally reviewed following labs and imaging studies  CBC: Recent Labs  Lab 10/08/19 0908 10/09/19 0311  WBC 7.6 8.6  NEUTROABS 5.4  --   HGB 14.0 13.6  HCT 42.3 41.0  MCV 96.4 96.7  PLT 332 329   Basic Metabolic Panel: Recent Labs  Lab 10/08/19 0908 10/08/19 1537 10/09/19 0311 10/10/19 0257  NA 141  --  138 137  K 4.1  --  4.0 4.3  CL 108  --  101 101  CO2 23  --  27 27  GLUCOSE 102*  --  97 111*  BUN 18  --  18 16  CREATININE 0.94  --  1.46* 1.32*  CALCIUM 8.8*  --  8.9 9.1  MG  --  2.2  --   --    GFR: Estimated Creatinine Clearance: 80 mL/min (A) (by C-G formula based on SCr of 1.32 mg/dL (H)). Liver Function Tests: Recent Labs  Lab 10/08/19 0908 10/09/19 0311  AST 50* 29  ALT 86* 65*  ALKPHOS 51 52  BILITOT 0.6 0.8  PROT 7.5 6.5  ALBUMIN 4.1 3.5   No results for  input(s): LIPASE, AMYLASE in the last 168 hours. No results for input(s): AMMONIA in the last 168 hours. Coagulation Profile: No results for input(s): INR, PROTIME in the last 168 hours. Cardiac Enzymes: No results for input(s): CKTOTAL, CKMB, CKMBINDEX, TROPONINI in the last 168 hours. BNP (last 3 results) No results for input(s): PROBNP in the last 8760 hours. HbA1C: No results for input(s): HGBA1C in the last 72 hours. CBG: No results for input(s): GLUCAP in the last 168 hours. Lipid Profile: No results for input(s): CHOL, HDL, LDLCALC, TRIG, CHOLHDL, LDLDIRECT in the last 72 hours. Thyroid Function Tests: Recent Labs    10/08/19 1537  TSH 0.778   Anemia Panel: No results for input(s): VITAMINB12, FOLATE, FERRITIN, TIBC, IRON, RETICCTPCT in the last 72 hours. Sepsis Labs: No results for input(s): PROCALCITON, LATICACIDVEN in the last 168 hours.  Recent Results (from the past 240 hour(s))  Respiratory Panel by RT PCR (Flu A&B, Covid) - Nasopharyngeal Swab  Status: None   Collection Time: 10/08/19 12:28 PM   Specimen: Nasopharyngeal Swab  Result Value Ref Range Status   SARS Coronavirus 2 by RT PCR NEGATIVE NEGATIVE Final    Comment: (NOTE) SARS-CoV-2 target nucleic acids are NOT DETECTED. The SARS-CoV-2 RNA is generally detectable in upper respiratoy specimens during the acute phase of infection. The lowest concentration of SARS-CoV-2 viral copies this assay can detect is 131 copies/mL. A negative result does not preclude SARS-Cov-2 infection and should not be used as the sole basis for treatment or other patient management decisions. A negative result may occur with  improper specimen collection/handling, submission of specimen other than nasopharyngeal swab, presence of viral mutation(s) within the areas targeted by this assay, and inadequate number of viral copies (<131 copies/mL). A negative result must be combined with clinical observations, patient history, and  epidemiological information. The expected result is Negative. Fact Sheet for Patients:  https://www.moore.com/ Fact Sheet for Healthcare Providers:  https://www.young.biz/ This test is not yet ap proved or cleared by the [place] FDA and  has been authorized for detection and/or diagnosis of SARS-CoV-2 by FDA under an Emergency Use Authorization (EUA). This EUA will remain  in effect (meaning this test can be used) for the duration of the COVID-19 declaration under Section 564(b)(1) of the Act, 21 U.S.C. section 360bbb-3(b)(1), unless the authorization is terminated or revoked sooner.    Influenza A by PCR NEGATIVE NEGATIVE Final   Influenza B by PCR NEGATIVE NEGATIVE Final    Comment: (NOTE) The Xpert Xpress SARS-CoV-2/FLU/RSV assay is intended as an aid in  the diagnosis of influenza from Nasopharyngeal swab specimens and  should not be used as a sole basis for treatment. Nasal washings and  aspirates are unacceptable for Xpert Xpress SARS-CoV-2/FLU/RSV  testing. Fact Sheet for Patients: https://www.moore.com/ Fact Sheet for Healthcare Providers: https://www.young.biz/ This test is not yet approved or cleared by the [place] FDA and  has been authorized for detection and/or diagnosis of SARS-CoV-2 by  FDA under an Emergency Use Authorization (EUA). This EUA will remain  in effect (meaning this test can be used) for the duration of the  Covid-19 declaration under Section 564(b)(1) of the Act, 21  U.S.C. section 360bbb-3(b)(1), unless the authorization is  terminated or revoked. Performed at [facility], [address], [place], [place] [address]       Radiology Studies: CT ANGIO HEAD W OR WO CONTRAST  Result Date: 10/08/2019 CLINICAL DATA:  Larey Seat down steps.  Subarachnoid hemorrhage. EXAM: CT ANGIOGRAPHY HEAD AND NECK TECHNIQUE: Multidetector CT imaging of the head and neck was performed  using the standard protocol during bolus administration of intravenous contrast. Multiplanar CT image reconstructions and MIPs were obtained to evaluate the vascular anatomy. Carotid stenosis measurements (when applicable) are obtained utilizing NASCET criteria, using the distal internal carotid diameter as the denominator. CONTRAST:  75mL OMNIPAQUE IOHEXOL 350 MG/ML SOLN COMPARISON:  CT head earlier today FINDINGS: CT HEAD FINDINGS Brain: Small volume subarachnoid hemorrhage on the right is stable. No subdural hemorrhage. No midline shift. Basilar cisterns and suprasellar cistern negative for hemorrhage. Ventricle size normal. Negative for acute infarct or mass. Vascular: Negative for hyperdense vessel Skull: Nondepressed fracture left temporal bone squamosal portion, unchanged from the prior study. Overlying soft tissue swelling in the left parietal scalp. Sinuses: Extensive mucosal edema throughout the paranasal sinuses unchanged. Negative orbit. Orbits: Negative orbit Review of the MIP images confirms the above findings NECK FINDINGS Aortic arch: Normal aortic arch. Bovine branching pattern.  Proximal great vessels normal. Right carotid system: Normal right carotid. Negative for atherosclerotic disease or dissection. Left carotid system: Normal left carotid. Negative for atherosclerotic disease or dissection Vertebral arteries: Both vertebral arteries are normal and widely patent to the basilar Skeleton: Negative for cervical spine fracture. Poor dentition with multiple caries Other neck: Negative for mass or soft tissue swelling in the neck. Upper chest: Lung apices clear bilaterally. Review of the MIP images confirms the above findings CTA HEAD FINDINGS Anterior circulation: Cavernous carotid widely patent and normal bilaterally. No stenosis or atherosclerotic disease. Negative for aneurysm. Anterior and middle cerebral arteries normal bilaterally without stenosis or aneurysm. Posterior circulation: Both  vertebral arteries patent to the basilar. PICA patent bilaterally. Basilar widely patent. Superior cerebellar and posterior cerebral arteries widely patent bilaterally. Negative for aneurysm. Venous sinuses: Normal venous enhancement. Anatomic variants: None Review of the MIP images confirms the above findings IMPRESSION: 1. Small volume subarachnoid hemorrhage on the right is stable. No new hemorrhage identified. 2. Nondisplaced fracture left temporal bone with overlying soft tissue swelling in the scalp. Findings suggest significant head trauma with contrecoup subarachnoid hemorrhage on the right. 3. Negative for cerebral aneurysm 4. No significant intracranial or extracranial arterial stenosis. 5. These results were called by telephone at the time of interpretation on 10/08/2019 at 1:40 pm to provider JOSEPH ZAMMIT , who verbally acknowledged these results. Electronically Signed   By: Franchot Gallo M.D.   On: 10/08/2019 13:42   DG Chest 1 View  Result Date: 10/08/2019 CLINICAL DATA:  Fall EXAM: CHEST  1 VIEW COMPARISON:  03/27/2018 FINDINGS: The heart size and mediastinal contours are within normal limits. Both lungs are clear. The visualized skeletal structures are unremarkable. IMPRESSION: No acute abnormality of the lungs in AP portable projection. Electronically Signed   By: Eddie Candle M.D.   On: 10/08/2019 10:13   CT HEAD WO CONTRAST  Result Date: 10/09/2019 CLINICAL DATA:  Intracranial hemorrhage. Trauma. EXAM: CT HEAD WITHOUT CONTRAST TECHNIQUE: Contiguous axial images were obtained from the base of the skull through the vertex without intravenous contrast. COMPARISON:  CT 10/08/2019 FINDINGS: Brain: Again demonstrated small volume subarachnoid hemorrhage in the RIGHT temporal lobe. No increased hemorrhage compared to exam 1 day prior. No midline shift or mass effect. No intraventricular hemorrhage. Vascular: No hyperdense vessel or unexpected calcification. Skull: Subtle nondisplaced temporal  bone fracture (squamous portion) on the LEFT again noted (image 38/4 and image 22/4). Associated scalp hematoma over the LEFT parietal bone stable thickness at 6 mm. Sinuses/Orbits: Dense opacification of the paranasal sinuses consistent chronic sinusitis. Orbits normal Other: IMPRESSION: 1. No significant change in small volume subarachnoid hemorrhage in the RIGHT temporal lobe. No midline shift or mass effect. 2. No increased hemorrhage compared to exam 1 day prior. 3. Stable nondisplaced LEFT temporal bone fracture. Electronically Signed   By: Suzy Bouchard M.D.   On: 10/09/2019 09:13   CT Head Wo Contrast  Result Date: 10/08/2019 CLINICAL DATA:  [place] down steps. Loss of consciousness. EXAM: CT HEAD WITHOUT CONTRAST CT CERVICAL SPINE WITHOUT CONTRAST TECHNIQUE: Multidetector CT imaging of the head and cervical spine was performed following the standard protocol without intravenous contrast. Multiplanar CT image reconstructions of the cervical spine were also generated. COMPARISON:  None. FINDINGS: CT HEAD FINDINGS Brain: Acute subarachnoid hemorrhage is seen in the right temporal lobe and temporoparietal region, well demonstrated on axial images 13, 14 of series 2 and coronal images 36 through 39 of series 4. No midline shift. No hydrocephalus.  No mass lesion evident. Vascular: No hyperdense vessel or unexpected calcification. Skull: No evidence for fracture. No worrisome lytic or sclerotic lesion. Chronic deformity noted in the mandibular condyles bilaterally with bilateral TMJ subluxation. Sinuses/Orbits: Near complete opacification noted in the bilateral frontal sinuses, bilateral ethmoid air cells, and right maxillary sinus. Extensive chronic mucosal disease noted in the left maxillary sinus. Mastoid air cells are clear bilaterally. Other: None. CT CERVICAL SPINE FINDINGS Alignment: Normal. Skull base and vertebrae: No acute fracture. No primary bone lesion or focal pathologic process. Soft tissues and  spinal canal: No prevertebral fluid or swelling. No visible canal hematoma. Disc levels:  Preserved throughout. Upper chest: Unremarkable. Other: None. IMPRESSION: 1. Acute subarachnoid hemorrhage identified in the right temporal and temporoparietal region. No associated mass effect or midline shift. 2. No cervical spine fracture. 3. Chronic paranasal sinusitis. 4. Chronic deformity of the mandibular condyles bilaterally, potentially posttraumatic or degenerative with subluxation of the bilateral temporomandibular joints. Critical Value/emergent results were called by telephone at the time of interpretation on 10/08/2019 at 10:52 am to provider [facility] ZAMMIT , who verbally acknowledged these results. Electronically Signed   By: Kennith Center M.D.   On: 10/08/2019 10:53   CT Angio Neck W and/or Wo Contrast  Result Date: 10/08/2019 CLINICAL DATA:  Larey Seat down steps.  Subarachnoid hemorrhage. EXAM: CT ANGIOGRAPHY HEAD AND NECK TECHNIQUE: Multidetector CT imaging of the head and neck was performed using the standard protocol during bolus administration of intravenous contrast. Multiplanar CT image reconstructions and MIPs were obtained to evaluate the vascular anatomy. Carotid stenosis measurements (when applicable) are obtained utilizing NASCET criteria, using the distal internal carotid diameter as the denominator. CONTRAST:  12mL OMNIPAQUE IOHEXOL 350 MG/ML SOLN COMPARISON:  CT head earlier today FINDINGS: CT HEAD FINDINGS Brain: Small volume subarachnoid hemorrhage on the right is stable. No subdural hemorrhage. No midline shift. Basilar cisterns and suprasellar cistern negative for hemorrhage. Ventricle size normal. Negative for acute infarct or mass. Vascular: Negative for hyperdense vessel Skull: Nondepressed fracture left temporal bone squamosal portion, unchanged from the prior study. Overlying soft tissue swelling in the left parietal scalp. Sinuses: Extensive mucosal edema throughout the paranasal sinuses  unchanged. Negative orbit. Orbits: Negative orbit Review of the MIP images confirms the above findings NECK FINDINGS Aortic arch: Normal aortic arch. Bovine branching pattern. Proximal great vessels normal. Right carotid system: Normal right carotid. Negative for atherosclerotic disease or dissection. Left carotid system: Normal left carotid. Negative for atherosclerotic disease or dissection Vertebral arteries: Both vertebral arteries are normal and widely patent to the basilar Skeleton: Negative for cervical spine fracture. Poor dentition with multiple caries Other neck: Negative for mass or soft tissue swelling in the neck. Upper chest: Lung apices clear bilaterally. Review of the MIP images confirms the above findings CTA HEAD FINDINGS Anterior circulation: Cavernous carotid widely patent and normal bilaterally. No stenosis or atherosclerotic disease. Negative for aneurysm. Anterior and middle cerebral arteries normal bilaterally without stenosis or aneurysm. Posterior circulation: Both vertebral arteries patent to the basilar. PICA patent bilaterally. Basilar widely patent. Superior cerebellar and posterior cerebral arteries widely patent bilaterally. Negative for aneurysm. Venous sinuses: Normal venous enhancement. Anatomic variants: None Review of the MIP images confirms the above findings IMPRESSION: 1. Small volume subarachnoid hemorrhage on the right is stable. No new hemorrhage identified. 2. Nondisplaced fracture left temporal bone with overlying soft tissue swelling in the scalp. Findings suggest significant head trauma with contrecoup subarachnoid hemorrhage on the right. 3. Negative for cerebral aneurysm 4. No  significant intracranial or extracranial arterial stenosis. 5. These results were called by telephone at the time of interpretation on 10/08/2019 at 1:40 pm to provider JOSEPH ZAMMIT , who verbally acknowledged these results. Electronically Signed   By: Marlan Palau M.D.   On: 10/08/2019 13:42    CT Chest W Contrast  Result Date: 10/08/2019 CLINICAL DATA:  Fall, left-sided rib pain. History of appendectomy EXAM: CT CHEST, ABDOMEN, AND PELVIS WITH CONTRAST TECHNIQUE: Multidetector CT imaging of the chest, abdomen and pelvis was performed following the standard protocol during bolus administration of intravenous contrast. CONTRAST:  OMNIPAQUE IOHEXOL 300 MG/ML  SOLN COMPARISON:  CT chest 09/21/2017, CT abdomen-pelvis 08/25/2019 FINDINGS: CT CHEST FINDINGS Cardiovascular: Normal heart size. No pericardial effusion. Thoracic aorta is normal in course and caliber. Pulmonary trunk is nondilated. No significant vascular findings identified. Mediastinum/Nodes: No enlarged mediastinal, hilar, or axillary lymph nodes. Thyroid gland, trachea, and esophagus demonstrate no significant findings. Lungs/Pleura: Lungs are clear. No pleural effusion or pneumothorax. Musculoskeletal: Healed fractures of the posterior right eighth, ninth, and tenth ribs. Normal spinal alignment. Vertebral body heights are maintained. No acute osseous findings. No chest wall hematoma or other abnormality. CT ABDOMEN PELVIS FINDINGS Hepatobiliary: Diffusely decreased attenuation of the hepatic parenchyma compatible with hepatic steatosis. No focal hepatic injury or pericapsular collection. Gallbladder unremarkable. No gallstone or biliary dilatation. Pancreas: Unremarkable. No pancreatic ductal dilatation or surrounding inflammatory changes. Spleen: No splenic injury or perisplenic hematoma. Adrenals/Urinary Tract: No adrenal hemorrhage or renal injury identified. Bladder is unremarkable. Stomach/Bowel: Stomach is within normal limits. Appendix surgically absent. Scattered colonic diverticulosis. No evidence of bowel wall thickening, distention, or inflammatory changes. Vascular/Lymphatic: No significant vascular findings are present. No enlarged abdominal or pelvic lymph nodes. Reproductive: Prostate is unremarkable. Other: Small fat  containing umbilical hernia. No abdominopelvic ascites. Musculoskeletal: Induration within the subcutaneous tissues overlying the left hip and gluteal region with focal hyperdense fluid collection measuring 5.6 x 1.7 x 6.5 cm (volume = 32 cm^3) suggestive of a hematoma (series 2, image 117). Pelvic bony ring is intact without fracture. Bilateral hip joints intact without fracture or dislocation. Lumbar vertebral body heights are maintained without fracture or static listhesis. Intervertebral disc height loss of L5-S1. IMPRESSION: 1. Soft tissue hematoma overlying the left hip/gluteal region measuring up to 6.5 cm. 2. No acute osseous abnormality. 3. No acute findings within the chest. No solid organ or visceral injury within the abdomen or pelvis. 4. Hepatic steatosis. Electronically Signed   By: Duanne Guess D.O.   On: 10/08/2019 10:59   CT Cervical Spine Wo Contrast  Result Date: 10/08/2019 CLINICAL DATA:  Larey Seat down steps. Loss of consciousness. EXAM: CT HEAD WITHOUT CONTRAST CT CERVICAL SPINE WITHOUT CONTRAST TECHNIQUE: Multidetector CT imaging of the head and cervical spine was performed following the standard protocol without intravenous contrast. Multiplanar CT image reconstructions of the cervical spine were also generated. COMPARISON:  None. FINDINGS: CT HEAD FINDINGS Brain: Acute subarachnoid hemorrhage is seen in the right temporal lobe and temporoparietal region, well demonstrated on axial images 13, 14 of series 2 and coronal images 36 through 39 of series 4. No midline shift. No hydrocephalus. No mass lesion evident. Vascular: No hyperdense vessel or unexpected calcification. Skull: No evidence for fracture. No worrisome lytic or sclerotic lesion. Chronic deformity noted in the mandibular condyles bilaterally with bilateral TMJ subluxation. Sinuses/Orbits: Near complete opacification noted in the bilateral frontal sinuses, bilateral ethmoid air cells, and right maxillary sinus. Extensive chronic  mucosal disease noted in the left  maxillary sinus. Mastoid air cells are clear bilaterally. Other: None. CT CERVICAL SPINE FINDINGS Alignment: Normal. Skull base and vertebrae: No acute fracture. No primary bone lesion or focal pathologic process. Soft tissues and spinal canal: No prevertebral fluid or swelling. No visible canal hematoma. Disc levels:  Preserved throughout. Upper chest: Unremarkable. Other: None. IMPRESSION: 1. Acute subarachnoid hemorrhage identified in the right temporal and temporoparietal region. No associated mass effect or midline shift. 2. No cervical spine fracture. 3. Chronic paranasal sinusitis. 4. Chronic deformity of the mandibular condyles bilaterally, potentially posttraumatic or degenerative with subluxation of the bilateral temporomandibular joints. Critical Value/emergent results were called by telephone at the time of interpretation on 10/08/2019 at 10:52 am to provider [facility] [employer] ZAMMIT , who verbally acknowledged these results. Electronically Signed   By: Kennith Center M.D.   On: 10/08/2019 10:53   CT ABDOMEN PELVIS W CONTRAST  Result Date: 10/08/2019 CLINICAL DATA:  Fall, left-sided rib pain. History of appendectomy EXAM: CT CHEST, ABDOMEN, AND PELVIS WITH CONTRAST TECHNIQUE: Multidetector CT imaging of the chest, abdomen and pelvis was performed following the standard protocol during bolus administration of intravenous contrast. CONTRAST:  OMNIPAQUE IOHEXOL 300 MG/ML  SOLN COMPARISON:  CT chest 09/21/2017, CT abdomen-pelvis 08/25/2019 FINDINGS: CT CHEST FINDINGS Cardiovascular: Normal heart size. No pericardial effusion. Thoracic aorta is normal in course and caliber. Pulmonary trunk is nondilated. No significant vascular findings identified. Mediastinum/Nodes: No enlarged mediastinal, hilar, or axillary lymph nodes. Thyroid gland, trachea, and esophagus demonstrate no significant findings. Lungs/Pleura: Lungs are clear. No pleural effusion or pneumothorax.  Musculoskeletal: Healed fractures of the posterior right eighth, ninth, and tenth ribs. Normal spinal alignment. Vertebral body heights are maintained. No acute osseous findings. No chest wall hematoma or other abnormality. CT ABDOMEN PELVIS FINDINGS Hepatobiliary: Diffusely decreased attenuation of the hepatic parenchyma compatible with hepatic steatosis. No focal hepatic injury or pericapsular collection. Gallbladder unremarkable. No gallstone or biliary dilatation. Pancreas: Unremarkable. No pancreatic ductal dilatation or surrounding inflammatory changes. Spleen: No splenic injury or perisplenic hematoma. Adrenals/Urinary Tract: No adrenal hemorrhage or renal injury identified. Bladder is unremarkable. Stomach/Bowel: Stomach is within normal limits. Appendix surgically absent. Scattered colonic diverticulosis. No evidence of bowel wall thickening, distention, or inflammatory changes. Vascular/Lymphatic: No significant vascular findings are present. No enlarged abdominal or pelvic lymph nodes. Reproductive: Prostate is unremarkable. Other: Small fat containing umbilical hernia. No abdominopelvic ascites. Musculoskeletal: Induration within the subcutaneous tissues overlying the left hip and gluteal region with focal hyperdense fluid collection measuring 5.6 x 1.7 x 6.5 cm (volume = 32 cm^3) suggestive of a hematoma (series 2, image 117). Pelvic bony ring is intact without fracture. Bilateral hip joints intact without fracture or dislocation. Lumbar vertebral body heights are maintained without fracture or static listhesis. Intervertebral disc height loss of L5-S1. IMPRESSION: 1. Soft tissue hematoma overlying the left hip/gluteal region measuring up to 6.5 cm. 2. No acute osseous abnormality. 3. No acute findings within the chest. No solid organ or visceral injury within the abdomen or pelvis. 4. Hepatic steatosis. Electronically Signed   By: Duanne Guess D.O.   On: 10/08/2019 10:59   DG Shoulder  Left  Result Date: 10/08/2019 CLINICAL DATA:  Fall, pain EXAM: LEFT SHOULDER - 2+ VIEW COMPARISON:  None. FINDINGS: There is no evidence of fracture or dislocation. There is no evidence of arthropathy or other focal bone abnormality. Soft tissues are unremarkable. IMPRESSION: No fracture or dislocation of the left shoulder. Joint spaces are preserved. Electronically Signed   By: Lauralyn Primes  M.D.   On: 10/08/2019 10:11   ECHOCARDIOGRAM COMPLETE  Result Date: 10/09/2019    ECHOCARDIOGRAM REPORT   Patient Name:   Andrew Hinton Date of Exam: 10/09/2019 Medical Rec #:  [id number]          Height:       72.0 in Accession #:    [id number]         Weight:       205.0 lb Date of Birth:  January 26, 1976          BSA:          2.153 m Patient Age:    43 years           BP:           119/85 mmHg Patient Gender: M                  HR:           76 bpm. Exam Location:  Inpatient Procedure: 2D Echo and Intracardiac Opacification Agent Indications:    Syncope 780.2 / R55  History:        Patient has no prior history of Echocardiogram examinations. No                 prior cardiac history.  Sonographer:    Leeroy Bock Turrentine Referring Phys: [id number] Heloise Beecham EMOKPAE IMPRESSIONS  1. Left ventricular ejection fraction, by estimation, is <20%. The left ventricle has severely decreased function. The left ventricle demonstrates global hypokinesis. The left ventricular internal cavity size was severely dilated. Left ventricular diastolic parameters were normal.  2. Right ventricular systolic function is normal. The right ventricular size is normal.  3. The mitral valve is normal in structure and function. Trivial mitral valve regurgitation. No evidence of mitral stenosis.  4. The aortic valve is normal in structure and function. Aortic valve regurgitation is not visualized. No aortic stenosis is present.  5. The inferior vena cava is normal in size with greater than 50% respiratory variability, suggesting right atrial pressure of 3  mmHg. FINDINGS  Left Ventricle: Left ventricular ejection fraction, by estimation, is <20%. The left ventricle has severely decreased function. The left ventricle demonstrates global hypokinesis. Definity contrast agent was given IV to delineate the left ventricular endocardial borders. The left ventricular internal cavity size was severely dilated. There is no left ventricular hypertrophy. Left ventricular diastolic parameters were normal. Right Ventricle: The right ventricular size is normal. No increase in right ventricular wall thickness. Right ventricular systolic function is normal. Left Atrium: Left atrial size was normal in size. Right Atrium: Right atrial size was normal in size. Pericardium: There is no evidence of pericardial effusion. Mitral Valve: The mitral valve is normal in structure and function. Normal mobility of the mitral valve leaflets. Trivial mitral valve regurgitation. No evidence of mitral valve stenosis. Tricuspid Valve: The tricuspid valve is normal in structure. Tricuspid valve regurgitation is trivial. No evidence of tricuspid stenosis. Aortic Valve: The aortic valve is normal in structure and function. Aortic valve regurgitation is not visualized. No aortic stenosis is present. Pulmonic Valve: The pulmonic valve was normal in structure. Pulmonic valve regurgitation is not visualized. No evidence of pulmonic stenosis. Aorta: The aortic root is normal in size and structure. Venous: The inferior vena cava is normal in size with greater than 50% respiratory variability, suggesting right atrial pressure of 3 mmHg. IAS/Shunts: No atrial level shunt detected by color flow Doppler.  LEFT VENTRICLE PLAX 2D LVIDd:  6.00 cm  Diastology LVIDs:         5.20 cm  LV e' lateral:   10.90 cm/s LV PW:         0.80 cm  LV E/e' lateral: 6.4 LV IVS:        0.80 cm  LV e' medial:    5.11 cm/s LVOT diam:     2.30 cm  LV E/e' medial:  13.7 LV SV:         52 LV SV Index:   24 LVOT Area:     4.15 cm   RIGHT VENTRICLE RV S prime:     12.60 cm/s TAPSE (M-mode): 1.9 cm LEFT ATRIUM             Index       RIGHT ATRIUM           Index LA diam:        3.70 cm 1.72 cm/m  RA Area:     13.90 cm LA Vol (A2C):   66.2 ml 30.75 ml/m RA Volume:   37.60 ml  17.46 ml/m LA Vol (A4C):   59.6 ml 27.68 ml/m LA Biplane Vol: 69.6 ml 32.33 ml/m  AORTIC VALVE LVOT Vmax:   70.70 cm/s LVOT Vmean:  51.400 cm/s LVOT VTI:    0.126 m  AORTA Ao Root diam: 2.80 cm MITRAL VALVE MV Area (PHT): 4.60 cm    SHUNTS MV Decel Time: 165 msec    Systemic VTI:  0.13 m MV E velocity: 69.80 cm/s  Systemic Diam: 2.30 cm MV A velocity: 48.70 cm/s MV E/A ratio:  1.43 Charlton Haws MD Electronically signed by Charlton Haws MD Signature Date/Time: 10/09/2019/9:22:13 AM    Final       Scheduled Meds:  digoxin  0.125 mg Oral Daily   losartan  12.5 mg Oral Daily   sodium chloride flush  3 mL Intravenous Q12H   spironolactone  12.5 mg Oral Daily   Continuous Infusions:  sodium chloride     sodium chloride 10 mL/hr at 10/10/19 0520     LOS: 2 days      Time spent: 35 minutes   Noralee Stain, DO [facility] 10/10/2019, 9:12 AM   Available via Epic secure chat 7am-7pm After these hours, please refer to coverage provider listed on [email or web address]

## 2019-10-11 LAB — BASIC METABOLIC PANEL
Anion gap: 10 (ref 5–15)
BUN: 15 mg/dL (ref 6–20)
CO2: 24 mmol/L (ref 22–32)
Calcium: 9.1 mg/dL (ref 8.9–10.3)
Chloride: 100 mmol/L (ref 98–111)
Creatinine, Ser: 1.16 mg/dL (ref 0.61–1.24)
GFR calc Af Amer: >60 mL/min (ref >60)
GFR calc non Af Amer: >60 mL/min (ref >60)
Glucose, Bld: 106 mg/dL — ABNORMAL HIGH (ref 70–99)
Potassium: 4.1 mmol/L (ref 3.5–5.1)
Sodium: 134 mmol/L — ABNORMAL LOW (ref 135–145)

## 2019-10-11 MED ORDER — CARVEDILOL 3.125 MG PO TABS
3.1250 mg | ORAL_TABLET | Freq: Two times a day (BID) | ORAL | Status: DC
  Administered 2019-10-11 – 2019-10-14 (×6): 3.125 mg via ORAL
  Filled 2019-10-11 (×6): qty 1

## 2019-10-11 MED ORDER — SPIRONOLACTONE 25 MG PO TABS
25.0000 mg | ORAL_TABLET | Freq: Every day | ORAL | Status: DC
  Administered 2019-10-11 – 2019-10-14 (×4): 25 mg via ORAL
  Filled 2019-10-11 (×4): qty 1

## 2019-10-11 MED ORDER — LOSARTAN POTASSIUM 25 MG PO TABS
12.5000 mg | ORAL_TABLET | Freq: Two times a day (BID) | ORAL | Status: DC
  Administered 2019-10-11 – 2019-10-14 (×7): 12.5 mg via ORAL
  Filled 2019-10-11 (×7): qty 1

## 2019-10-11 NOTE — Progress Notes (Signed)
PROGRESS NOTE    Andrew Hinton  WOE:[id number] DOB: 08-03-1976 DOA: 10/08/2019 PCP: Patient, No Pcp Per     Brief Narrative:  Andrew Hinton is a 44 y.o. male with medical history significant for anxiety asthma depression. Patient was getting ready for work when he opened his basement steps, he was standing at the top of the basement and the next thing he knew, he was waking up at the bottom of the steps.  The last thing he remembers is being at the top of the stairs.  He woke up to his 79 year old son shouting and trying to shake him awake.  He thinks he might have been out for about 10 minutes. He reports he woke up this morning feeling a bit different.  When he was standing at the top of the stairs, he reports a lightheaded feeling, seeing spots like he was going to pass out. He also reports dizziness over the past several months, which worsens with positional change, moving his head. Head CT revealed acute subarachnoid hemorrhage in the right temporal and temporoparietal region, no associated mass-effect or midline shift.  Also nondisplaced fracture left temporal bone. Imaging included left shoulder and chest x-ray which were negative for acute abnormality.  CT abdomen and chest with contrast showed soft tissue hematoma overlying the left hip/gluteal lesion measuring 6.5 cm. Case discussed with neurosurgeon on-call; recommended admission overnight and repeat CT head and observation. Repeat head CT stable. Echo obtained as part of syncope work up and revealed new systolic dysfunction EF < 20%. Cardiology consulted.   New events last 24 hours / Subjective: Headache improved with addition of oxycodone yesterday.  Continues to have vertigo/dizziness but has not tried meclizine that was ordered as needed.  No chest pain.  Assessment & Plan:   Principal Problem:   Syncope Active Problems:   Subarachnoid hemorrhage (HCC)   AKI (acute kidney injury) (HCC)   Acute systolic CHF (congestive  heart failure) (HCC)   Syncope -Echocardiogram as below  -Will need Lifevest at discharge  -Telemetry  -Meclizine as needed for vertigo   Subarachnoid hemorrhage -EDP discussed with neurosurgery on-call, Dr. Venetia Maxon, no recommendations for surgical intervention at this time.  Recommended observation and repeat head CT -CT head 2/25 with acute subarachnoid hemorrhage in the right temporal and temporoparietal region without mass-effect or midline shift.  No cervical spine fracture. -Repeat head CT 2/26 with no significant change in small volume subarachnoid hemorrhage in the right temporal lobe.  No midline shift or mass-effect.  No increase in hemorrhage compared to exam  New onset systolic heart failure, cardiomyopathy  -Echocardiogram revealed EF <20% with global hypokinesis  -Cardiology consulted; planning heart cath Monday  -Digoxin, losartan, spironolactone, Coreg  AKI -Baseline creatinine ~1 -Resolved, creatinine 1.16 today  Left temporal bone fracture, nondisplaced Left hip/gluteal hematoma -Left shoulder x-ray without fracture or dislocation -Chest x-ray without acute abnormality, CT chest without acute findings in the chest.  No acute osseous abnormality -CTA head and neck with nondisplaced fracture left temporal bone with overlying soft tissue swelling in the scalp -Secondary to fall.  Supportive care  Diarrhea -Now resolved.  Discontinued C. difficile test     DVT prophylaxis: SCD Code Status: Full code Family Communication: None at bedside Disposition Plan:  . Patient is from home prior to admission. . Currently in-hospital treatment needed due to further work-up for new onset systolic heart failure. Heart cath planned 3/1  . Suspect patient will discharge home after cardiology work up complete.  Consultants:   Neurosurgery over the phone  Cardiology  Procedures:   None  Antimicrobials:  Anti-infectives (From admission, onward)   None        Objective: Vitals:   10/10/19 2317 10/11/19 0445 10/11/19 0801 10/11/19 0937  BP: 122/79 110/87 118/83   Pulse: 83 74 (!) 57 78  Resp: 18 18 16    Temp: 98.1 F (36.7 C) 97.6 F (36.4 C) 97.6 F (36.4 C)   TempSrc: Oral Oral Oral   SpO2: 99% 100% 99%   Weight:      Height:        Intake/Output Summary (Last 24 hours) at 10/11/2019 1003 Last data filed at 10/11/2019 0449 Gross per 24 hour  Intake 56.78 ml  Output 1875 ml  Net -1818.22 ml   Filed Weights   10/08/19 0811 10/09/19 2118  Weight: 93 kg 88.6 kg    Examination: General exam: Appears calm and comfortable  Respiratory system: Clear to auscultation. Respiratory effort normal. Cardiovascular system: S1 & S2 heard, RRR. No pedal edema. Gastrointestinal system: Abdomen is nondistended, soft and nontender. Normal bowel sounds heard. Central nervous system: Alert and oriented. Non focal exam. Speech clear  Extremities: Symmetric in appearance bilaterally  Skin: No rashes, lesions or ulcers on exposed skin  Psychiatry: Judgement and insight appear stable. Mood & affect appropriate.    Data Reviewed: I have personally reviewed following labs and imaging studies  CBC: Recent Labs  Lab 10/08/19 0908 10/09/19 0311  WBC 7.6 8.6  NEUTROABS 5.4  --   HGB 14.0 13.6  HCT 42.3 41.0  MCV 96.4 96.7  PLT 332 660   Basic Metabolic Panel: Recent Labs  Lab 10/08/19 0908 10/08/19 1537 10/09/19 0311 10/10/19 0257 10/11/19 0523  NA 141  --  138 137 134*  K 4.1  --  4.0 4.3 4.1  CL 108  --  101 101 100  CO2 23  --  27 27 24   GLUCOSE 102*  --  97 111* 106*  BUN 18  --  18 16 15   CREATININE 0.94  --  1.46* 1.32* 1.16  CALCIUM 8.8*  --  8.9 9.1 9.1  MG  --  2.2  --   --   --    GFR: Estimated Creatinine Clearance: 91.1 mL/min (by C-G formula based on SCr of 1.16 mg/dL). Liver Function Tests: Recent Labs  Lab 10/08/19 0908 10/09/19 0311  AST 50* 29  ALT 86* 65*  ALKPHOS 51 52  BILITOT 0.6 0.8  PROT 7.5  6.5  ALBUMIN 4.1 3.5   No results for input(s): LIPASE, AMYLASE in the last 168 hours. No results for input(s): AMMONIA in the last 168 hours. Coagulation Profile: No results for input(s): INR, PROTIME in the last 168 hours. Cardiac Enzymes: No results for input(s): CKTOTAL, CKMB, CKMBINDEX, TROPONINI in the last 168 hours. BNP (last 3 results) No results for input(s): PROBNP in the last 8760 hours. HbA1C: No results for input(s): HGBA1C in the last 72 hours. CBG: No results for input(s): GLUCAP in the last 168 hours. Lipid Profile: No results for input(s): CHOL, HDL, LDLCALC, TRIG, CHOLHDL, LDLDIRECT in the last 72 hours. Thyroid Function Tests: Recent Labs    10/08/19 1537  TSH 0.778   Anemia Panel: No results for input(s): VITAMINB12, FOLATE, FERRITIN, TIBC, IRON, RETICCTPCT in the last 72 hours. Sepsis Labs: No results for input(s): PROCALCITON, LATICACIDVEN in the last 168 hours.  Recent Results (from the past 240 hour(s))  Respiratory Panel by RT  PCR (Flu A&B, Covid) - Nasopharyngeal Swab     Status: None   Collection Time: 10/08/19 12:28 PM   Specimen: Nasopharyngeal Swab  Result Value Ref Range Status   SARS Coronavirus 2 by RT PCR NEGATIVE NEGATIVE Final    Comment: (NOTE) SARS-CoV-2 target nucleic acids are NOT DETECTED. The SARS-CoV-2 RNA is generally detectable in upper respiratoy specimens during the acute phase of infection. The lowest concentration of SARS-CoV-2 viral copies this assay can detect is 131 copies/mL. A negative result does not preclude SARS-Cov-2 infection and should not be used as the sole basis for treatment or other patient management decisions. A negative result may occur with  improper specimen collection/handling, submission of specimen other than nasopharyngeal swab, presence of viral mutation(s) within the areas targeted by this assay, and inadequate number of viral copies (<131 copies/mL). A negative result must be combined with  clinical observations, patient history, and epidemiological information. The expected result is Negative. Fact Sheet for Patients:  https://www.moore.com/ Fact Sheet for Healthcare Providers:  https://www.young.biz/ This test is not yet ap proved or cleared by the [place] FDA and  has been authorized for detection and/or diagnosis of SARS-CoV-2 by FDA under an Emergency Use Authorization (EUA). This EUA will remain  in effect (meaning this test can be used) for the duration of the COVID-19 declaration under Section 564(b)(1) of the Act, 21 U.S.C. section 360bbb-3(b)(1), unless the authorization is terminated or revoked sooner.    Influenza A by PCR NEGATIVE NEGATIVE Final   Influenza B by PCR NEGATIVE NEGATIVE Final    Comment: (NOTE) The Xpert Xpress SARS-CoV-2/FLU/RSV assay is intended as an aid in  the diagnosis of influenza from Nasopharyngeal swab specimens and  should not be used as a sole basis for treatment. Nasal washings and  aspirates are unacceptable for Xpert Xpress SARS-CoV-2/FLU/RSV  testing. Fact Sheet for Patients: https://www.moore.com/ Fact Sheet for Healthcare Providers: https://www.young.biz/ This test is not yet approved or cleared by the [place] FDA and  has been authorized for detection and/or diagnosis of SARS-CoV-2 by  FDA under an Emergency Use Authorization (EUA). This EUA will remain  in effect (meaning this test can be used) for the duration of the  Covid-19 declaration under Section 564(b)(1) of the Act, 21  U.S.C. section 360bbb-3(b)(1), unless the authorization is  terminated or revoked. Performed at [facility], [address], [place], [place] [address]   MRSA PCR Screening     Status: None   Collection Time: 10/10/19  9:58 AM   Specimen: Nasal Mucosa; Nasopharyngeal  Result Value Ref Range Status   MRSA by PCR NEGATIVE NEGATIVE Final    Comment:         The GeneXpert MRSA Assay (FDA approved for NASAL specimens only), is one component of a comprehensive MRSA colonization surveillance program. It is not intended to diagnose MRSA infection nor to guide or monitor treatment for MRSA infections. Performed at [facility], [address], [place], [place] [address]       Radiology Studies: No results found.    Scheduled Meds: . carvedilol  3.125 mg Oral BID WC  . digoxin  0.125 mg Oral Daily  . losartan  12.5 mg Oral BID  . sodium chloride flush  3 mL Intravenous Q12H  . spironolactone  25 mg Oral Daily   Continuous Infusions: . sodium chloride    . sodium chloride Stopped (10/10/19 1549)     LOS: 3 days      Time  spent: 25 minutes   Noralee Stain, DO [facility] 10/11/2019, 10:03 AM   Available via Epic secure chat 7am-7pm After these hours, please refer to coverage provider listed on [email or web address]

## 2019-10-11 NOTE — Progress Notes (Signed)
Patient ID: Andrew Hinton, male   DOB: Mar 29, 1976, 44 y.o.   MRN: [id number]     Advanced Heart Failure Rounding Note  PCP-Cardiologist: No primary care provider on file.   Subjective:    Still with on and off headache, also with vertigo symptoms.  Able to walk some.  BP stable, SBP 110s.    Objective:   Weight Range: 88.6 kg Body mass index is 26.49 kg/m.   Vital Signs:   Temp:  [97.6 F (36.4 C)-98.1 F (36.7 C)] 97.6 F (36.4 C) (02/28 0801) Pulse Rate:  [57-83] 57 (02/28 0801) Resp:  [11-18] 16 (02/28 0801) BP: (109-122)/(57-87) 118/83 (02/28 0801) SpO2:  [93 %-100 %] 99 % (02/28 0801) Last BM Date: 10/08/19  Weight change: Filed Weights   10/08/19 0811 10/09/19 2118  Weight: 93 kg 88.6 kg    Intake/Output:   Intake/Output Summary (Last 24 hours) at 10/11/2019 0845 Last data filed at 10/11/2019 0449 Gross per 24 hour  Intake 99.74 ml  Output 2425 ml  Net -2325.26 ml      Physical Exam    General: NAD Neck: No JVD, no thyromegaly or thyroid nodule.  Lungs: Clear to auscultation bilaterally with normal respiratory effort. CV: Nondisplaced PMI.  Heart regular S1/S2, no S3/S4, no murmur.  No peripheral edema.   Abdomen: Soft, nontender, no hepatosplenomegaly, no distention.  Skin: Intact without lesions or rashes.  Neurologic: Alert and oriented x 3.  Psych: Normal affect. Extremities: No clubbing or cyanosis.  HEENT: Normal.    Telemetry   NSR 80s (personally reviewed)  Labs    CBC Recent Labs    10/08/19 0908 10/09/19 0311  WBC 7.6 8.6  NEUTROABS 5.4  --   HGB 14.0 13.6  HCT 42.3 41.0  MCV 96.4 96.7  PLT 332 329   Basic Metabolic Panel Recent Labs    08/65/78 1537 10/09/19 0311 10/10/19 0257 10/11/19 0523  NA  --    < > 137 134*  K  --    < > 4.3 4.1  CL  --    < > 101 100  CO2  --    < > 27 24  GLUCOSE  --    < > 111* 106*  BUN  --    < > 16 15  CREATININE  --    < > 1.32* 1.16  CALCIUM  --    < > 9.1 9.1  MG 2.2  --    --   --    < > = values in this interval not displayed.   Liver Function Tests Recent Labs    10/08/19 0908 10/09/19 0311  AST 50* 29  ALT 86* 65*  ALKPHOS 51 52  BILITOT 0.6 0.8  PROT 7.5 6.5  ALBUMIN 4.1 3.5   No results for input(s): LIPASE, AMYLASE in the last 72 hours. Cardiac Enzymes No results for input(s): CKTOTAL, CKMB, CKMBINDEX, TROPONINI in the last 72 hours.  BNP: BNP (last 3 results) No results for input(s): BNP in the last 8760 hours.  ProBNP (last 3 results) No results for input(s): PROBNP in the last 8760 hours.   D-Dimer No results for input(s): DDIMER in the last 72 hours. Hemoglobin A1C No results for input(s): HGBA1C in the last 72 hours. Fasting Lipid Panel No results for input(s): CHOL, HDL, LDLCALC, TRIG, CHOLHDL, LDLDIRECT in the last 72 hours. Thyroid Function Tests Recent Labs    10/08/19 1537  TSH 0.778    Other results:  Imaging    No results found.   Medications:     Scheduled Medications: . carvedilol  3.125 mg Oral BID WC  . digoxin  0.125 mg Oral Daily  . losartan  12.5 mg Oral BID  . sodium chloride flush  3 mL Intravenous Q12H  . spironolactone  25 mg Oral Daily    Infusions: . sodium chloride    . sodium chloride Stopped (10/10/19 1549)    PRN Medications: sodium chloride, acetaminophen **OR** acetaminophen, meclizine, morphine injection, ondansetron **OR** ondansetron (ZOFRAN) IV, oxyCODONE-acetaminophen, sodium chloride flush   Assessment/Plan   1. Syncope: Patient had some orthostatic symptoms at home, possible that syncope was an orthostatic event.  However, with markedly low EF and the feeling of his heart racing prior to syncope, I am concerned for VT/VF.  No events on telemetry in the hospital.  - He will need a Lifevest at discharge.  2. Cardiomyopathy: Newly found, uncertain etiology.  The LV is dilated with EF < 20%, the RV appears normal.  Given the LV dilation, I suspect that the cardiomyopathy  is not new (probably less likely to be a stress cardiomyopathy related to the subarachnoid hemorrhage). He was a heavy drinker at one point but not recently, possible ETOH cardiomyopathy but would have had to be long-standing and just now coming to light, as he reports no heavy drinking for 2 years.  Cannot rule out viral cardiomyopathy or CAD.  He has multiple family members with "heart disease" but he does not have details.   He will need investigation for cause of cardiomyopathy and will need to try to get him on meds to treat it.  His symptoms currently seem to be due to vertigo and not orthostasis. He is not volume overloaded on exam.   - Continue digoxin 0.125 mg daily. - Increase spironolactone to 25 mg daily.  - Increase losartan to 12.5 bid.  - Add Coreg 3.125 mg bid.  - I would like to do left/right heart cath, diagnostic only given recent small SAH (avoid anticoagulation/antiplatelets).  Would anticipate right/left heart cath Monday, probably use femoral access for coronaries to avoid need for heparin. We discussed risks/benefits and he agrees to procedure.  - If cath unremarkable, will need cardiac MRI.  3. SAH: Small volume SAH post-fall.  Per neurosurgery, conservative mangement.   Length of Stay: 3  Loralie Champagne, MD  10/11/2019, 8:45 AM  Advanced Heart Failure Team Pager [phone] (M-F; [place])  Please contact [facility] for night-coverage after hours (4p -7a ) and weekends on [email or web address]

## 2019-10-11 NOTE — Progress Notes (Signed)
Physical Therapy Treatment Patient Details Name: Andrew Hinton MRN: [id number] DOB: Sep 24, 1975 Today's Date: 10/11/2019    History of Present Illness Pt is a 44 y.o. M with significant PMH of anxiety, asthma, depression. Presents following a fall down 15 steps to his basement; possible syncopal episode. CT showing small volume SAH right temporal and temporoparietal area and nondisplaced fracture left temporal bone. Pt also with left hip/gluteal region hematoma.     PT Comments    Pt continues to be limited by headache and dizziness. Reports he feels the "room is spinning," when he sits up towards the left side of the bed and symptoms are < 1 minute in duration. Pt positive on [facility] assessment on left with upward torsional nystagmus. Subsequently performed the Canalith Repositioning Maneuver and pt reporting improved symptoms once completed. Provided pt with written handout for home Epley maneuver if symptoms persist. Pt deferring further mobility at this time.    Follow Up Recommendations  No PT follow up;Supervision - Intermittent     Equipment Recommendations  None recommended by PT    Recommendations for Other Services       Precautions / Restrictions Precautions Precautions: Fall Restrictions Weight Bearing Restrictions: No    Mobility  Bed Mobility Overal bed mobility: Independent                Transfers Overall transfer level: Independent Equipment used: None                Ambulation/Gait             General Gait Details: deferred by pt   Stairs             Wheelchair Mobility    Modified Rankin (Stroke Patients Only) Modified Rankin (Stroke Patients Only) Pre-Morbid Rankin Score: No symptoms Modified Rankin: Moderately severe disability     Balance Overall balance assessment: No apparent balance deficits (not formally assessed)                                          Cognition Arousal/Alertness:  Awake/alert Behavior During Therapy: WFL for tasks assessed/performed Overall Cognitive Status: Within Functional Limits for tasks assessed                                        Exercises      General Comments        Pertinent Vitals/Pain Pain Assessment: Faces Faces Pain Scale: Hurts even more Pain Location: headache Pain Descriptors / Indicators: Headache Pain Intervention(s): Limited activity within patient's tolerance;Monitored during session;Patient requesting pain meds-RN notified    Home Living                      Prior Function            PT Goals (current goals can now be found in the care plan section) Acute Rehab PT Goals Patient Stated Goal: to get back to work Potential to Achieve Goals: Good    Frequency    Min 3X/week      PT Plan Current plan remains appropriate    Co-evaluation              AM-PAC PT "6 Clicks" Mobility   Outcome Measure  Help needed turning from your back to  your side while in a flat bed without using bedrails?: None Help needed moving from lying on your back to sitting on the side of a flat bed without using bedrails?: None Help needed moving to and from a bed to a chair (including a wheelchair)?: None Help needed standing up from a chair using your arms (e.g., wheelchair or bedside chair)?: None Help needed to walk in hospital room?: A Little Help needed climbing 3-5 steps with a railing? : A Little 6 Click Score: 22    End of Session   Activity Tolerance: Patient limited by pain Patient left: in bed;with call bell/phone within reach Nurse Communication: Mobility status PT Visit Diagnosis: Unsteadiness on feet (R26.81);Pain     Time: 0920-0938 PT Time Calculation (min) (ACUTE ONLY): 18 min  Charges:  $Canalith Rep Proc: 8-22 mins                       Andrew Hinton, PT, DPT Acute Rehabilitation Services Pager [phone] Office [phone]    Andrew Hinton 10/11/2019, 2:40 PM

## 2019-10-12 ENCOUNTER — Encounter (HOSPITAL_COMMUNITY)
Admission: EM | Disposition: A | Discharge status: Home / Self Care | Payer: Self-pay | Source: Home / Self Care | Attending: Internal Medicine

## 2019-10-12 DIAGNOSIS — I429 Cardiomyopathy, unspecified: Secondary | ICD-10-CM

## 2019-10-12 HISTORY — PX: RIGHT/LEFT HEART CATH AND CORONARY ANGIOGRAPHY: CATH118266

## 2019-10-12 LAB — CBC
HCT: 42.6 % (ref 39.0–52.0)
Hemoglobin: 14.2 g/dL (ref 13.0–17.0)
MCH: 31.7 pg (ref 26.0–34.0)
MCHC: 33.3 g/dL (ref 30.0–36.0)
MCV: 95.1 fL (ref 80.0–100.0)
Platelets: 343 10*3/uL (ref 150–400)
RBC: 4.48 MIL/uL (ref 4.22–5.81)
RDW: 12 % (ref 11.5–15.5)
WBC: 7.8 10*3/uL (ref 4.0–10.5)
nRBC: 0 % (ref 0.0–0.2)

## 2019-10-12 LAB — POCT I-STAT EG7
Bicarbonate: 25.9 mmol/L (ref 20.0–28.0)
Bicarbonate: 26.3 mmol/L (ref 20.0–28.0)
Calcium, Ion: 1.28 mmol/L (ref 1.15–1.40)
Calcium, Ion: 1.3 mmol/L (ref 1.15–1.40)
HCT: 44 % (ref 39.0–52.0)
HCT: 45 % (ref 39.0–52.0)
Hemoglobin: 15 g/dL (ref 13.0–17.0)
Hemoglobin: 15.3 g/dL (ref 13.0–17.0)
O2 Saturation: 66 %
O2 Saturation: 69 %
Potassium: 4.2 mmol/L (ref 3.5–5.1)
Potassium: 4.2 mmol/L (ref 3.5–5.1)
Sodium: 137 mmol/L (ref 135–145)
Sodium: 137 mmol/L (ref 135–145)
TCO2: 27 mmol/L (ref 22–32)
TCO2: 28 mmol/L (ref 22–32)
pCO2, Ven: 46.1 mmHg (ref 44.0–60.0)
pCO2, Ven: 46.3 mmHg (ref 44.0–60.0)
pH, Ven: 7.358 (ref 7.250–7.430)
pH, Ven: 7.362 (ref 7.250–7.430)
pO2, Ven: 36 mmHg (ref 32.0–45.0)
pO2, Ven: 38 mmHg (ref 32.0–45.0)

## 2019-10-12 LAB — BASIC METABOLIC PANEL
Anion gap: 10 (ref 5–15)
BUN: 21 mg/dL — ABNORMAL HIGH (ref 6–20)
CO2: 24 mmol/L (ref 22–32)
Calcium: 9.4 mg/dL (ref 8.9–10.3)
Chloride: 103 mmol/L (ref 98–111)
Creatinine, Ser: 1.1 mg/dL (ref 0.61–1.24)
GFR calc Af Amer: >60 mL/min (ref >60)
GFR calc non Af Amer: >60 mL/min (ref >60)
Glucose, Bld: 106 mg/dL — ABNORMAL HIGH (ref 70–99)
Potassium: 4.4 mmol/L (ref 3.5–5.1)
Sodium: 137 mmol/L (ref 135–145)

## 2019-10-12 LAB — GLUCOSE, CAPILLARY
Glucose-Capillary: 102 mg/dL — ABNORMAL HIGH (ref 70–99)
Glucose-Capillary: 72 mg/dL (ref 70–99)

## 2019-10-12 SURGERY — RIGHT/LEFT HEART CATH AND CORONARY ANGIOGRAPHY
Anesthesia: LOCAL

## 2019-10-12 MED ORDER — LIDOCAINE HCL (PF) 1 % IJ SOLN
INTRAMUSCULAR | Status: AC
  Filled 2019-10-12: qty 30

## 2019-10-12 MED ORDER — SODIUM CHLORIDE 0.9 % IV SOLN: 250.0000 mL | INTRAVENOUS | Status: DC | PRN

## 2019-10-12 MED ORDER — HEPARIN (PORCINE) IN NACL 1000-0.9 UT/500ML-% IV SOLN
INTRAVENOUS | Status: DC | PRN
  Administered 2019-10-12 (×2): 500 mL

## 2019-10-12 MED ORDER — HEPARIN (PORCINE) IN NACL 1000-0.9 UT/500ML-% IV SOLN
INTRAVENOUS | Status: AC
  Filled 2019-10-12: qty 1000

## 2019-10-12 MED ORDER — ASPIRIN 81 MG PO CHEW
81.0000 mg | CHEWABLE_TABLET | Freq: Once | ORAL | Status: AC
  Administered 2019-10-12: 81 mg via ORAL
  Filled 2019-10-12: qty 1

## 2019-10-12 MED ORDER — SODIUM CHLORIDE 0.9% FLUSH: 3.0000 mL | Freq: Two times a day (BID) | INTRAVENOUS | Status: DC

## 2019-10-12 MED ORDER — ONDANSETRON HCL 4 MG/2ML IJ SOLN: 4.0000 mg | Freq: Four times a day (QID) | INTRAMUSCULAR | Status: DC | PRN

## 2019-10-12 MED ORDER — MIDAZOLAM HCL 2 MG/2ML IJ SOLN
INTRAMUSCULAR | Status: DC | PRN
  Administered 2019-10-12 (×2): 1 mg via INTRAVENOUS

## 2019-10-12 MED ORDER — SODIUM CHLORIDE 0.9 % IV SOLN: INTRAVENOUS | Status: DC

## 2019-10-12 MED ORDER — ACETAMINOPHEN 325 MG PO TABS: 650.0000 mg | ORAL_TABLET | ORAL | Status: DC | PRN

## 2019-10-12 MED ORDER — HYDRALAZINE HCL 20 MG/ML IJ SOLN: 10.0000 mg | INTRAMUSCULAR | Status: DC | PRN

## 2019-10-12 MED ORDER — OXYCODONE-ACETAMINOPHEN 5-325 MG PO TABS
1.0000 | ORAL_TABLET | ORAL | Status: DC
  Administered 2019-10-12 – 2019-10-14 (×13): 1 via ORAL
  Filled 2019-10-12 (×13): qty 1

## 2019-10-12 MED ORDER — SODIUM CHLORIDE 0.9 % IV SOLN: INTRAVENOUS | Status: AC

## 2019-10-12 MED ORDER — SODIUM CHLORIDE 0.9% FLUSH: 3.0000 mL | INTRAVENOUS | Status: DC | PRN

## 2019-10-12 MED ORDER — LIDOCAINE HCL (PF) 1 % IJ SOLN
INTRAMUSCULAR | Status: DC | PRN
  Administered 2019-10-12: 20 mL

## 2019-10-12 MED ORDER — HEPARIN SODIUM (PORCINE) 1000 UNIT/ML IJ SOLN
INTRAMUSCULAR | Status: AC
  Filled 2019-10-12: qty 1

## 2019-10-12 MED ORDER — IOHEXOL 350 MG/ML SOLN
INTRAVENOUS | Status: DC | PRN
  Administered 2019-10-12: 50 mL via INTRA_ARTERIAL

## 2019-10-12 MED ORDER — FENTANYL CITRATE (PF) 100 MCG/2ML IJ SOLN
INTRAMUSCULAR | Status: DC | PRN
  Administered 2019-10-12 (×2): 25 ug via INTRAVENOUS

## 2019-10-12 MED ORDER — MIDAZOLAM HCL 2 MG/2ML IJ SOLN
INTRAMUSCULAR | Status: AC
  Filled 2019-10-12: qty 2

## 2019-10-12 MED ORDER — FENTANYL CITRATE (PF) 100 MCG/2ML IJ SOLN
INTRAMUSCULAR | Status: AC
  Filled 2019-10-12: qty 2

## 2019-10-12 MED ORDER — LABETALOL HCL 5 MG/ML IV SOLN: 10.0000 mg | INTRAVENOUS | Status: DC | PRN

## 2019-10-12 MED ORDER — VERAPAMIL HCL 2.5 MG/ML IV SOLN
INTRAVENOUS | Status: AC
  Filled 2019-10-12: qty 2

## 2019-10-12 SURGICAL SUPPLY — 12 items
CATH BALLN WEDGE 5F 110CM (CATHETERS) ×2 IMPLANT
CATH DXT MULTI JL4 JR4 ANG PIG (CATHETERS) ×2 IMPLANT
CLOSURE MYNX CONTROL 5F (Vascular Products) ×2 IMPLANT
GLIDESHEATH SLEND SS 6F .021 (SHEATH) ×2 IMPLANT
GUIDEWIRE INQWIRE 1.5J.035X260 (WIRE) ×1 IMPLANT
INQWIRE 1.5J .035X260CM (WIRE) ×2
KIT HEART LEFT (KITS) ×2 IMPLANT
PACK CARDIAC CATHETERIZATION (CUSTOM PROCEDURE TRAY) ×2 IMPLANT
SHEATH GLIDE SLENDER 4/5FR (SHEATH) ×2 IMPLANT
SHEATH PINNACLE 5F 10CM (SHEATH) ×2 IMPLANT
TRANSDUCER W/STOPCOCK (MISCELLANEOUS) ×2 IMPLANT
WIRE EMERALD 3MM-J .035X150CM (WIRE) ×2 IMPLANT

## 2019-10-12 NOTE — Plan of Care (Signed)
  Problem: Activity: Goal: Ability to return to baseline activity level will improve Outcome: Progressing   

## 2019-10-12 NOTE — Progress Notes (Signed)
PROGRESS NOTE    Avyon Herendeen  QBH:[id number] DOB: 01-27-1976 DOA: 10/08/2019 PCP: Patient, No Pcp Per     Brief Narrative:  Seann Genther is a 44 y.o. male with medical history significant for anxiety asthma depression. Patient was getting ready for work when he opened his basement steps, he was standing at the top of the basement and the next thing he knew, he was waking up at the bottom of the steps.  The last thing he remembers is being at the top of the stairs.  He woke up to his 33 year old son shouting and trying to shake him awake.  He thinks he might have been out for about 10 minutes. He reports he woke up this morning feeling a bit different.  When he was standing at the top of the stairs, he reports a lightheaded feeling, seeing spots like he was going to pass out. He also reports dizziness over the past several months, which worsens with positional change, moving his head. Head CT revealed acute subarachnoid hemorrhage in the right temporal and temporoparietal region, no associated mass-effect or midline shift.  Also nondisplaced fracture left temporal bone. Imaging included left shoulder and chest x-ray which were negative for acute abnormality.  CT abdomen and chest with contrast showed soft tissue hematoma overlying the left hip/gluteal lesion measuring 6.5 cm. Case discussed with neurosurgeon on-call; recommended admission overnight and repeat CT head and observation. Repeat head CT stable. Echo obtained as part of syncope work up and revealed new systolic dysfunction EF < 20%. Cardiology consulted.   New events last 24 hours / Subjective: Continues to have a headache, some dizziness which has improved.  No chest pain this morning.  Assessment & Plan:   Principal Problem:   Syncope Active Problems:   Subarachnoid hemorrhage (HCC)   AKI (acute kidney injury) (HCC)   Acute systolic CHF (congestive heart failure) (HCC)   Syncope -Echocardiogram as below  -Will need  Lifevest at discharge  -Telemetry  -Meclizine as needed for vertigo   Subarachnoid hemorrhage -EDP discussed with neurosurgery on-call, Dr. Vertell Limber, no recommendations for surgical intervention at this time.  Recommended observation and repeat head CT -CT head 2/25 with acute subarachnoid hemorrhage in the right temporal and temporoparietal region without mass-effect or midline shift.  No cervical spine fracture. -Repeat head CT 2/26 with no significant change in small volume subarachnoid hemorrhage in the right temporal lobe.  No midline shift or mass-effect.  No increase in hemorrhage compared to exam  New onset systolic heart failure, cardiomyopathy  -Echocardiogram revealed EF <20% with global hypokinesis  -Cardiology consulted; planning heart cath today  -Digoxin, losartan, spironolactone, Coreg  AKI -Baseline creatinine ~1 -Resolved  Left temporal bone fracture, nondisplaced Left hip/gluteal hematoma -Left shoulder x-ray without fracture or dislocation -Chest x-ray without acute abnormality, CT chest without acute findings in the chest.  No acute osseous abnormality -CTA head and neck with nondisplaced fracture left temporal bone with overlying soft tissue swelling in the scalp -Secondary to fall.  Supportive care -Will schedule oxycodone to stay ahead of pain.  Morphine IV available as needed  Diarrhea -Now resolved.  Discontinued C. difficile test     DVT prophylaxis: SCD Code Status: Full code Family Communication: None at bedside Disposition Plan:  . Patient is from home prior to admission. . Currently in-hospital treatment needed due to further work-up for new onset systolic heart failure. Heart cath planned today  . Suspect patient will discharge home after cardiology work up complete.  Consultants:   Neurosurgery over the phone  Cardiology  Procedures:   None  Antimicrobials:  Anti-infectives (From admission, onward)   None        Objective: Vitals:   10/11/19 2340 10/12/19 0348 10/12/19 0810 10/12/19 0900  BP: 109/71 (!) 89/59 109/64   Pulse: (!) 59 80 60   Resp: 17 18 18 18   Temp: 97.7 F (36.5 C) 97.9 F (36.6 C) 97.8 F (36.6 C)   TempSrc: Oral Oral Oral   SpO2: 100% 98% 99%   Weight:      Height:        Intake/Output Summary (Last 24 hours) at 10/12/2019 1046 Last data filed at 10/12/2019 12/12/2019 Gross per 24 hour  Intake --  Output 1250 ml  Net -1250 ml   Filed Weights   10/08/19 0811 10/09/19 2118  Weight: 93 kg 88.6 kg    Examination: General exam: Appears calm and comfortable  Respiratory system: Clear to auscultation. Respiratory effort normal. Cardiovascular system: S1 & S2 heard, RRR. No pedal edema. Gastrointestinal system: Abdomen is nondistended, soft and nontender. Normal bowel sounds heard. Central nervous system: Alert and oriented. Non focal exam. Speech clear  Extremities: Symmetric in appearance bilaterally  Skin: No rashes, lesions or ulcers on exposed skin  Psychiatry: Judgement and insight appear stable. Mood & affect appropriate.    Data Reviewed: I have personally reviewed following labs and imaging studies  CBC: Recent Labs  Lab 10/08/19 0908 10/09/19 0311 10/12/19 0509  WBC 7.6 8.6 7.8  NEUTROABS 5.4  --   --   HGB 14.0 13.6 14.2  HCT 42.3 41.0 42.6  MCV 96.4 96.7 95.1  PLT 332 329 343   Basic Metabolic Panel: Recent Labs  Lab 10/08/19 0908 10/08/19 1537 10/09/19 0311 10/10/19 0257 10/11/19 0523 10/12/19 0509  NA 141  --  138 137 134* 137  K 4.1  --  4.0 4.3 4.1 4.4  CL 108  --  101 101 100 103  CO2 23  --  27 27 24 24   GLUCOSE 102*  --  97 111* 106* 106*  BUN 18  --  18 16 15  21*  CREATININE 0.94  --  1.46* 1.32* 1.16 1.10  CALCIUM 8.8*  --  8.9 9.1 9.1 9.4  MG  --  2.2  --   --   --   --    GFR: Estimated Creatinine Clearance: 96 mL/min (by C-G formula based on SCr of 1.1 mg/dL). Liver Function Tests: Recent Labs  Lab 10/08/19 0908  10/09/19 0311  AST 50* 29  ALT 86* 65*  ALKPHOS 51 52  BILITOT 0.6 0.8  PROT 7.5 6.5  ALBUMIN 4.1 3.5   No results for input(s): LIPASE, AMYLASE in the last 168 hours. No results for input(s): AMMONIA in the last 168 hours. Coagulation Profile: No results for input(s): INR, PROTIME in the last 168 hours. Cardiac Enzymes: No results for input(s): CKTOTAL, CKMB, CKMBINDEX, TROPONINI in the last 168 hours. BNP (last 3 results) No results for input(s): PROBNP in the last 8760 hours. HbA1C: No results for input(s): HGBA1C in the last 72 hours. CBG: No results for input(s): GLUCAP in the last 168 hours. Lipid Profile: No results for input(s): CHOL, HDL, LDLCALC, TRIG, CHOLHDL, LDLDIRECT in the last 72 hours. Thyroid Function Tests: No results for input(s): TSH, T4TOTAL, FREET4, T3FREE, THYROIDAB in the last 72 hours. Anemia Panel: No results for input(s): VITAMINB12, FOLATE, FERRITIN, TIBC, IRON, RETICCTPCT in the last 72 hours. Sepsis  Labs: No results for input(s): PROCALCITON, LATICACIDVEN in the last 168 hours.  Recent Results (from the past 240 hour(s))  Respiratory Panel by RT PCR (Flu A&B, Covid) - Nasopharyngeal Swab     Status: None   Collection Time: 10/08/19 12:28 PM   Specimen: Nasopharyngeal Swab  Result Value Ref Range Status   SARS Coronavirus 2 by RT PCR NEGATIVE NEGATIVE Final    Comment: (NOTE) SARS-CoV-2 target nucleic acids are NOT DETECTED. The SARS-CoV-2 RNA is generally detectable in upper respiratoy specimens during the acute phase of infection. The lowest concentration of SARS-CoV-2 viral copies this assay can detect is 131 copies/mL. A negative result does not preclude SARS-Cov-2 infection and should not be used as the sole basis for treatment or other patient management decisions. A negative result may occur with  improper specimen collection/handling, submission of specimen other than nasopharyngeal swab, presence of viral mutation(s) within  the areas targeted by this assay, and inadequate number of viral copies (<131 copies/mL). A negative result must be combined with clinical observations, patient history, and epidemiological information. The expected result is Negative. Fact Sheet for Patients:  https://www.moore.com/ Fact Sheet for Healthcare Providers:  https://www.young.biz/ This test is not yet ap proved or cleared by the [place] FDA and  has been authorized for detection and/or diagnosis of SARS-CoV-2 by FDA under an Emergency Use Authorization (EUA). This EUA will remain  in effect (meaning this test can be used) for the duration of the COVID-19 declaration under Section 564(b)(1) of the Act, 21 U.S.C. section 360bbb-3(b)(1), unless the authorization is terminated or revoked sooner.    Influenza A by PCR NEGATIVE NEGATIVE Final   Influenza B by PCR NEGATIVE NEGATIVE Final    Comment: (NOTE) The Xpert Xpress SARS-CoV-2/FLU/RSV assay is intended as an aid in  the diagnosis of influenza from Nasopharyngeal swab specimens and  should not be used as a sole basis for treatment. Nasal washings and  aspirates are unacceptable for Xpert Xpress SARS-CoV-2/FLU/RSV  testing. Fact Sheet for Patients: https://www.moore.com/ Fact Sheet for Healthcare Providers: https://www.young.biz/ This test is not yet approved or cleared by the [place] FDA and  has been authorized for detection and/or diagnosis of SARS-CoV-2 by  FDA under an Emergency Use Authorization (EUA). This EUA will remain  in effect (meaning this test can be used) for the duration of the  Covid-19 declaration under Section 564(b)(1) of the Act, 21  U.S.C. section 360bbb-3(b)(1), unless the authorization is  terminated or revoked. Performed at [facility], [address], [place], [place] [address]   MRSA PCR Screening     Status: None   Collection Time: 10/10/19  9:58 AM    Specimen: Nasal Mucosa; Nasopharyngeal  Result Value Ref Range Status   MRSA by PCR NEGATIVE NEGATIVE Final    Comment:        The GeneXpert MRSA Assay (FDA approved for NASAL specimens only), is one component of a comprehensive MRSA colonization surveillance program. It is not intended to diagnose MRSA infection nor to guide or monitor treatment for MRSA infections. Performed at [facility], [address], [place], [place] [address]       Radiology Studies: No results found.    Scheduled Meds: . carvedilol  3.125 mg Oral BID WC  . digoxin  0.125 mg Oral Daily  . losartan  12.5 mg Oral BID  . oxyCODONE-acetaminophen  1 tablet Oral Q4H  . sodium chloride flush  3 mL Intravenous Q12H  . spironolactone  25 mg Oral Daily   Continuous Infusions: . sodium chloride    . sodium chloride 10 mL/hr at 10/12/19 0941     LOS: 4 days      Time spent: 25 minutes   Noralee Stain, DO [facility] 10/12/2019, 10:46 AM   Available via Epic secure chat 7am-7pm After these hours, please refer to coverage provider listed on [email or web address]

## 2019-10-12 NOTE — Progress Notes (Signed)
OT Cancellation Note  Patient Details Name: Andrew Hinton MRN: [id number] DOB: 03-06-1976   Cancelled Treatment:    Reason Eval/Treat Not Completed: Patient at procedure or test/ unavailable;Other (comment) pt out of room for cardiac cath procedure. Will check back as time allows for OT session.   Audery Amel., COTA/L Acute Rehabilitation Services [phone]   Angelina Pih 10/12/2019, 2:04 PM

## 2019-10-12 NOTE — Progress Notes (Addendum)
Physical Therapy Treatment/Vestibular Treatment Patient Details Name: Andrew Hinton MRN: [id number] DOB: 1976/04/29 Today's Date: 10/12/2019    History of Present Illness Pt is a 44 y.o. M with significant PMH of anxiety, asthma, depression. Presents following a fall down 15 steps to his basement; possible syncopal episode. CT showing small volume SAH right temporal and temporoparietal area and nondisplaced fracture left temporal bone. Pt also with left hip/gluteal region hematoma and L shoulder pain.     PT Comments    Pt continues to have L posterior canal BPPV with (+) L dix hallpike.  He responded well to Epley's x 2 to the let with diminished symptoms the second time through. Pt continues to report significant HA, light and noise sensitivity, so I reviewed education re: head injury and decreasing stimuli to try to not chronically trigger his HA.  He verbalized understanding.  He remains mildly unsteady on his feet requiring min guard assist for balance during gait.  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  PT will continue to follow acutely for safe mobility progression.    Follow Up Recommendations  Outpatient PT;Other (comment)(OP vestibular therapy)     Equipment Recommendations  None recommended by PT    Recommendations for Other Services   NA     Precautions / Restrictions Precautions Precautions: Fall;Other (comment) Precaution Comments: light sensitive    Mobility  Bed Mobility Overal bed mobility: Independent                Transfers Overall transfer level: Needs assistance Equipment used: None Transfers: Sit to/from Stand Sit to Stand: Supervision         General transfer comment: supervision for safety, cues for standing before moving to walk for a few extra seconds to ensure he is not too dizzy.    Ambulation/Gait Ambulation/Gait assistance: Min guard Gait Distance (Feet): 20 Feet Assistive device: None Gait Pattern/deviations: Step-through pattern;Staggering right;Staggering left Gait velocity: decreased Gait velocity interpretation: 1.31 - 2.62 ft/sec, indicative of limited community ambulator General Gait Details: pt with slow staggering gait speed.  Only walked to bathroom and back as he feels a bit weak from being NPO for procedure.  He has to go slow due to dizziness with quick movements and needed min guard assist for balance.           Balance Overall balance assessment: Needs assistance Sitting-balance support: Feet supported;No upper extremity supported Sitting balance-Leahy Scale: Good     Standing balance support: No upper extremity supported Standing balance-Leahy Scale: Good             10/12/19 1319  Vestibular Assessment  General Observation lights off, room dark  Symptom Behavior  Subjective history of current problem spinning, short duration, HA, light and noise sensitivity  Type of Dizziness  Spinning  Frequency of Dizziness intermittent  Duration of Dizziness < 1 min  Symptom Nature Positional  Aggravating Factors Turning body quickly;Turning head quickly;Rolling to left  Relieving Factors Head stationary;Slow movements  Progression of Symptoms Better  Oculomotor Exam  Oculomotor Alignment Normal  Spontaneous Absent  Gaze-induced  Absent  Smooth Pursuits Intact  Saccades Intact  Oculomotor Exam-Fixation Suppressed   Left Head Impulse normal  Right Head Impulse normal  Auditory  Comments grossly equal  Positional Testing  Dix-Hallpike Dix-Hallpike Left  Horizontal Canal Testing Horizontal Canal Right;Horizontal Canal Left  Dix-Hallpike Left  Dix-Hallpike Left Duration 10  Dix-Hallpike Left Symptoms Upbeat, left rotatory nystagmus  Horizontal Canal Right  Horizontal Canal Right Duration 0  Horizontal Canal Right Symptoms Normal   Horizontal Canal Left  Horizontal Canal Left Duration 5  Horizontal Canal Left Symptoms Other (comment) (a few beats of rotational nystagmus)                       Cognition Arousal/Alertness: Awake/alert Behavior During Therapy: WFL for tasks assessed/performed Overall Cognitive Status: Within Functional Limits for tasks assessed                                               Pertinent Vitals/Pain Pain Assessment: Faces Faces Pain Scale: Hurts even more Pain Location: headache Pain Descriptors / Indicators: Headache Pain Intervention(s): Limited activity within patient's tolerance;Monitored during session;Patient requesting pain meds-RN notified           PT Goals (current goals can now be found in the care plan section) Acute Rehab PT Goals Patient Stated Goal: to get back to work and decrease HA.  Progress towards PT goals: Progressing toward goals    Frequency    Min 3X/week      PT Plan Current plan remains appropriate       AM-PAC PT "6 Clicks" Mobility   Outcome Measure  Help needed turning from your back to your side while in a flat bed without using bedrails?: None Help needed moving from lying on your back to sitting on the side of a flat bed without using bedrails?: None Help needed moving to and from a bed to a chair (including a wheelchair)?: A Little Help needed standing up from a chair using your arms (e.g., wheelchair or bedside chair)?: A Little Help needed to walk in hospital room?: A Little Help needed climbing 3-5 steps with a railing? : A Little 6 Click Score: 20    End of Session   Activity Tolerance: Patient limited by pain Patient left: in bed;with call bell/phone within reach;with bed alarm set Nurse Communication: Mobility status PT Visit Diagnosis: Unsteadiness on feet (R26.81);Pain;BPPV BPPV - Right/Left : Left Pain - Right/Left: Left Pain - part of body: (head)     Time: 8022-3361 PT Time  Calculation (min) (ACUTE ONLY): 31 min  Charges:  $Therapeutic Activity: 8-22 mins $Canalith Rep Proc: 8-22 mins                    Verdene Lennert, PT, DPT  Acute Rehabilitation [phone] pager #[phone] office  @ [facility]: [phone]   10/12/2019, 3:21 PM

## 2019-10-12 NOTE — Progress Notes (Signed)
Patient ID: Andrew Hinton, male   DOB: [birth year], 44 y.o.   MRN: [id number]     Advanced Heart Failure Rounding Note  PCP-Cardiologist: No primary care provider on file.   Subjective:    Still with on and off headache, vertigo symptoms improved.  Able to walk some.  BP stable, SBP 100s-110s.   LHC/RHC:  Coronary Findings  Diagnostic Dominance: Right Left Main  No significant coronary disease.  Left Anterior Descending  No significant coronary disease.  Left Circumflex  No significant coronary disease.  Right Coronary Artery  No significant coronary disease.  Intervention  No interventions have been documented. Right Heart  Right Heart Pressures RHC Procedural Findings: Hemodynamics (mmHg) RA mean 3 RV 21/4 PA 24/5, mean 11 PCWP mean 3 LV 90/11 AO 84/53  Oxygen saturations: PA 68% AO 98%  Cardiac Output (Fick) 4.85  Cardiac Index (Fick) 2.3      Objective:   Weight Range: 88.6 kg Body mass index is 26.49 kg/m.   Vital Signs:   Temp:  [97.6 F (36.4 C)-98.5 F (36.9 C)] 98.5 F (36.9 C) (03/01 1200) Pulse Rate:  [57-80] 70 (03/01 1339) Resp:  [7-27] 7 (03/01 1339) BP: (89-127)/(59-86) 99/67 (03/01 1339) SpO2:  [95 %-100 %] 98 % (03/01 1339) Last BM Date: 10/09/19  Weight change: Filed Weights   10/08/19 0811 10/09/19 2118  Weight: 93 kg 88.6 kg    Intake/Output:   Intake/Output Summary (Last 24 hours) at 10/12/2019 1350 Last data filed at 10/12/2019 1100 Gross per 24 hour  Intake 11.98 ml  Output 700 ml  Net -688.02 ml      Physical Exam    General: NAD Neck: No JVD, no thyromegaly or thyroid nodule.  Lungs: Clear to auscultation bilaterally with normal respiratory effort. CV: Nondisplaced PMI.  Heart regular S1/S2, no S3/S4, no murmur.  No peripheral edema.   Abdomen: Soft, nontender, no hepatosplenomegaly, no distention.  Skin: Intact without lesions or rashes.  Neurologic: Alert and oriented x 3.  Psych: Normal  affect. Extremities: No clubbing or cyanosis.  HEENT: Normal.   Telemetry   NSR 80s (personally reviewed)  Labs    CBC Recent Labs    10/12/19 0509  WBC 7.8  HGB 14.2  HCT 42.6  MCV 95.1  PLT 343   Basic Metabolic Panel Recent Labs    71/69/67 0523 10/12/19 0509  NA 134* 137  K 4.1 4.4  CL 100 103  CO2 24 24  GLUCOSE 106* 106*  BUN 15 21*  CREATININE 1.16 1.10  CALCIUM 9.1 9.4   Liver Function Tests No results for input(s): AST, ALT, ALKPHOS, BILITOT, PROT, ALBUMIN in the last 72 hours. No results for input(s): LIPASE, AMYLASE in the last 72 hours. Cardiac Enzymes No results for input(s): CKTOTAL, CKMB, CKMBINDEX, TROPONINI in the last 72 hours.  BNP: BNP (last 3 results) No results for input(s): BNP in the last 8760 hours.  ProBNP (last 3 results) No results for input(s): PROBNP in the last 8760 hours.   D-Dimer No results for input(s): DDIMER in the last 72 hours. Hemoglobin A1C No results for input(s): HGBA1C in the last 72 hours. Fasting Lipid Panel No results for input(s): CHOL, HDL, LDLCALC, TRIG, CHOLHDL, LDLDIRECT in the last 72 hours. Thyroid Function Tests No results for input(s): TSH, T4TOTAL, T3FREE, THYROIDAB in the last 72 hours.  Invalid input(s): FREET3  Other results:   Imaging    CARDIAC CATHETERIZATION  Result Date: 10/12/2019 1. Low filling pressures. 2. Preserved  cardiac output. 3. No significant coronary disease. Nonischemic cardiomyopathy.     Medications:     Scheduled Medications: . [MAR Hold] carvedilol  3.125 mg Oral BID WC  . [MAR Hold] digoxin  0.125 mg Oral Daily  . [MAR Hold] losartan  12.5 mg Oral BID  . [MAR Hold] oxyCODONE-acetaminophen  1 tablet Oral Q4H  . [MAR Hold] sodium chloride flush  3 mL Intravenous Q12H  . [MAR Hold] spironolactone  25 mg Oral Daily    Infusions: . sodium chloride    . sodium chloride 999 mL/hr at 10/12/19 1340    PRN Medications: sodium chloride, [MAR Hold]  acetaminophen **OR** [MAR Hold] acetaminophen, fentaNYL, Heparin (Porcine) in NaCl, iohexol, lidocaine (PF), [MAR Hold] meclizine, midazolam, [MAR Hold]  morphine injection, [MAR Hold] ondansetron **OR** [MAR Hold] ondansetron (ZOFRAN) IV, sodium chloride flush   Assessment/Plan   1. Syncope: Patient had some orthostatic symptoms at home, possible that syncope was an orthostatic event.  However, with markedly low EF and the feeling of his heart racing prior to syncope, I am concerned for VT/VF.  No events on telemetry in the hospital.  - He will need a Lifevest at discharge.  2. Cardiomyopathy: Newly found, nonischemic cardiomyopathy.  The LV is dilated with EF < 20%, the RV appears normal.  Given the LV dilation, I suspect that the cardiomyopathy is not new (probably less likely to be a stress cardiomyopathy related to the subarachnoid hemorrhage). He was a heavy drinker at one point but not recently, possible ETOH cardiomyopathy but would have had to be long-standing and just now coming to light, as he reports no heavy drinking for 2 years.  Cannot rule out viral cardiomyopathy.  He has multiple family members with "heart disease" but he does not have details. Coronary angiography today showed no significant CAD.  His symptoms currently seem to be due to vertigo and not orthostasis. He is not volume overloaded on exam and filling pressures were low on RHC today.   - Continue digoxin 0.125 mg daily. - Continue spironolactone 25 mg daily.  - Continue losartan 12.5 bid.  - Continue Coreg 3.125 mg bid.  - Will order cardiac MRI to look for infiltrative disease.  3. SAH: Small volume SAH post-fall.  Per neurosurgery, conservative management.   Will need Lifevest at time of discharge.   Length of Stay: [place], MD  10/12/2019, 1:50 PM  Advanced Heart Failure Team Pager [phone] (M-F; 7a - 4p)  Please contact [facility] for night-coverage after hours (4p -7a ) and weekends on  [email or web address]

## 2019-10-13 ENCOUNTER — Telehealth (HOSPITAL_COMMUNITY): Payer: Self-pay | Admitting: Licensed Clinical Social Worker

## 2019-10-13 ENCOUNTER — Inpatient Hospital Stay (HOSPITAL_COMMUNITY): Payer: Medicaid Other

## 2019-10-13 DIAGNOSIS — I5021 Acute systolic (congestive) heart failure: Secondary | ICD-10-CM

## 2019-10-13 LAB — BASIC METABOLIC PANEL
Anion gap: 9 (ref 5–15)
BUN: 21 mg/dL — ABNORMAL HIGH (ref 6–20)
CO2: 24 mmol/L (ref 22–32)
Calcium: 9.4 mg/dL (ref 8.9–10.3)
Chloride: 103 mmol/L (ref 98–111)
Creatinine, Ser: 1.12 mg/dL (ref 0.61–1.24)
GFR calc Af Amer: >60 mL/min (ref >60)
GFR calc non Af Amer: >60 mL/min (ref >60)
Glucose, Bld: 99 mg/dL (ref 70–99)
Potassium: 4.3 mmol/L (ref 3.5–5.1)
Sodium: 136 mmol/L (ref 135–145)

## 2019-10-13 MED ORDER — GADOBUTROL 1 MMOL/ML IV SOLN
12.0000 mL | Freq: Once | INTRAVENOUS | Status: AC | PRN
  Administered 2019-10-13: 12 mL via INTRAVENOUS

## 2019-10-13 NOTE — Telephone Encounter (Signed)
Outpatient Advanced HF CSW consulted to meet with patient due to multiple social concerns and likely need for outpatient follow up to address during clinic visits.  Heart and Vascular Care Navigation Initial Assessment:  Housing:  In what kind of housing do you live? House/apt/trailer/shelter? House  Do you rent/pay a mortgage/own? rent  Do you live with anyone? Two minor sons (43yo and 14yo) and his ex-inlaws (him and his wife have separated and he got custody of the children)  Are you currently worried about losing your housing? no  Within the past 12 months have you ever stayed outside, in a car, tent, a shelter, or temporarily with someone? no  Within the past 12 months have you been unable to get utilities when it was really needed? no  Social:  What is your current marital status? separated  Do you have any children? 3 sons  Do you have family or friends who live locally? Have some siblings but for the most part they have too many of their own concerns to be a support system- has sister who is an Charity fundraiser at [employer] who he says is a good support and is hopeful she can help guide him through his health concerns.  Food:  Within the past 12 months were you ever worried that food would run out before you got money to buy more? no  Within the past 31months have you run out of food and didn't have money to buy more? no  Income:  What is your current source of income? Working basically full time as a Curator.  Does a lot of contract work for someone but also has people bring their cars to his house to work on.  How hard is it for you to pay for the basics like food housing, medical care, and utilities? Not very hard  Do you have outstanding medical bills? Yes  Insurance:  Are you currently insured? no  Do you have prescription coverage? no  If no insurance, have you applied for coverage (Medicaid, disability, marketplace etc)? Pt reports that he received call from financial  counselor discussing Medicaid and believe he is eligible for Children and Family Medicaid since he is the caregiver to minor children- report they will send out paperwork to his home.  Transportation:  Do you have transportation to your medical appointments? Sometimes.   If yes, how? His father in law often provides him transportation to things as he does not have his own car.  States that his mother in law has been sick, however, and that his father in law might be very busy taking her places.  In the past 12 months has lack of transportation kept you from medical appts or from getting medications? no  In the past 12 months has lack of transportation kept you from meetings, work, or getting things you needed? no   Daily Health Needs: Do you have a working scale at home? no  How do you manage your medications at home? Hasn't been taking medications at home so is worried about adjusting to having to take medications.  Reports he was the caregiver for him mom towards the end of her life however and learned how to manage a pill box at that time- feels pretty confident he can do it.  Do you ever take your medications differently than prescribed? N/a  Do you have issues affording your medications? Is concerned about medication cost outside of insurance- awaiting MDs to decide DC meds to assess how to  help with these costs.  If yes, has this ever prevented you from obtaining medications? n/a  Do you have any concerns with mobility at home? No- normally very independent- some concerns with SOB recently leading up to hospital stay  Do you use any assistive devices at home or have PCS at home? No  Do you have a PCP? no   Are there any additional barriers you see to getting the care you need?  Pt also struggling with stress at this time and would be agreeable to having someone to talk to.  Pt reports that his 61yo son is the main source of his stress- says his son blames him for the separation  and that he is sometimes volatile (hitting his head on the wall etc).  Also reports that he has tried to kill himself and he is very worried that he will have a successful attempt.  States that he had his son committed recently and that was helpful for awhile but he has started to revert back.  States he is on medication but not seeing a mental health provider- don't think he would be agreeable.  Pt feeling overwhelmed and out of control of this situation.  CSW provided active listening and support and will help look into options for pt for counseling despite lack of insurance.  CSW will continue to follow through clinic and assist as needed  Jorge Ny, [facility] Desk#: [phone] Cell#: [phone]

## 2019-10-13 NOTE — Progress Notes (Addendum)
Patient ID: Andrew Hinton, male   DOB: 01/02/1976, 44 y.o.   MRN: [id number]     Advanced Heart Failure Rounding Note  PCP-Cardiologist: No primary care provider on file.   Subjective:    Continues w/ headaches.   No further syncope/ near syncope.   Tele reviewed. Sinus bradycardia, HR 50 bpm. No pauses. No tachyarrhymias.  Rt brachial and rt femoral cath access sites stable.   ____________________ LHC/RHC 10/12/19:  Coronary Findings  Diagnostic Dominance: Right Left Main  No significant coronary disease.  Left Anterior Descending  No significant coronary disease.  Left Circumflex  No significant coronary disease.  Right Coronary Artery  No significant coronary disease.  Intervention  No interventions have been documented. Right Heart  Right Heart Pressures RHC Procedural Findings: Hemodynamics (mmHg) RA mean 3 RV 21/4 PA 24/5, mean 11 PCWP mean 3 LV 90/11 AO 84/53  Oxygen saturations: PA 68% AO 98%  Cardiac Output (Fick) 4.85  Cardiac Index (Fick) 2.3      Objective:   Weight Range: 88.6 kg Body mass index is 26.49 kg/m.   Vital Signs:   Temp:  [97.5 F (36.4 C)-98.9 F (37.2 C)] 97.7 F (36.5 C) (03/02 0356) Pulse Rate:  [45-73] 52 (03/02 0356) Resp:  [7-27] 16 (03/02 0356) BP: (90-127)/(52-86) 101/62 (03/02 0356) SpO2:  [95 %-100 %] 100 % (03/02 0356) Last BM Date: 10/09/19  Weight change: Filed Weights   10/08/19 0811 10/09/19 2118  Weight: 93 kg 88.6 kg    Intake/Output:   Intake/Output Summary (Last 24 hours) at 10/13/2019 0727 Last data filed at 10/13/2019 0600 Gross per 24 hour  Intake 415.73 ml  Output 1500 ml  Net -1084.27 ml      Physical Exam    PHYSICAL EXAM: General:  Well appearing. No respiratory difficulty HEENT: normal Neck: supple. no JVD. Carotids 2+ bilat; no bruits. No lymphadenopathy or thyromegaly appreciated. Cor: PMI nondisplaced. Regular rhythm, slow rate. No rubs, gallops or murmurs. Lungs:  clear Abdomen: soft, nontender, nondistended. No hepatosplenomegaly. No bruits or masses. Good bowel sounds. Extremities: no cyanosis, clubbing, rash, edema Neuro: alert & oriented x 3, cranial nerves grossly intact. moves all 4 extremities w/o difficulty. Affect pleasant.   Telemetry   NSR/sinus brady, 50 bpm. No pauses (personally reviewed)  Labs    CBC Recent Labs    10/12/19 0509 10/12/19 0509 10/12/19 1323 10/12/19 1324  WBC 7.8  --   --   --   HGB 14.2   < > 15.0 15.3  HCT 42.6   < > 44.0 45.0  MCV 95.1  --   --   --   PLT 343  --   --   --    < > = values in this interval not displayed.   Basic Metabolic Panel Recent Labs    10/12/19 0509 10/12/19 1323 10/12/19 1324 10/13/19 0349  NA 137   < > 137 136  K 4.4   < > 4.2 4.3  CL 103  --   --  103  CO2 24  --   --  24  GLUCOSE 106*  --   --  99  BUN 21*  --   --  21*  CREATININE 1.10  --   --  1.12  CALCIUM 9.4  --   --  9.4   < > = values in this interval not displayed.   Liver Function Tests No results for input(s): AST, ALT, ALKPHOS, BILITOT, PROT, ALBUMIN in the last  72 hours. No results for input(s): LIPASE, AMYLASE in the last 72 hours. Cardiac Enzymes No results for input(s): CKTOTAL, CKMB, CKMBINDEX, TROPONINI in the last 72 hours.  BNP: BNP (last 3 results) No results for input(s): BNP in the last 8760 hours.  ProBNP (last 3 results) No results for input(s): PROBNP in the last 8760 hours.   D-Dimer No results for input(s): DDIMER in the last 72 hours. Hemoglobin A1C No results for input(s): HGBA1C in the last 72 hours. Fasting Lipid Panel No results for input(s): CHOL, HDL, LDLCALC, TRIG, CHOLHDL, LDLDIRECT in the last 72 hours. Thyroid Function Tests No results for input(s): TSH, T4TOTAL, T3FREE, THYROIDAB in the last 72 hours.  Invalid input(s): FREET3  Other results:   Imaging    CARDIAC CATHETERIZATION  Result Date: 10/12/2019 1. Low filling pressures. 2. Preserved cardiac  output. 3. No significant coronary disease. Nonischemic cardiomyopathy.     Medications:     Scheduled Medications: . carvedilol  3.125 mg Oral BID WC  . digoxin  0.125 mg Oral Daily  . losartan  12.5 mg Oral BID  . oxyCODONE-acetaminophen  1 tablet Oral Q4H  . sodium chloride flush  3 mL Intravenous Q12H  . spironolactone  25 mg Oral Daily    Infusions: . sodium chloride      PRN Medications: sodium chloride, acetaminophen **OR** acetaminophen, meclizine, morphine injection, ondansetron **OR** ondansetron (ZOFRAN) IV, sodium chloride flush   Assessment/Plan   1. Syncope: Patient had some orthostatic symptoms at home, possible that syncope was an orthostatic event.  However, with markedly low EF and the feeling of his heart racing prior to syncope, I am concerned for VT/VF.  No events on telemetry in the hospital.  - He will need a Lifevest at discharge.  2. Cardiomyopathy: Newly found, nonischemic cardiomyopathy.  The LV is dilated with EF < 20%, the RV appears normal.  Given the LV dilation, I suspect that the cardiomyopathy is not new (probably less likely to be a stress cardiomyopathy related to the subarachnoid hemorrhage). He was a heavy drinker at one point but not recently, possible ETOH cardiomyopathy but would have had to be long-standing and just now coming to light, as he reports no heavy drinking for 2 years.  Cannot rule out viral cardiomyopathy.  He has multiple family members with "heart disease" but he does not have details. Coronary angiography 3/1 showed no significant CAD.  His symptoms currently seem to be due to vertigo and not orthostasis. He is not volume overloaded on exam and filling pressures were low on RHC.   - Continue digoxin 0.125 mg daily. - Continue spironolactone 25 mg daily.  - Continue losartan 12.5 bid.  - Continue Coreg 3.125 mg bid.  - cardiac MRI ordered to look for infiltrative disease.  3. SAH: Small volume SAH post-fall.  Per neurosurgery,  conservative management.   Will need Lifevest at time of discharge. Order has been sent to [facility].   Length of Stay: [address], PA-C  10/13/2019, 7:27 AM  Advanced Heart Failure Team Pager [phone] (M-F; 7a - 4p)  Please contact [facility] for night-coverage after hours (4p -7a ) and weekends on [email or web address]  Patient seen with PA, agree with the above note.   Stable currently.  Awaiting cardiac MRI.  Would keep meds the same for now, SBP soft at times.  Will need Lifevest at discharge and close followup in CHF clinic.   Marca Ancona 10/13/2019 1:05 PM

## 2019-10-13 NOTE — Progress Notes (Signed)
PT Cancellation Note  Patient Details Name: Andrew Hinton MRN: [id number] DOB: 1976-02-12   Cancelled Treatment:    Reason Eval/Treat Not Completed: Patient at procedure or test/unavailable.  Transporter here to take him to MRI.  PT to check back later today or tomorrow.  Thanks,  Corinna Capra, PT, DPT  Acute Rehabilitation [phone] pager [phone] office  @ [email or web address] [place]: [phone]     [name] 10/13/2019, 3:08 PM

## 2019-10-13 NOTE — Progress Notes (Signed)
PROGRESS NOTE    Andrew Hinton  BOF:[id number] DOB: 1975-11-25 DOA: 10/08/2019 PCP: Patient, No Pcp Per     Brief Narrative:  Andrew Hinton is a 44 y.o. male with medical history significant for anxiety asthma depression. Patient was getting ready for work when he opened his basement steps, he was standing at the top of the basement and the next thing he knew, he was waking up at the bottom of the steps.  The last thing he remembers is being at the top of the stairs.  He woke up to his 47 year old son shouting and trying to shake him awake.  He thinks he might have been out for about 10 minutes. He reports he woke up this morning feeling a bit different.  When he was standing at the top of the stairs, he reports a lightheaded feeling, seeing spots like he was going to pass out. He also reports dizziness over the past several months, which worsens with positional change, moving his head. Head CT revealed acute subarachnoid hemorrhage in the right temporal and temporoparietal region, no associated mass-effect or midline shift.  Also nondisplaced fracture left temporal bone. Imaging included left shoulder and chest x-ray which were negative for acute abnormality.  CT abdomen and chest with contrast showed soft tissue hematoma overlying the left hip/gluteal lesion measuring 6.5 cm. Case discussed with neurosurgeon on-call; recommended admission overnight and repeat CT head and observation. Repeat head CT stable. Echo obtained as part of syncope work up and revealed new systolic dysfunction EF < 20%. Cardiology consulted.  Patient underwent heart cath on 3/1 which revealed nonischemic cardiomyopathy.  New events last 24 hours / Subjective: Complains of headache which has been ongoing.  Dizziness improved  Assessment & Plan:   Principal Problem:   Syncope Active Problems:   Subarachnoid hemorrhage (HCC)   AKI (acute kidney injury) (HCC)   Acute systolic CHF (congestive heart failure) (HCC)    Syncope -Echocardiogram as below  -Will need Lifevest at discharge -Case manager to arrange -Meclizine as needed for vertigo.  Will need outpatient PT for vestibular therapy  Subarachnoid hemorrhage -EDP discussed with neurosurgery on-call, Dr. Venetia Maxon, no recommendations for surgical intervention at this time.  Recommended observation and repeat head CT -CT head 2/25 with acute subarachnoid hemorrhage in the right temporal and temporoparietal region without mass-effect or midline shift.  No cervical spine fracture. -Repeat head CT 2/26 with no significant change in small volume subarachnoid hemorrhage in the right temporal lobe.  No midline shift or mass-effect.  No increase in hemorrhage compared to exam  New onset systolic heart failure, nonischemic cardiomyopathy -Echocardiogram revealed EF <20% with global hypokinesis  -Cardiology consulted -Status post heart cath 3/1 which revealed nonischemic cardiomyopathy -Digoxin, losartan, spironolactone, Coreg -Cardiac MRI pending  AKI -Baseline creatinine ~1 -Resolved  Left temporal bone fracture, nondisplaced Left hip/gluteal hematoma -Left shoulder x-ray without fracture or dislocation -Chest x-ray without acute abnormality, CT chest without acute findings in the chest.  No acute osseous abnormality -CTA head and neck with nondisplaced fracture left temporal bone with overlying soft tissue swelling in the scalp -Secondary to fall.  Supportive care -Will schedule oxycodone to stay ahead of pain.  Morphine IV available as needed  Diarrhea -Now resolved.  Discontinued C. difficile test     DVT prophylaxis: SCD Code Status: Full code Family Communication: None at bedside Disposition Plan:  . Patient is from home prior to admission. . Currently in-hospital treatment needed due to further work-up for new onset systolic  heart failure.  MRI pending, LifeVest ordered . Suspect patient will discharge home in 1 to 2 days    Consultants:   Neurosurgery over the phone  Cardiology  Procedures:   Heart cath 3/1  Antimicrobials:  Anti-infectives (From admission, onward)   None       Objective: Vitals:   10/13/19 0356 10/13/19 0855 10/13/19 0950 10/13/19 1143  BP: 101/62 119/66  122/81  Pulse: (!) 52 61 73 86  Resp: 16 18  18   Temp: 97.7 F (36.5 C) 98 F (36.7 C)  97.9 F (36.6 C)  TempSrc: Oral Oral  Oral  SpO2: 100% 97%  99%  Weight:      Height:        Intake/Output Summary (Last 24 hours) at 10/13/2019 1145 Last data filed at 10/13/2019 0951 Gross per 24 hour  Intake 406.75 ml  Output 1100 ml  Net -693.25 ml   Filed Weights   10/08/19 0811 10/09/19 2118  Weight: 93 kg 88.6 kg    Examination: General exam: Appears calm and comfortable  Respiratory system: Clear to auscultation. Respiratory effort normal. Cardiovascular system: S1 & S2 heard, RRR. No pedal edema. Gastrointestinal system: Abdomen is nondistended, soft and nontender. Normal bowel sounds heard. Central nervous system: Alert and oriented. Non focal exam. Speech clear  Extremities: Symmetric in appearance bilaterally  Skin: No rashes, lesions or ulcers on exposed skin  Psychiatry: Judgement and insight appear stable. Mood & affect appropriate.   Data Reviewed: I have personally reviewed following labs and imaging studies  CBC: Recent Labs  Lab 10/08/19 0908 10/09/19 0311 10/12/19 0509 10/12/19 1323 10/12/19 1324  WBC 7.6 8.6 7.8  --   --   NEUTROABS 5.4  --   --   --   --   HGB 14.0 13.6 14.2 15.0 15.3  HCT 42.3 41.0 42.6 44.0 45.0  MCV 96.4 96.7 95.1  --   --   PLT 332 329 343  --   --    Basic Metabolic Panel: Recent Labs  Lab 10/08/19 0908 10/08/19 1537 10/09/19 0311 10/09/19 0311 10/10/19 0257 10/10/19 0257 10/11/19 0523 10/12/19 0509 10/12/19 1323 10/12/19 1324 10/13/19 0349  NA   < >  --  138   < > 137   < > 134* 137 137 137 136  K   < >  --  4.0   < > 4.3   < > 4.1 4.4 4.2 4.2 4.3  CL    < >  --  101  --  101  --  100 103  --   --  103  CO2   < >  --  27  --  27  --  24 24  --   --  24  GLUCOSE   < >  --  97  --  111*  --  106* 106*  --   --  99  BUN   < >  --  18  --  16  --  15 21*  --   --  21*  CREATININE   < >  --  1.46*  --  1.32*  --  1.16 1.10  --   --  1.12  CALCIUM   < >  --  8.9  --  9.1  --  9.1 9.4  --   --  9.4  MG  --  2.2  --   --   --   --   --   --   --   --   --    < > =  values in this interval not displayed.   GFR: Estimated Creatinine Clearance: 94.3 mL/min (by C-G formula based on SCr of 1.12 mg/dL). Liver Function Tests: Recent Labs  Lab 10/08/19 0908 10/09/19 0311  AST 50* 29  ALT 86* 65*  ALKPHOS 51 52  BILITOT 0.6 0.8  PROT 7.5 6.5  ALBUMIN 4.1 3.5   No results for input(s): LIPASE, AMYLASE in the last 168 hours. No results for input(s): AMMONIA in the last 168 hours. Coagulation Profile: No results for input(s): INR, PROTIME in the last 168 hours. Cardiac Enzymes: No results for input(s): CKTOTAL, CKMB, CKMBINDEX, TROPONINI in the last 168 hours. BNP (last 3 results) No results for input(s): PROBNP in the last 8760 hours. HbA1C: No results for input(s): HGBA1C in the last 72 hours. CBG: Recent Labs  Lab 10/12/19 1215 10/12/19 1650  GLUCAP 102* 72   Lipid Profile: No results for input(s): CHOL, HDL, LDLCALC, TRIG, CHOLHDL, LDLDIRECT in the last 72 hours. Thyroid Function Tests: No results for input(s): TSH, T4TOTAL, FREET4, T3FREE, THYROIDAB in the last 72 hours. Anemia Panel: No results for input(s): VITAMINB12, FOLATE, FERRITIN, TIBC, IRON, RETICCTPCT in the last 72 hours. Sepsis Labs: No results for input(s): PROCALCITON, LATICACIDVEN in the last 168 hours.  Recent Results (from the past 240 hour(s))  Respiratory Panel by RT PCR (Flu A&B, Covid) - Nasopharyngeal Swab     Status: None   Collection Time: 10/08/19 12:28 PM   Specimen: Nasopharyngeal Swab  Result Value Ref Range Status   SARS Coronavirus 2 by RT PCR  NEGATIVE NEGATIVE Final    Comment: (NOTE) SARS-CoV-2 target nucleic acids are NOT DETECTED. The SARS-CoV-2 RNA is generally detectable in upper respiratoy specimens during the acute phase of infection. The lowest concentration of SARS-CoV-2 viral copies this assay can detect is 131 copies/mL. A negative result does not preclude SARS-Cov-2 infection and should not be used as the sole basis for treatment or other patient management decisions. A negative result may occur with  improper specimen collection/handling, submission of specimen other than nasopharyngeal swab, presence of viral mutation(s) within the areas targeted by this assay, and inadequate number of viral copies (<131 copies/mL). A negative result must be combined with clinical observations, patient history, and epidemiological information. The expected result is Negative. Fact Sheet for Patients:  https://www.moore.com/ Fact Sheet for Healthcare Providers:  https://www.young.biz/ This test is not yet ap proved or cleared by the [place] FDA and  has been authorized for detection and/or diagnosis of SARS-CoV-2 by FDA under an Emergency Use Authorization (EUA). This EUA will remain  in effect (meaning this test can be used) for the duration of the COVID-19 declaration under Section 564(b)(1) of the Act, 21 U.S.C. section 360bbb-3(b)(1), unless the authorization is terminated or revoked sooner.    Influenza A by PCR NEGATIVE NEGATIVE Final   Influenza B by PCR NEGATIVE NEGATIVE Final    Comment: (NOTE) The Xpert Xpress SARS-CoV-2/FLU/RSV assay is intended as an aid in  the diagnosis of influenza from Nasopharyngeal swab specimens and  should not be used as a sole basis for treatment. Nasal washings and  aspirates are unacceptable for Xpert Xpress SARS-CoV-2/FLU/RSV  testing. Fact Sheet for Patients: https://www.moore.com/ Fact Sheet for Healthcare Providers:  https://www.young.biz/ This test is not yet approved or cleared by the [place] FDA and  has been authorized for detection and/or diagnosis of SARS-CoV-2 by  FDA under an Emergency Use Authorization (EUA). This EUA will remain  in effect (meaning this test can be  used) for the duration of the  Covid-19 declaration under Section 564(b)(1) of the Act, 21  U.S.C. section 360bbb-3(b)(1), unless the authorization is  terminated or revoked. Performed at [facility], [address], [place], [place] [address]   MRSA PCR Screening     Status: None   Collection Time: 10/10/19  9:58 AM   Specimen: Nasal Mucosa; Nasopharyngeal  Result Value Ref Range Status   MRSA by PCR NEGATIVE NEGATIVE Final    Comment:        The GeneXpert MRSA Assay (FDA approved for NASAL specimens only), is one component of a comprehensive MRSA colonization surveillance program. It is not intended to diagnose MRSA infection nor to guide or monitor treatment for MRSA infections. Performed at [facility], [address] [place], [place] [address]       Radiology Studies: CARDIAC CATHETERIZATION  Result Date: 10/12/2019 1. Low filling pressures. 2. Preserved cardiac output. 3. No significant coronary disease. Nonischemic cardiomyopathy.      Scheduled Meds: . carvedilol  3.125 mg Oral BID WC  . digoxin  0.125 mg Oral Daily  . losartan  12.5 mg Oral BID  . oxyCODONE-acetaminophen  1 tablet Oral Q4H  . sodium chloride flush  3 mL Intravenous Q12H  . spironolactone  25 mg Oral Daily   Continuous Infusions: . sodium chloride       LOS: 5 days      Time spent: 25 minutes   Noralee Stain, DO [facility] 10/13/2019, 11:45 AM   Available via Epic secure chat 7am-7pm After these hours, please refer to coverage provider listed on [email or web address]

## 2019-10-13 NOTE — Progress Notes (Signed)
Occupational Therapy Treatment Patient Details Name: Andrew Hinton MRN: [id number] DOB: 05/28/1976 Today's Date: 10/13/2019    History of present illness Pt is a 44 y.o. M with significant PMH of anxiety, asthma, depression. Presents following a fall down 15 steps to his basement; possible syncopal episode. CT showing small volume SAH right temporal and temporoparietal area and nondisplaced fracture left temporal bone. Pt also with left hip/gluteal region hematoma and L shoulder pain.    OT comments  Pt making steady progress towards OT goals this session. Pt presents with photophobia, HA, and blurry vision impacting ability to engage in ADLs. Overall, pt requires supervision - min guard assist for room level functional mobility with no AD. Pt completed standing grooming tasks with sup. Administered pillbox assessment under supervision of OT/L. Pt passed assessment noted to have good organizational skills and able to divide attention by completing assessment and conversing with therapist without showing signs of frustration or agitation. Agree with DC plan below, will follow for OT needs.    Follow Up Recommendations  No OT follow up    Equipment Recommendations  None recommended by OT    Recommendations for Other Services      Precautions / Restrictions Precautions Precautions: Fall;Other (comment) Precaution Comments: light sensitive Restrictions Weight Bearing Restrictions: No       Mobility Bed Mobility Overal bed mobility: Independent                Transfers Overall transfer level: Needs assistance Equipment used: None Transfers: Sit to/from Stand Sit to Stand: Supervision         General transfer comment: sup for safety    Balance Overall balance assessment: Needs assistance Sitting-balance support: Feet supported;No upper extremity supported Sitting balance-Leahy Scale: Good     Standing balance support: No upper extremity supported Standing  balance-Leahy Scale: Good                             ADL either performed or assessed with clinical judgement   ADL Overall ADL's : Needs assistance/impaired     Grooming: Wash/dry face;Oral care;Brushing hair;Standing;Supervision/safety                   Toilet Transfer: Min guard;Ambulation Toilet Transfer Details (indicate cue type and reason): minguard mostly to keep gown closed         Functional mobility during ADLs: Min guard General ADL Comments: pt presents with photophobia, HA, and blurry vision impacting ability to engage in ADLs.     Vision Baseline Vision/History: Wears glasses Wears Glasses: At all times Patient Visual Report: Blurring of vision;Other (comment)(L eye)     Perception     Praxis      Cognition Arousal/Alertness: Awake/alert Behavior During Therapy: WFL for tasks assessed/performed Overall Cognitive Status: Within Functional Limits for tasks assessed                                 General Comments: complete pillbox assessment under supervision of OTR. Pt passed assessment, but reports increased stress, spoke with SW about providing pt with resources to manage stress. Pt would benefit from coping strategies/ relaxation techniques next session.        Exercises     Shoulder Instructions       General Comments      Pertinent Vitals/ Pain       Pain Assessment: Faces  Faces Pain Scale: Hurts little more Pain Location: HA Pain Descriptors / Indicators: Headache Pain Intervention(s): Monitored during session  Home Living                                          Prior Functioning/Environment              Frequency  Min 2X/week        Progress Toward Goals  OT Goals(current goals can now be found in the care plan section)  Progress towards OT goals: Progressing toward goals  Acute Rehab OT Goals Patient Stated Goal: to get back to work and decrease HA.  OT Goal  Formulation: With patient Time For Goal Achievement: 10/24/19 Potential to Achieve Goals: Good  Plan Discharge plan remains appropriate    Co-evaluation                 AM-PAC OT "6 Clicks" Daily Activity     Outcome Measure   Help from another person eating meals?: None Help from another person taking care of personal grooming?: None Help from another person toileting, which includes using toliet, bedpan, or urinal?: None Help from another person bathing (including washing, rinsing, drying)?: A Little Help from another person to put on and taking off regular upper body clothing?: None Help from another person to put on and taking off regular lower body clothing?: None 6 Click Score: 23    End of Session    OT Visit Diagnosis: Unsteadiness on feet (R26.81);Pain;Dizziness and giddiness (R42);Other symptoms and signs involving the nervous system (R29.898)   Activity Tolerance Patient tolerated treatment well   Patient Left in chair;with call bell/phone within reach   Nurse Communication          Time: 4403-4742 OT Time Calculation (min): 38 min  Charges: OT General Charges $OT Visit: 1 Visit OT Treatments $Self Care/Home Management : 38-52 mins  Lanier Clam., COTA/L Acute Rehabilitation Services [phone]    Ihor Gully 10/13/2019, 2:53 PM

## 2019-10-13 NOTE — Progress Notes (Addendum)
Information requested from [facility] Vest faxed to: [phone]. Awaiting to hear about delivery of the vest.  TOC following.  Addendum: 1531: Life vest to be delivered to the room late today or in the am.

## 2019-10-14 LAB — BASIC METABOLIC PANEL
Anion gap: 10 (ref 5–15)
BUN: 21 mg/dL — ABNORMAL HIGH (ref 6–20)
CO2: 27 mmol/L (ref 22–32)
Calcium: 9.6 mg/dL (ref 8.9–10.3)
Chloride: 97 mmol/L — ABNORMAL LOW (ref 98–111)
Creatinine, Ser: 1.2 mg/dL (ref 0.61–1.24)
GFR calc Af Amer: >60 mL/min (ref >60)
GFR calc non Af Amer: >60 mL/min (ref >60)
Glucose, Bld: 106 mg/dL — ABNORMAL HIGH (ref 70–99)
Potassium: 4.4 mmol/L (ref 3.5–5.1)
Sodium: 134 mmol/L — ABNORMAL LOW (ref 135–145)

## 2019-10-14 MED ORDER — DIGOXIN 125 MCG PO TABS: 0.1250 mg | ORAL_TABLET | Freq: Every day | ORAL | 0 refills | Status: DC

## 2019-10-14 MED ORDER — SPIRONOLACTONE 25 MG PO TABS: 25.0000 mg | ORAL_TABLET | Freq: Every day | ORAL | 0 refills | Status: DC

## 2019-10-14 MED ORDER — MECLIZINE HCL 12.5 MG PO TABS: 12.5000 mg | ORAL_TABLET | Freq: Three times a day (TID) | ORAL | 0 refills | Status: DC | PRN

## 2019-10-14 MED ORDER — LOSARTAN POTASSIUM 25 MG PO TABS: 12.5000 mg | ORAL_TABLET | Freq: Two times a day (BID) | ORAL | 0 refills | Status: DC

## 2019-10-14 MED ORDER — OXYCODONE-ACETAMINOPHEN 5-325 MG PO TABS: 1.0000 | ORAL_TABLET | ORAL | 0 refills | Status: DC | PRN

## 2019-10-14 MED ORDER — CARVEDILOL 3.125 MG PO TABS: 3.1250 mg | ORAL_TABLET | Freq: Two times a day (BID) | ORAL | 0 refills | Status: DC

## 2019-10-14 MED FILL — SPIRONOLACTONE 25 MG TABLET: 25 | 30 days supply | Qty: 30 | Fill #0

## 2019-10-14 MED FILL — DIGOXIN 0.125 MG TABLET: 125 | 30 days supply | Qty: 30 | Fill #0

## 2019-10-14 MED FILL — CARVEDILOL 3.125 MG TABLET: 3.125 | 30 days supply | Qty: 60 | Fill #0

## 2019-10-14 MED FILL — MECLIZINE 12.5 MG CAPLET: 12.5 | 10 days supply | Qty: 30 | Fill #0

## 2019-10-14 MED FILL — LOSARTAN POTASSIUM 25 MG TA: 25 | 30 days supply | Qty: 30 | Fill #0

## 2019-10-14 MED FILL — OXYCODONE-APAP 5-325MG: 5-325 | 5 days supply | Qty: 60 | Fill #0

## 2019-10-14 NOTE — Discharge Summary (Signed)
Physician Discharge Summary  Dasan Hardman ZOX:[id number] DOB: 1976/06/30 DOA: 10/08/2019  PCP: Patient, No Pcp Per  Admit date: 10/08/2019 Discharge date: 10/14/2019  Admitted From: Home Disposition:  Home  Recommendations for Outpatient Follow-up:  1. Follow up with PCP. Resources to establish with PCP provided on AVS.  2. Follow up with [facility] 10/22/2019  Discharge Condition: Stable CODE STATUS: Full  Diet recommendation: Heart healthy   Brief/Interim Summary: Glenwood Nelsonis a 44 y.o.malewith medical history significant foranxiety asthma depression. Patient was getting ready for work when he opened his basement steps, he was standing at the top of the basement and the next thing he knew, he was waking up at the bottom of the steps. The last thing he remembers is being at the top of the stairs. He woke up to his 43 year old son shoutingand trying to shake him awake. He thinks he might have been out for about 10 minutes. He reports he woke up this morning feeling a bit different. When he was standing at the top of the stairs, he reports a lightheaded feeling, seeing spots like he was going to pass out. He also reports dizziness over the past several months, which worsens with positional change, moving his head. Head CT revealed acute subarachnoid hemorrhage in the right temporal and temporoparietal region,no associated mass-effect or midline shift. Also nondisplaced fracture left temporal bone. Imaging included left shoulder and chest x-ray which were negative for acute abnormality.CT abdomen and chest with contrast showed soft tissue hematoma overlying the left hip/gluteal lesion measuring 6.5 cm. Case discussed with neurosurgeon on-call; recommended admission overnight and repeat CT head and observation. Repeat head CT stable. Echo obtained as part of syncope work up and revealed new systolic dysfunction EF < 20%. Cardiology consulted.  Patient underwent heart  cath on 3/1 which revealed nonischemic cardiomyopathy. Cardiac MRI without evidence for prior MI, infiltrative disease, myocarditis   Discharge Diagnoses:  Principal Problem:   Syncope Active Problems:   Subarachnoid hemorrhage (HCC)   AKI (acute kidney injury) (HCC)   Acute systolic CHF (congestive heart failure) (HCC)   Syncope -Echocardiogram as below  -Lifevest at discharge  -Meclizine as needed for vertigo.  Will need outpatient PT for vestibular therapy  Subarachnoid hemorrhage -EDP discussed with neurosurgery on-call, Dr. Venetia Maxon, no recommendations for surgical intervention at this time.  Recommended observation and repeat head CT -CT head 2/25 with acute subarachnoid hemorrhage in the right temporal and temporoparietal region without mass-effect or midline shift.  No cervical spine fracture. -Repeat head CT 2/26 with no significant change in small volume subarachnoid hemorrhage in the right temporal lobe.  No midline shift or mass-effect.  No increase in hemorrhage compared to exam  New onset systolic heart failure, nonischemic cardiomyopathy -Echocardiogram revealed EF <20% with global hypokinesis  -Cardiology consulted -Status post heart cath 3/1 which revealed nonischemic cardiomyopathy -Digoxin, losartan, spironolactone, Coreg -Cardiac MRI without evidence for prior MI, infiltrative disease, myocarditis   AKI -Baseline creatinine ~1 -Resolved  Left temporal bone fracture, nondisplaced Left hip/gluteal hematoma -Left shoulder x-ray without fracture or dislocation -Chest x-ray without acute abnormality, CT chest without acute findings in the chest.  No acute osseous abnormality -CTA head and neck with nondisplaced fracture left temporal bone with overlying soft tissue swelling in the scalp -Secondary to fall.  Supportive care  Diarrhea -Now resolved.  Discontinued C. difficile test   Discharge Instructions  Discharge Instructions    (HEART FAILURE PATIENTS)  Call MD:  Anytime you have any of the  following symptoms: 1) 3 pound weight gain in 24 hours or 5 pounds in 1 week 2) shortness of breath, with or without a dry hacking cough 3) swelling in the hands, feet or stomach 4) if you have to sleep on extra pillows at night in order to breathe.   Complete by: As directed    Ambulatory referral to Physical Therapy   Complete by: As directed    Call MD for:  difficulty breathing, headache or visual disturbances   Complete by: As directed    Call MD for:  extreme fatigue   Complete by: As directed    Call MD for:  persistant dizziness or light-headedness   Complete by: As directed    Call MD for:  persistant nausea and vomiting   Complete by: As directed    Call MD for:  severe uncontrolled pain   Complete by: As directed    Call MD for:  temperature >100.4   Complete by: As directed    Diet - low sodium heart healthy   Complete by: As directed    Discharge instructions   Complete by: As directed    You were cared for by a hospitalist during your hospital stay. If you have any questions about your discharge medications or the care you received while you were in the hospital after you are discharged, you can call the unit and ask to speak with the hospitalist on call if the hospitalist that took care of you is not available. Once you are discharged, your primary care physician will handle any further medical issues. Please note that NO REFILLS for any discharge medications will be authorized once you are discharged, as it is imperative that you return to your primary care physician (or establish a relationship with a primary care physician if you do not have one) for your aftercare needs so that they can reassess your need for medications and monitor your lab values.   Increase activity slowly   Complete by: As directed      Allergies as of 10/14/2019   No Known Allergies     Medication List    STOP taking these medications   dicyclomine 20 MG  tablet Commonly known as: BENTYL   Motrin IB 200 MG tablet Generic drug: ibuprofen   NyQuil Severe Cold/Flu 5-6.25-10-325 MG/15ML Liqd Generic drug: Phenyleph-Doxylamine-DM-APAP   ondansetron 4 MG disintegrating tablet Commonly known as: Zofran ODT   promethazine 25 MG tablet Commonly known as: PHENERGAN     TAKE these medications   carvedilol 3.125 MG tablet Commonly known as: COREG Take 1 tablet (3.125 mg total) by mouth 2 (two) times daily with a meal.   digoxin 0.125 MG tablet Commonly known as: LANOXIN Take 1 tablet (0.125 mg total) by mouth daily. Start taking on: October 15, 2019   losartan 25 MG tablet Commonly known as: COZAAR Take 0.5 tablets (12.5 mg total) by mouth in the morning and at bedtime.   meclizine 12.5 MG tablet Commonly known as: ANTIVERT Take 1 tablet (12.5 mg total) by mouth 3 (three) times daily as needed for dizziness.   oxyCODONE-acetaminophen 5-325 MG tablet Commonly known as: PERCOCET/ROXICET Take 1-2 tablets by mouth every 4 (four) hours as needed for severe pain.   spironolactone 25 MG tablet Commonly known as: ALDACTONE Take 1 tablet (25 mg total) by mouth daily. Start taking on: October 15, 2019      Follow-up Information    [facility] Follow up.  Why: Please call to see if able to get an appointment Contact information: [address] [place] [address] [phone]        [facility] [place] Follow up.   Why: Use this PCP or [facility] for your PCP Contact information: [address] [phone]       [facility] Follow up on 10/22/2019.   Specialty: Cardiology Why: at 1:30  Contact information: [address] [id number] mc [place] [address] [phone]       [facility] Follow up.   Specialty: Rehabilitation Why: Facility will call you Contact information: [address] [id number] [place] [address] [phone]         No Known Allergies  Consultations:  Advanced heart failure    Procedures/Studies: CT ANGIO HEAD W OR WO CONTRAST  Result Date: 10/08/2019 CLINICAL DATA:  Larey Seat down steps.  Subarachnoid hemorrhage. EXAM: CT ANGIOGRAPHY HEAD AND NECK TECHNIQUE: Multidetector CT imaging of the head and neck was performed using the standard protocol during bolus administration of intravenous contrast. Multiplanar CT image reconstructions and MIPs were obtained to evaluate the vascular anatomy. Carotid stenosis measurements (when applicable) are obtained utilizing NASCET criteria, using the distal internal carotid diameter as the denominator. CONTRAST:  84mL OMNIPAQUE IOHEXOL 350 MG/ML SOLN COMPARISON:  CT head earlier today FINDINGS: CT HEAD FINDINGS Brain: Small volume subarachnoid hemorrhage on the right is stable. No subdural hemorrhage. No midline shift. Basilar cisterns and suprasellar cistern negative for hemorrhage. Ventricle size normal. Negative for acute infarct or mass. Vascular: Negative for hyperdense vessel Skull: Nondepressed fracture left temporal bone squamosal portion, unchanged from the prior study. Overlying soft tissue swelling in the left parietal scalp. Sinuses: Extensive mucosal edema throughout the paranasal sinuses unchanged. Negative orbit. Orbits: Negative orbit Review of the MIP images confirms the above findings NECK FINDINGS Aortic arch: Normal aortic arch. Bovine branching pattern. Proximal great vessels normal. Right carotid system: Normal right carotid. Negative for atherosclerotic disease or dissection. Left carotid system: Normal left carotid. Negative for atherosclerotic disease or dissection Vertebral arteries: Both vertebral arteries are normal and widely patent to the basilar Skeleton: Negative for cervical spine fracture. Poor dentition with multiple caries Other neck: Negative for mass or soft tissue  swelling in the neck. Upper chest: Lung apices clear bilaterally. Review of the MIP images confirms the above findings CTA HEAD FINDINGS Anterior circulation: Cavernous carotid widely patent and normal bilaterally. No stenosis or atherosclerotic disease. Negative for aneurysm. Anterior and middle cerebral arteries normal bilaterally without stenosis or aneurysm. Posterior circulation: Both vertebral arteries patent to the basilar. PICA patent bilaterally. Basilar widely patent. Superior cerebellar and posterior cerebral arteries widely patent bilaterally. Negative for aneurysm. Venous sinuses: Normal venous enhancement. Anatomic variants: None Review of the MIP images confirms the above findings IMPRESSION: 1. Small volume subarachnoid hemorrhage on the right is stable. No new hemorrhage identified. 2. Nondisplaced fracture left temporal bone with overlying soft tissue swelling in the scalp. Findings suggest significant head trauma with contrecoup subarachnoid hemorrhage on the right. 3. Negative for cerebral aneurysm 4. No significant intracranial or extracranial arterial stenosis. 5. These results were called by telephone at the time of interpretation on 10/08/2019 at 1:40 pm to provider JOSEPH ZAMMIT , who verbally acknowledged these results. Electronically Signed   By: Marlan Palau M.D.   On: 10/08/2019 13:42   DG Chest 1 View  Result Date: 10/08/2019 CLINICAL DATA:  Fall EXAM: CHEST  1 VIEW COMPARISON:  03/27/2018 FINDINGS: The heart size and mediastinal contours are within normal limits. Both lungs are clear. The visualized skeletal structures are unremarkable. IMPRESSION: No acute abnormality of the lungs in AP portable projection. Electronically Signed   By: Lauralyn Primes M.D.   On: 10/08/2019 10:13   CT HEAD WO CONTRAST  Result Date: 10/09/2019 CLINICAL DATA:  Intracranial hemorrhage. Trauma. EXAM: CT HEAD WITHOUT CONTRAST TECHNIQUE: Contiguous axial images were obtained from the base of the skull  through the vertex without intravenous contrast. COMPARISON:  CT 10/08/2019 FINDINGS: Brain: Again demonstrated small volume subarachnoid hemorrhage in the RIGHT temporal lobe. No increased hemorrhage compared to exam 1 day prior. No midline shift or mass effect. No intraventricular hemorrhage. Vascular: No hyperdense vessel or unexpected calcification. Skull: Subtle nondisplaced temporal bone fracture (squamous portion) on the LEFT again noted (image 38/4 and image 22/4). Associated scalp hematoma over the LEFT parietal bone stable thickness at 6 mm. Sinuses/Orbits: Dense opacification of the paranasal sinuses consistent chronic sinusitis. Orbits normal Other: IMPRESSION: 1. No significant change in small volume subarachnoid hemorrhage in the RIGHT temporal lobe. No midline shift or mass effect. 2. No increased hemorrhage compared to exam 1 day prior. 3. Stable nondisplaced LEFT temporal bone fracture. Electronically Signed   By: Genevive Bi M.D.   On: 10/09/2019 09:13   CT Head Wo Contrast  Result Date: 10/08/2019 CLINICAL DATA:  Larey Seat down steps. Loss of consciousness. EXAM: CT HEAD WITHOUT CONTRAST CT CERVICAL SPINE WITHOUT CONTRAST TECHNIQUE: Multidetector CT imaging of the head and cervical spine was performed following the standard protocol without intravenous contrast. Multiplanar CT image reconstructions of the cervical spine were also generated. COMPARISON:  None. FINDINGS: CT HEAD FINDINGS Brain: Acute subarachnoid hemorrhage is seen in the right temporal lobe and temporoparietal region, well demonstrated on axial images 13, 14 of series 2 and coronal images 36 through 39 of series 4. No midline shift. No hydrocephalus. No mass lesion evident. Vascular: No hyperdense vessel or unexpected calcification. Skull: No evidence for fracture. No worrisome lytic or sclerotic lesion. Chronic deformity noted in the mandibular condyles bilaterally with bilateral TMJ subluxation. Sinuses/Orbits: Near complete  opacification noted in the bilateral frontal sinuses, bilateral ethmoid air cells, and right maxillary sinus. Extensive chronic mucosal disease noted in the left maxillary sinus. Mastoid air cells are clear bilaterally. Other: None. CT CERVICAL SPINE FINDINGS Alignment: Normal. Skull base and vertebrae: No acute fracture. No primary bone lesion or focal pathologic process. Soft tissues and spinal canal: No prevertebral fluid or swelling. No visible canal hematoma. Disc levels:  Preserved throughout. Upper chest: Unremarkable. Other: None. IMPRESSION: 1. Acute subarachnoid hemorrhage identified in the right temporal and temporoparietal region. No associated mass effect or midline shift. 2. No cervical spine fracture. 3. Chronic paranasal sinusitis. 4. Chronic deformity of the mandibular condyles bilaterally, potentially posttraumatic or degenerative with subluxation of the bilateral temporomandibular joints. Critical Value/emergent results were called by telephone at the time of interpretation on 10/08/2019 at 10:52 am to provider [facility] ZAMMIT , who verbally acknowledged these results. Electronically Signed   By: Kennith Center M.D.   On: 10/08/2019 10:53   CT Angio Neck W and/or Wo Contrast  Result Date: 10/08/2019 CLINICAL DATA:  Larey Seat down steps.  Subarachnoid hemorrhage. EXAM: CT ANGIOGRAPHY HEAD AND NECK TECHNIQUE: Multidetector CT imaging of the head and neck was performed using the standard protocol during bolus administration of intravenous contrast. Multiplanar CT image reconstructions and MIPs were obtained to  evaluate the vascular anatomy. Carotid stenosis measurements (when applicable) are obtained utilizing NASCET criteria, using the distal internal carotid diameter as the denominator. CONTRAST:  46mL OMNIPAQUE IOHEXOL 350 MG/ML SOLN COMPARISON:  CT head earlier today FINDINGS: CT HEAD FINDINGS Brain: Small volume subarachnoid hemorrhage on the right is stable. No subdural hemorrhage. No midline shift.  Basilar cisterns and suprasellar cistern negative for hemorrhage. Ventricle size normal. Negative for acute infarct or mass. Vascular: Negative for hyperdense vessel Skull: Nondepressed fracture left temporal bone squamosal portion, unchanged from the prior study. Overlying soft tissue swelling in the left parietal scalp. Sinuses: Extensive mucosal edema throughout the paranasal sinuses unchanged. Negative orbit. Orbits: Negative orbit Review of the MIP images confirms the above findings NECK FINDINGS Aortic arch: Normal aortic arch. Bovine branching pattern. Proximal great vessels normal. Right carotid system: Normal right carotid. Negative for atherosclerotic disease or dissection. Left carotid system: Normal left carotid. Negative for atherosclerotic disease or dissection Vertebral arteries: Both vertebral arteries are normal and widely patent to the basilar Skeleton: Negative for cervical spine fracture. Poor dentition with multiple caries Other neck: Negative for mass or soft tissue swelling in the neck. Upper chest: Lung apices clear bilaterally. Review of the MIP images confirms the above findings CTA HEAD FINDINGS Anterior circulation: Cavernous carotid widely patent and normal bilaterally. No stenosis or atherosclerotic disease. Negative for aneurysm. Anterior and middle cerebral arteries normal bilaterally without stenosis or aneurysm. Posterior circulation: Both vertebral arteries patent to the basilar. PICA patent bilaterally. Basilar widely patent. Superior cerebellar and posterior cerebral arteries widely patent bilaterally. Negative for aneurysm. Venous sinuses: Normal venous enhancement. Anatomic variants: None Review of the MIP images confirms the above findings IMPRESSION: 1. Small volume subarachnoid hemorrhage on the right is stable. No new hemorrhage identified. 2. Nondisplaced fracture left temporal bone with overlying soft tissue swelling in the scalp. Findings suggest significant head trauma  with contrecoup subarachnoid hemorrhage on the right. 3. Negative for cerebral aneurysm 4. No significant intracranial or extracranial arterial stenosis. 5. These results were called by telephone at the time of interpretation on 10/08/2019 at 1:40 pm to provider JOSEPH ZAMMIT , who verbally acknowledged these results. Electronically Signed   By: Franchot Gallo M.D.   On: 10/08/2019 13:42   CT Chest W Contrast  Result Date: 10/08/2019 CLINICAL DATA:  Fall, left-sided rib pain. History of appendectomy EXAM: CT CHEST, ABDOMEN, AND PELVIS WITH CONTRAST TECHNIQUE: Multidetector CT imaging of the chest, abdomen and pelvis was performed following the standard protocol during bolus administration of intravenous contrast. CONTRAST:  127mL OMNIPAQUE IOHEXOL 300 MG/ML  SOLN COMPARISON:  CT chest 09/21/2017, CT abdomen-pelvis 08/25/2019 FINDINGS: CT CHEST FINDINGS Cardiovascular: Normal heart size. No pericardial effusion. Thoracic aorta is normal in course and caliber. Pulmonary trunk is nondilated. No significant vascular findings identified. Mediastinum/Nodes: No enlarged mediastinal, hilar, or axillary lymph nodes. Thyroid gland, trachea, and esophagus demonstrate no significant findings. Lungs/Pleura: Lungs are clear. No pleural effusion or pneumothorax. Musculoskeletal: Healed fractures of the posterior right eighth, ninth, and tenth ribs. Normal spinal alignment. Vertebral body heights are maintained. No acute osseous findings. No chest wall hematoma or other abnormality. CT ABDOMEN PELVIS FINDINGS Hepatobiliary: Diffusely decreased attenuation of the hepatic parenchyma compatible with hepatic steatosis. No focal hepatic injury or pericapsular collection. Gallbladder unremarkable. No gallstone or biliary dilatation. Pancreas: Unremarkable. No pancreatic ductal dilatation or surrounding inflammatory changes. Spleen: No splenic injury or perisplenic hematoma. Adrenals/Urinary Tract: No adrenal hemorrhage or renal injury  identified. Bladder is unremarkable. Stomach/Bowel: Stomach  is within normal limits. Appendix surgically absent. Scattered colonic diverticulosis. No evidence of bowel wall thickening, distention, or inflammatory changes. Vascular/Lymphatic: No significant vascular findings are present. No enlarged abdominal or pelvic lymph nodes. Reproductive: Prostate is unremarkable. Other: Small fat containing umbilical hernia. No abdominopelvic ascites. Musculoskeletal: Induration within the subcutaneous tissues overlying the left hip and gluteal region with focal hyperdense fluid collection measuring 5.6 x 1.7 x 6.5 cm (volume = 32 cm^3) suggestive of a hematoma (series 2, image 117). Pelvic bony ring is intact without fracture. Bilateral hip joints intact without fracture or dislocation. Lumbar vertebral body heights are maintained without fracture or static listhesis. Intervertebral disc height loss of L5-S1. IMPRESSION: 1. Soft tissue hematoma overlying the left hip/gluteal region measuring up to 6.5 cm. 2. No acute osseous abnormality. 3. No acute findings within the chest. No solid organ or visceral injury within the abdomen or pelvis. 4. Hepatic steatosis. Electronically Signed   By: Nicholas  Plundo D.O.   On: 10/08/2019 10:59   CT Cervical Spine Wo Contrast  Result Date: 10/08/2019 CLINICAL DATA:  Fell down steps. Loss of consciousness. EXAM: CT HEAD WITHOUT CONTRAST CT CERVICAL SPINE WITHOUT CONTRAST TECHNIQUE: Multidetector CT imaging of the head and cervical spine was performed following the standard protocol without intravenous contrast. Multiplanar CT image reconstructions of the cervical spine were also generated. COMPARISON:  None. FINDINGS: CT HEAD FINDINGS Brain: Acute subarachnoid hemorrhage is seen in the right temporal lobe and temporoparietal region, well demonstrated on axial images 13, 14 of series 2 and coronal images 36 through 39 of series 4. No midline shift. No hydrocephalus. No mass lesion  evident. Vascular: No hyperdense vessel or unexpected calcification. Skull: No evidence for fracture. No worrisome lytic or sclerotic lesion. Chronic deformity noted in the mandibular condyles bilaterally with bilateral TMJ subluxation. Sinuses/Orbits: Near complete opacification noted in the bilateral frontal sinuses, bilateral ethmoid air cells, and right maxillary sinus. Extensive chronic mucosal disease noted in the left maxillary sinus. Mastoid air cells are clear bilaterally. Other: None. CT CERVICAL SPINE FINDINGS Alignment: Normal. Skull base and vertebrae: No acute fracture. No primary bone lesion or focal pathologic process. Soft tissues and spinal canal: No prevertebral fluid or swelling. No visible canal hematoma. Disc levels:  Preserved throughout. Upper chest: Unremarkable. Other: None. IMPRESSION: 1. Acute subarachnoid hemorrhage identified in the right temporal and temporoparietal region. No associated mass effect or midline shift. 2. No cervical spine fracture. 3. Chronic paranasal sinusitis. 4. Chronic deformity of the mandibular condyles bilaterally, potentially posttraumatic or degenerative with subluxation of the bilateral temporomandibular joints. Critical Value/emergent results were called by telephone at the time of interpretation on 10/08/2019 at 10:52 am to provider JOSEPH ZAMMIT , who verbally acknowledged these results. Electronically Signed   By: Eric  Mansell M.D.   On: 10/08/2019 10:53   CT ABDOMEN PELVIS W CONTRAST  Result Date: 10/08/2019 CLINICAL DATA:  Fall, left-sided rib pain. History of appendectomy EXAM: CT CHEST, ABDOMEN, AND PELVIS WITH CONTRAST TECHNIQUE: Multidetector CT imaging of the chest, abdomen and pelvis was performed following the standard protocol during bolus administration of intravenous contrast. CONTRAST:  <MEASUREMENTWendel Wendel Wendel Wendel Wendel Wendelyn BreslowAQUE IOHEXOL 300 MG/ML  SOLN COMPARISON:  CT chest 09/21/2017, CT abdomen-pelvis 08/25/2019 FINDINGS: CT CHEST FINDINGS Cardiovascular: Normal  heart size. No pericardial effusion. Thoracic aorta is normal in course and caliber. Pulmonary trunk is nondilated. No significant vascular findings identified. Mediastinum/Nodes: No enlarged mediastinal, hilar, or axillary lymph nodes. Thyroid gland, trachea, and esophagus demonstrate no significant findings. Lungs/Pleura: Lungs are clear.  No pleural effusion or pneumothorax. Musculoskeletal: Healed fractures of the posterior right eighth, ninth, and tenth ribs. Normal spinal alignment. Vertebral body heights are maintained. No acute osseous findings. No chest wall hematoma or other abnormality. CT ABDOMEN PELVIS FINDINGS Hepatobiliary: Diffusely decreased attenuation of the hepatic parenchyma compatible with hepatic steatosis. No focal hepatic injury or pericapsular collection. Gallbladder unremarkable. No gallstone or biliary dilatation. Pancreas: Unremarkable. No pancreatic ductal dilatation or surrounding inflammatory changes. Spleen: No splenic injury or perisplenic hematoma. Adrenals/Urinary Tract: No adrenal hemorrhage or renal injury identified. Bladder is unremarkable. Stomach/Bowel: Stomach is within normal limits. Appendix surgically absent. Scattered colonic diverticulosis. No evidence of bowel wall thickening, distention, or inflammatory changes. Vascular/Lymphatic: No significant vascular findings are present. No enlarged abdominal or pelvic lymph nodes. Reproductive: Prostate is unremarkable. Other: Small fat containing umbilical hernia. No abdominopelvic ascites. Musculoskeletal: Induration within the subcutaneous tissues overlying the left hip and gluteal region with focal hyperdense fluid collection measuring 5.6 x 1.7 x 6.5 cm (volume = 32 cm^3) suggestive of a hematoma (series 2, image 117). Pelvic bony ring is intact without fracture. Bilateral hip joints intact without fracture or dislocation. Lumbar vertebral body heights are maintained without fracture or static listhesis. Intervertebral  disc height loss of L5-S1. IMPRESSION: 1. Soft tissue hematoma overlying the left hip/gluteal region measuring up to 6.5 cm. 2. No acute osseous abnormality. 3. No acute findings within the chest. No solid organ or visceral injury within the abdomen or pelvis. 4. Hepatic steatosis. Electronically Signed   By: Duanne Guess D.O.   On: 10/08/2019 10:59   CARDIAC CATHETERIZATION  Result Date: 10/12/2019 1. Low filling pressures. 2. Preserved cardiac output. 3. No significant coronary disease. Nonischemic cardiomyopathy.   DG Shoulder Left  Result Date: 10/08/2019 CLINICAL DATA:  Fall, pain EXAM: LEFT SHOULDER - 2+ VIEW COMPARISON:  None. FINDINGS: There is no evidence of fracture or dislocation. There is no evidence of arthropathy or other focal bone abnormality. Soft tissues are unremarkable. IMPRESSION: No fracture or dislocation of the left shoulder. Joint spaces are preserved. Electronically Signed   By: Lauralyn Primes M.D.   On: 10/08/2019 10:11   MR CARDIAC MORPHOLOGY W WO CONTRAST  Result Date: 10/13/2019 CLINICAL DATA:  Cardiomyopathy of uncertain etiology. EXAM: CARDIAC MRI TECHNIQUE: The patient was scanned on a 1.5 Tesla GE magnet. A dedicated cardiac coil was used. Functional imaging was done using Fiesta sequences. 2,3, and 4 chamber views were done to assess for RWMA's. Modified Simpson's rule using a short axis stack was used to calculate an ejection fraction on a dedicated work [employer]. The patient received 11 cc of Gadavist. After 10 minutes inversion recovery sequences were used to assess for infiltration and scar tissue. CONTRAST:  Gadavist 11 cc FINDINGS: Limited images of the lung fields showed no gross abnormalities. Mildly dilated left ventricle with normal wall thickness. EF 25%, diffuse severe hypokinesis. No LV thrombus noted. Normal right ventricular size and systolic function, EF 55%. Normal left atrial size. Normal right atrial size. Trileaflet aortic valve  with no stenosis or regurgitation. No significant mitral regurgitation noted. On delayed enhancement imaging, there was no significant myocardial late gadolinium enhancement (LGE). Measurements: LVEDV 202 mL LVSV 52 mL LVEF 25% RVEDV 89 mL RVSV 49 mL RVEF 55% IMPRESSION: 1.  Mildly dilated LV with EF 25%, diffuse hypokinesis. 2.  Normal RV size and systolic function, EF 55%. 3. No myocardial LGE, so no definitive evidence for prior MI, infiltrative disease, or myocarditis. [employer]  Electronically Signed   By: Marca Ancona M.D.   On: 10/13/2019 17:28   ECHOCARDIOGRAM COMPLETE  Result Date: 10/09/2019    ECHOCARDIOGRAM REPORT   Patient Name:   DEERIC CRUISE Date of Exam: 10/09/2019 Medical Rec #:  [id number]          Height:       72.0 in Accession #:    [id number]         Weight:       205.0 lb Date of Birth:  1975/11/03          BSA:          2.153 m Patient Age:    43 years           BP:           119/85 mmHg Patient Gender: M                  HR:           76 bpm. Exam Location:  Inpatient Procedure: 2D Echo and Intracardiac Opacification Agent Indications:    Syncope 780.2 / R55  History:        Patient has no prior history of Echocardiogram examinations. No                 prior cardiac history.  Sonographer:    Leeroy Bock Turrentine Referring Phys: [id number] Heloise Beecham EMOKPAE IMPRESSIONS  1. Left ventricular ejection fraction, by estimation, is <20%. The left ventricle has severely decreased function. The left ventricle demonstrates global hypokinesis. The left ventricular internal cavity size was severely dilated. Left ventricular diastolic parameters were normal.  2. Right ventricular systolic function is normal. The right ventricular size is normal.  3. The mitral valve is normal in structure and function. Trivial mitral valve regurgitation. No evidence of mitral stenosis.  4. The aortic valve is normal in structure and function. Aortic valve regurgitation is not visualized. No aortic stenosis is  present.  5. The inferior vena cava is normal in size with greater than 50% respiratory variability, suggesting right atrial pressure of 3 mmHg. FINDINGS  Left Ventricle: Left ventricular ejection fraction, by estimation, is <20%. The left ventricle has severely decreased function. The left ventricle demonstrates global hypokinesis. Definity contrast agent was given IV to delineate the left ventricular endocardial borders. The left ventricular internal cavity size was severely dilated. There is no left ventricular hypertrophy. Left ventricular diastolic parameters were normal. Right Ventricle: The right ventricular size is normal. No increase in right ventricular wall thickness. Right ventricular systolic function is normal. Left Atrium: Left atrial size was normal in size. Right Atrium: Right atrial size was normal in size. Pericardium: There is no evidence of pericardial effusion. Mitral Valve: The mitral valve is normal in structure and function. Normal mobility of the mitral valve leaflets. Trivial mitral valve regurgitation. No evidence of mitral valve stenosis. Tricuspid Valve: The tricuspid valve is normal in structure. Tricuspid valve regurgitation is trivial. No evidence of tricuspid stenosis. Aortic Valve: The aortic valve is normal in structure and function. Aortic valve regurgitation is not visualized. No aortic stenosis is present. Pulmonic Valve: The pulmonic valve was normal in structure. Pulmonic valve regurgitation is not visualized. No evidence of pulmonic stenosis. Aorta: The aortic root is normal in size and structure. Venous: The inferior vena cava is normal in size with greater than 50% respiratory variability, suggesting right atrial pressure of 3 mmHg. IAS/Shunts: No atrial level shunt detected by color flow Doppler.  LEFT VENTRICLE PLAX 2D LVIDd:         6.00 cm  Diastology LVIDs:         5.20 cm  LV e' lateral:   10.90 cm/s LV PW:         0.80 cm  LV E/e' lateral: 6.4 LV IVS:        0.80 cm   LV e' medial:    5.11 cm/s LVOT diam:     2.30 cm  LV E/e' medial:  13.7 LV SV:         52 LV SV Index:   24 LVOT Area:     4.15 cm  RIGHT VENTRICLE RV S prime:     12.60 cm/s TAPSE (M-mode): 1.9 cm LEFT ATRIUM             Index       RIGHT ATRIUM           Index LA diam:        3.70 cm 1.72 cm/m  RA Area:     13.90 cm LA Vol (A2C):   66.2 ml 30.75 ml/m RA Volume:   37.60 ml  17.46 ml/m LA Vol (A4C):   59.6 ml 27.68 ml/m LA Biplane Vol: 69.6 ml 32.33 ml/m  AORTIC VALVE LVOT Vmax:   70.70 cm/s LVOT Vmean:  51.400 cm/s LVOT VTI:    0.126 m  AORTA Ao Root diam: 2.80 cm MITRAL VALVE MV Area (PHT): 4.60 cm    SHUNTS MV Decel Time: 165 msec    Systemic VTI:  0.13 m MV E velocity: 69.80 cm/s  Systemic Diam: 2.30 cm MV A velocity: 48.70 cm/s MV E/A ratio:  1.43 Charlton Haws MD Electronically signed by Charlton Haws MD Signature Date/Time: 10/09/2019/9:22:13 AM    Final        Discharge Exam: Vitals:   10/14/19 0857 10/14/19 1127  BP: 117/68 (!) 92/55  Pulse: 77 (!) 57  Resp: 18 18  Temp: 97.8 F (36.6 C) 98.3 F (36.8 C)  SpO2:  97%    General: Pt is alert, awake, not in acute distress Cardiovascular: RRR, S1/S2 +, no edema Respiratory: CTA bilaterally, no wheezing, no rhonchi, no respiratory distress, no conversational dyspnea  Abdominal: Soft, NT, ND, bowel sounds + Extremities: no edema, no cyanosis Psych: Normal mood and affect, stable judgement and insight     The results of significant diagnostics from this hospitalization (including imaging, microbiology, ancillary and laboratory) are listed below for reference.     Microbiology: Recent Results (from the past 240 hour(s))  Respiratory Panel by RT PCR (Flu A&B, Covid) - Nasopharyngeal Swab     Status: None   Collection Time: 10/08/19 12:28 PM   Specimen: Nasopharyngeal Swab  Result Value Ref Range Status   SARS Coronavirus 2 by RT PCR NEGATIVE NEGATIVE Final    Comment: (NOTE) SARS-CoV-2 target nucleic acids are NOT  DETECTED. The SARS-CoV-2 RNA is generally detectable in upper respiratoy specimens during the acute phase of infection. The lowest concentration of SARS-CoV-2 viral copies this assay can detect is 131 copies/mL. A negative result does not preclude SARS-Cov-2 infection and should not be used as the sole basis for treatment or other patient management decisions. A negative result may occur with  improper specimen collection/handling, submission of specimen other than nasopharyngeal swab, presence of viral mutation(s) within the areas targeted by this assay, and inadequate number of viral copies (<131 copies/mL). A negative result must be combined with clinical observations, patient  history, and epidemiological information. The expected result is Negative. Fact Sheet for Patients:  https://www.moore.com/ Fact Sheet for Healthcare Providers:  https://www.young.biz/ This test is not yet ap proved or cleared by the [place] FDA and  has been authorized for detection and/or diagnosis of SARS-CoV-2 by FDA under an Emergency Use Authorization (EUA). This EUA will remain  in effect (meaning this test can be used) for the duration of the COVID-19 declaration under Section 564(b)(1) of the Act, 21 U.S.C. section 360bbb-3(b)(1), unless the authorization is terminated or revoked sooner.    Influenza A by PCR NEGATIVE NEGATIVE Final   Influenza B by PCR NEGATIVE NEGATIVE Final    Comment: (NOTE) The Xpert Xpress SARS-CoV-2/FLU/RSV assay is intended as an aid in  the diagnosis of influenza from Nasopharyngeal swab specimens and  should not be used as a sole basis for treatment. Nasal washings and  aspirates are unacceptable for Xpert Xpress SARS-CoV-2/FLU/RSV  testing. Fact Sheet for Patients: https://www.moore.com/ Fact Sheet for Healthcare Providers: https://www.young.biz/ This test is not yet approved or cleared  by the [place] FDA and  has been authorized for detection and/or diagnosis of SARS-CoV-2 by  FDA under an Emergency Use Authorization (EUA). This EUA will remain  in effect (meaning this test can be used) for the duration of the  Covid-19 declaration under Section 564(b)(1) of the Act, 21  U.S.C. section 360bbb-3(b)(1), unless the authorization is  terminated or revoked. Performed at [facility], [address], [place], [place] [address]   MRSA PCR Screening     Status: None   Collection Time: 10/10/19  9:58 AM   Specimen: Nasal Mucosa; Nasopharyngeal  Result Value Ref Range Status   MRSA by PCR NEGATIVE NEGATIVE Final    Comment:        The GeneXpert MRSA Assay (FDA approved for NASAL specimens only), is one component of a comprehensive MRSA colonization surveillance program. It is not intended to diagnose MRSA infection nor to guide or monitor treatment for MRSA infections. Performed at [facility], [address], [place], [place] [address]      Labs: BNP (last 3 results) No results for input(s): BNP in the last 8760 hours. Basic Metabolic Panel: Recent Labs  Lab 10/08/19 1537 10/09/19 0311 10/10/19 0257 10/10/19 0257 10/11/19 0523 10/11/19 0523 10/12/19 0509 10/12/19 1323 10/12/19 1324 10/13/19 0349 10/14/19 0550  NA  --    < > 137   < > 134*   < > 137 137 137 136 134*  K  --    < > 4.3   < > 4.1   < > 4.4 4.2 4.2 4.3 4.4  CL  --    < > 101  --  100  --  103  --   --  103 97*  CO2  --    < > 27  --  24  --  24  --   --  24 27  GLUCOSE  --    < > 111*  --  106*  --  106*  --   --  99 106*  BUN  --    < > 16  --  15  --  21*  --   --  21* 21*  CREATININE  --    < > 1.32*  --  1.16  --  1.10  --   --  1.12 1.20  CALCIUM  --    < > 9.1  --  9.1  --  9.4  --   --  9.4 9.6  MG 2.2  --   --   --   --   --   --   --   --   --   --    < > = values in this interval not displayed.   Liver Function Tests: Recent Labs  Lab 10/08/19 0908 10/09/19 0311   AST 50* 29  ALT 86* 65*  ALKPHOS 51 52  BILITOT 0.6 0.8  PROT 7.5 6.5  ALBUMIN 4.1 3.5   No results for input(s): LIPASE, AMYLASE in the last 168 hours. No results for input(s): AMMONIA in the last 168 hours. CBC: Recent Labs  Lab 10/08/19 0908 10/09/19 0311 10/12/19 0509 10/12/19 1323 10/12/19 1324  WBC 7.6 8.6 7.8  --   --   NEUTROABS 5.4  --   --   --   --   HGB 14.0 13.6 14.2 15.0 15.3  HCT 42.3 41.0 42.6 44.0 45.0  MCV 96.4 96.7 95.1  --   --   PLT 332 329 343  --   --    Cardiac Enzymes: No results for input(s): CKTOTAL, CKMB, CKMBINDEX, TROPONINI in the last 168 hours. BNP: Invalid input(s): POCBNP CBG: Recent Labs  Lab 10/12/19 1215 10/12/19 1650  GLUCAP 102* 72   D-Dimer No results for input(s): DDIMER in the last 72 hours. Hgb A1c No results for input(s): HGBA1C in the last 72 hours. Lipid Profile No results for input(s): CHOL, HDL, LDLCALC, TRIG, CHOLHDL, LDLDIRECT in the last 72 hours. Thyroid function studies No results for input(s): TSH, T4TOTAL, T3FREE, THYROIDAB in the last 72 hours.  Invalid input(s): FREET3 Anemia work up No results for input(s): VITAMINB12, FOLATE, FERRITIN, TIBC, IRON, RETICCTPCT in the last 72 hours. Urinalysis    Component Value Date/Time   COLORURINE YELLOW 08/25/2019 1421   APPEARANCEUR CLEAR 08/25/2019 1421   LABSPEC 1.023 08/25/2019 1421   PHURINE 5.0 08/25/2019 1421   GLUCOSEU NEGATIVE 08/25/2019 1421   HGBUR NEGATIVE 08/25/2019 1421   BILIRUBINUR NEGATIVE 08/25/2019 1421   KETONESUR NEGATIVE 08/25/2019 1421   PROTEINUR 30 (A) 08/25/2019 1421   NITRITE NEGATIVE 08/25/2019 1421   LEUKOCYTESUR NEGATIVE 08/25/2019 1421   Sepsis Labs Invalid input(s): PROCALCITONIN,  WBC,  LACTICIDVEN Microbiology Recent Results (from the past 240 hour(s))  Respiratory Panel by RT PCR (Flu A&B, Covid) - Nasopharyngeal Swab     Status: None   Collection Time: 10/08/19 12:28 PM   Specimen: Nasopharyngeal Swab  Result Value  Ref Range Status   SARS Coronavirus 2 by RT PCR NEGATIVE NEGATIVE Final    Comment: (NOTE) SARS-CoV-2 target nucleic acids are NOT DETECTED. The SARS-CoV-2 RNA is generally detectable in upper respiratoy specimens during the acute phase of infection. The lowest concentration of SARS-CoV-2 viral copies this assay can detect is 131 copies/mL. A negative result does not preclude SARS-Cov-2 infection and should not be used as the sole basis for treatment or other patient management decisions. A negative result may occur with  improper specimen collection/handling, submission of specimen other than nasopharyngeal swab, presence of viral mutation(s) within the areas targeted by this assay, and inadequate number of viral copies (<131 copies/mL). A negative result must be combined with clinical observations, patient history, and epidemiological information. The expected result is Negative. Fact Sheet for Patients:  https://www.moore.com/ Fact Sheet for Healthcare Providers:  https://www.young.biz/ This test is not yet ap proved or cleared by the [place] and  has been authorized for  detection and/or diagnosis of SARS-CoV-2 by FDA under an Emergency Use Authorization (EUA). This EUA will remain  in effect (meaning this test can be used) for the duration of the COVID-19 declaration under Section 564(b)(1) of the Act, 21 U.S.C. section 360bbb-3(b)(1), unless the authorization is terminated or revoked sooner.    Influenza A by PCR NEGATIVE NEGATIVE Final   Influenza B by PCR NEGATIVE NEGATIVE Final    Comment: (NOTE) The Xpert Xpress SARS-CoV-2/FLU/RSV assay is intended as an aid in  the diagnosis of influenza from Nasopharyngeal swab specimens and  should not be used as a sole basis for treatment. Nasal washings and  aspirates are unacceptable for Xpert Xpress SARS-CoV-2/FLU/RSV  testing. Fact Sheet for  Patients: https://www.moore.com/ Fact Sheet for Healthcare Providers: https://www.young.biz/ This test is not yet approved or cleared by the [place] FDA and  has been authorized for detection and/or diagnosis of SARS-CoV-2 by  FDA under an Emergency Use Authorization (EUA). This EUA will remain  in effect (meaning this test can be used) for the duration of the  Covid-19 declaration under Section 564(b)(1) of the Act, 21  U.S.C. section 360bbb-3(b)(1), unless the authorization is  terminated or revoked. Performed at [facility], [address], [place], [place] [address]   MRSA PCR Screening     Status: None   Collection Time: 10/10/19  9:58 AM   Specimen: Nasal Mucosa; Nasopharyngeal  Result Value Ref Range Status   MRSA by PCR NEGATIVE NEGATIVE Final    Comment:        The GeneXpert MRSA Assay (FDA approved for NASAL specimens only), is one component of a comprehensive MRSA colonization surveillance program. It is not intended to diagnose MRSA infection nor to guide or monitor treatment for MRSA infections. Performed at [facility], [address], [place], [place] [address]      Patient was seen and examined on the day of discharge and was found to be in stable condition. Time coordinating discharge: 25 minutes including assessment and coordination of care, as well as examination of the patient.   SIGNED:  Noralee Stain, DO [facility] 10/14/2019, 12:30 PM

## 2019-10-14 NOTE — TOC Transition Note (Signed)
Transition of Care [facility]) - CM/SW Discharge Note   Patient Details  Name: Andrew Hinton MRN: [id number] Date of Birth: [birth year]  [facility] CM/SW Contact:  Kermit Balo, RN Phone Number: 10/14/2019, 2:13 PM   Clinical Narrative:    Pt discharging home with outpatient therapy. Therapy arranged at [facility] for convenience to pt.  Life vest for home at the bedside.  Pt provided MATCH for d/c medications which were delivered to the room via TOC.  Pt to f/u with one of the clinics in [place] for PCP.  Pt states he has transportation home.   Final next level of care: OP Rehab Barriers to Discharge: Inadequate or no insurance, Barriers Unresolved (comment)   Patient Goals and CMS Choice     Choice offered to / list presented to : Patient  Discharge Placement                       Discharge Plan and Services   Discharge Planning Services: CM Consult                                 Social Determinants of Health (SDOH) Interventions     Readmission Risk Interventions No flowsheet data found.

## 2019-10-14 NOTE — Progress Notes (Addendum)
Patient ID: Andrew Hinton, male   DOB: 1975/10/02, 44 y.o.   MRN: [id number]     Advanced Heart Failure Rounding Note  PCP-Cardiologist: No primary care provider on file.   Subjective:    Complaining headache. Denies SOB.   LHC/RHC:  Coronary Findings  Diagnostic Dominance: Right Left Main  No significant coronary disease.  Left Anterior Descending  No significant coronary disease.  Left Circumflex  No significant coronary disease.  Right Coronary Artery  No significant coronary disease.  Intervention  No interventions have been documented. Right Heart  Right Heart Pressures RHC Procedural Findings: Hemodynamics (mmHg) RA mean 3 RV 21/4 PA 24/5, mean 11 PCWP mean 3 LV 90/11 AO 84/53  Oxygen saturations: PA 68% AO 98%  Cardiac Output (Fick) 4.85  Cardiac Index (Fick) 2.3      Objective:   Weight Range: 88.6 kg Body mass index is 26.49 kg/m.   Vital Signs:   Temp:  [97.4 F (36.3 C)-98.3 F (36.8 C)] 98.3 F (36.8 C) (03/03 1127) Pulse Rate:  [57-77] 57 (03/03 1127) Resp:  [16-18] 18 (03/03 1127) BP: (92-117)/(55-68) 92/55 (03/03 1127) SpO2:  [97 %-100 %] 97 % (03/03 1127) Last BM Date: 10/09/19  Weight change: Filed Weights   10/08/19 0811 10/09/19 2118  Weight: 93 kg 88.6 kg    Intake/Output:   Intake/Output Summary (Last 24 hours) at 10/14/2019 1157 Last data filed at 10/14/2019 0400 Gross per 24 hour  Intake 500 ml  Output 700 ml  Net -200 ml      Physical Exam    General:  Well appearing. No resp difficulty HEENT: normal Neck: supple. no JVD. Carotids 2+ bilat; no bruits. No lymphadenopathy or thryomegaly appreciated. Cor: PMI nondisplaced. Regular rate & rhythm. No rubs, gallops or murmurs. Lungs: clear Abdomen: soft, nontender, nondistended. No hepatosplenomegaly. No bruits or masses. Good bowel sounds. Extremities: no cyanosis, clubbing, rash, edema Neuro: alert & orientedx3, cranial nerves grossly intact. moves all 4  extremities w/o difficulty. Affect pleasant   Telemetry   NSR 80s personally reviewed.   Labs    CBC Recent Labs    10/12/19 0509 10/12/19 0509 10/12/19 1323 10/12/19 1324  WBC 7.8  --   --   --   HGB 14.2   < > 15.0 15.3  HCT 42.6   < > 44.0 45.0  MCV 95.1  --   --   --   PLT 343  --   --   --    < > = values in this interval not displayed.   Basic Metabolic Panel Recent Labs    40/98/11 0349 10/14/19 0550  NA 136 134*  K 4.3 4.4  CL 103 97*  CO2 24 27  GLUCOSE 99 106*  BUN 21* 21*  CREATININE 1.12 1.20  CALCIUM 9.4 9.6   Liver Function Tests No results for input(s): AST, ALT, ALKPHOS, BILITOT, PROT, ALBUMIN in the last 72 hours. No results for input(s): LIPASE, AMYLASE in the last 72 hours. Cardiac Enzymes No results for input(s): CKTOTAL, CKMB, CKMBINDEX, TROPONINI in the last 72 hours.  BNP: BNP (last 3 results) No results for input(s): BNP in the last 8760 hours.  ProBNP (last 3 results) No results for input(s): PROBNP in the last 8760 hours.   D-Dimer No results for input(s): DDIMER in the last 72 hours. Hemoglobin A1C No results for input(s): HGBA1C in the last 72 hours. Fasting Lipid Panel No results for input(s): CHOL, HDL, LDLCALC, TRIG, CHOLHDL, LDLDIRECT in the  last 72 hours. Thyroid Function Tests No results for input(s): TSH, T4TOTAL, T3FREE, THYROIDAB in the last 72 hours.  Invalid input(s): FREET3  Other results:   Imaging    MR CARDIAC MORPHOLOGY W WO CONTRAST  Result Date: 10/13/2019 CLINICAL DATA:  Cardiomyopathy of uncertain etiology. EXAM: CARDIAC MRI TECHNIQUE: The patient was scanned on a 1.5 Tesla GE magnet. A dedicated cardiac coil was used. Functional imaging was done using Fiesta sequences. 2,3, and 4 chamber views were done to assess for RWMA's. Modified Simpson's rule using a short axis stack was used to calculate an ejection fraction on a dedicated work Conservation officer, nature. The patient received 11 cc of  Gadavist. After 10 minutes inversion recovery sequences were used to assess for infiltration and scar tissue. CONTRAST:  Gadavist 11 cc FINDINGS: Limited images of the lung fields showed no gross abnormalities. Mildly dilated left ventricle with normal wall thickness. EF 25%, diffuse severe hypokinesis. No LV thrombus noted. Normal right ventricular size and systolic function, EF 42%. Normal left atrial size. Normal right atrial size. Trileaflet aortic valve with no stenosis or regurgitation. No significant mitral regurgitation noted. On delayed enhancement imaging, there was no significant myocardial late gadolinium enhancement (LGE). Measurements: LVEDV 202 mL LVSV 52 mL LVEF 25% RVEDV 89 mL RVSV 49 mL RVEF 55% IMPRESSION: 1.  Mildly dilated LV with EF 25%, diffuse hypokinesis. 2.  Normal RV size and systolic function, EF 70%. 3. No myocardial LGE, so no definitive evidence for prior MI, infiltrative disease, or myocarditis. Andrew Hinton Electronically Signed   By: Loralie Champagne M.D.   On: 10/13/2019 17:28     Medications:     Scheduled Medications: . carvedilol  3.125 mg Oral BID WC  . digoxin  0.125 mg Oral Daily  . losartan  12.5 mg Oral BID  . oxyCODONE-acetaminophen  1 tablet Oral Q4H  . sodium chloride flush  3 mL Intravenous Q12H  . spironolactone  25 mg Oral Daily    Infusions: . sodium chloride      PRN Medications: sodium chloride, acetaminophen **OR** acetaminophen, meclizine, morphine injection, ondansetron **OR** ondansetron (ZOFRAN) IV, sodium chloride flush   Assessment/Plan   1. Syncope: Patient had some orthostatic symptoms at home, possible that syncope was an orthostatic event.  However, with markedly low EF and the feeling of his heart racing prior to syncope, I am concerned for VT/VF.  No events on telemetry in the hospital.  - He will need a Lifevest at discharge. Life Vest in the room  2. Cardiomyopathy: Newly found, nonischemic cardiomyopathy.  The LV is  dilated with EF < 20%, the RV appears normal.  Given the LV dilation, I suspect that the cardiomyopathy is not new (probably less likely to be a stress cardiomyopathy related to the subarachnoid hemorrhage). He was a heavy drinker at one point but not recently, possible ETOH cardiomyopathy but would have had to be long-standing and just now coming to light, as he reports no heavy drinking for 2 years.  Cannot rule out viral cardiomyopathy.  He has multiple family members with "heart disease" but he does not have details. Coronary angiography today showed no significant CAD.  His symptoms currently seem to be due to vertigo and not orthostasis.  - RHC  filling pressures were low . - Volume status stable.  - Continue digoxin 0.125 mg daily. - Continue spironolactone 25 mg daily.  - Continue losartan 12.5 bid.  - Continue Coreg 3.125 mg bid.  -MRI- no infiltrative  disease, no prior MI. EF 25%. RV normal.  3. SAH: Small volume SAH post-fall.  Per neurosurgery, conservative management. Having ongoing headaches.   Meds for d/c Digoxin 0.125 mg daily Spironolactone 25 mg daily  Losartan 12.5 mg twice a day.  Coreg 3.125 mg twice a day.   Life Vest in the room for discharge.   OK for d/c HF follow up set up. He will need meds prior to d/c  Length of Stay: 6  Amy Clegg, NP  10/14/2019, 11:57 AM  Advanced Heart Failure Team Pager [phone] (M-F; 7a - 4p)  Please contact [facility] for night-coverage after hours (4p -7a ) and weekends on [email or web address]  Patient seen with NP, agree with the above note.   He is ready for discharge today on the above medications.  Will make followup in CHF clinic.   Marca Ancona 10/14/2019 12:51 PM

## 2019-10-14 NOTE — Progress Notes (Signed)
Discussed HF booklet with pt. Pt receptive. Now watching HF video. Gave him and discussed low sodium diet. Did not discuss exercise guidelines as his walking has been limited due to [facility] and vertigo. Encouraged him to slowly increase his activity until he is ready to walk regularly. He needs a scale but no charity scales available right now.  1310-1400 Ethelda Chick CES, ACSM 2:01 PM 10/14/2019

## 2019-10-14 NOTE — Progress Notes (Signed)
Patient IV removed without complication. Life vest is on and patient has all related equipment. Home medication was delivered from pharmacy and placed with personal belongings. Discharge paperwork reviewed and patient verbalized understanding. All personal belongings collected. Patient transported to personal transportation via wheelchair.

## 2019-10-19 ENCOUNTER — Telehealth (HOSPITAL_COMMUNITY): Payer: Self-pay | Admitting: Licensed Clinical Social Worker

## 2019-10-19 NOTE — Telephone Encounter (Signed)
CSW called pt to discuss mental health resources for him in his area to help address current anxiety and depression as well as just to check in with pt following recent hospital DC and upcoming appt.  Unable tor reach- left VM requesting return call   CSW will continue to follow and assist as needed  Burna Sis, LCSW Clinical Social Worker Advanced Heart Failure Clinic Desk#: [phone] Cell#: [phone]

## 2019-10-20 ENCOUNTER — Ambulatory Visit (HOSPITAL_COMMUNITY): Payer: Medicaid Other | Attending: Internal Medicine | Admitting: Physical Therapy

## 2019-10-20 ENCOUNTER — Encounter (HOSPITAL_COMMUNITY): Payer: Self-pay | Admitting: Physical Therapy

## 2019-10-20 ENCOUNTER — Other Ambulatory Visit: Payer: Self-pay

## 2019-10-20 DIAGNOSIS — M542 Cervicalgia: Secondary | ICD-10-CM | POA: Insufficient documentation

## 2019-10-20 DIAGNOSIS — R42 Dizziness and giddiness: Secondary | ICD-10-CM | POA: Diagnosis present

## 2019-10-20 NOTE — Therapy (Signed)
[facility] [address] [place], [place], [address] Phone: [phone]   Fax:  [phone]  Physical Therapy Evaluation  Patient Details  Name: Andrew Hinton MRN: [id number] Date of Birth: 1976-04-06 Referring Provider (PT): Noralee Stain DO    Encounter Date: 10/20/2019  PT End of Session - 10/20/19 1011    Visit Number  1    Number of Visits  8    Date for PT Re-Evaluation  11/13/19    Authorization Type  Self pay    Authorization Time Period  10/20/19-11/13/19    Progress Note Due on Visit  8    PT Start Time  0905    PT Stop Time  0945    PT Time Calculation (min)  40 min    Equipment Utilized During Treatment  --   external heart monitor   Activity Tolerance  Patient tolerated treatment well;Patient limited by pain    Behavior During Therapy  [facility] for tasks assessed/performed       Past Medical History:  Diagnosis Date  . Anxiety   . Asthma   . Depression   . Nerve damage     Past Surgical History:  Procedure Laterality Date  . APPENDECTOMY    . RIGHT/LEFT HEART CATH AND CORONARY ANGIOGRAPHY N/A 10/12/2019   Procedure: RIGHT/LEFT HEART CATH AND CORONARY ANGIOGRAPHY;  Surgeon: Laurey Morale, MD;  Location: [facility] INVASIVE CV LAB;  Service: Cardiovascular;  Laterality: N/A;    There were no vitals filed for this visit.   Subjective Assessment - 10/20/19 1019    Subjective  Patient presents to physical therapy with complaint of dizziness and balance difficulty. Patient says on 10/08/19 he was walking to the top of his basement stairs at home when he blacked out and fell down the stairs. Patient says he went to the hospital and was diagnosed with subarachnoid hemorrhage and fractured temporal bone. Patient says he also injured his ribs but imaging suggests they are not broken. Patient says he has noticed since the fall that his balance has decreased and that he gets very dizzy when he gets up out of bed or leans forward to pick  object form floor or tie shoes. Patient says the cause of his fainting is likely cardiac from what he was told at hospital and is currently wearing an external defibrillator/ recorder, he has to wear this for 2 months to monitor heart activity. Patient also reports onset of constant pounding headaches since fall.    Pertinent History  temporal bone fracture 10/08/19, vertigo, hx of orthostatic episodes, has external heart monitor    Limitations  House hold activities;Lifting;Other (comment)   transfers, rolling over in bed, bending forward   Diagnostic tests  CT scan    Patient Stated Goals  decrease dizziness and headaches    Currently in Pain?  Yes    Pain Score  10-Worst pain ever    Pain Location  Head    Pain Orientation  Left    Pain Descriptors / Indicators  Aching;Pounding;Throbbing    Pain Type  Acute pain    Pain Onset  1 to 4 weeks ago    Pain Frequency  Constant    Aggravating Factors   loud noise, sit to stand, turning LT, rolling Lt in bed    Pain Relieving Factors  sitting still    Effect of Pain on Daily Activities  Limits         [facility] PT Assessment - 10/20/19  0001      Assessment   Medical Diagnosis  Subarachnoid hemorrhage     Referring Provider (PT)  Noralee Stain DO     Prior Therapy  No       Precautions   Precautions  Fall   Syncope      Restrictions   Weight Bearing Restrictions  No      Balance Screen   Has the patient fallen in the past 6 months  Yes    How many times?  1    Has the patient had a decrease in activity level because of a fear of falling?   Yes    Is the patient reluctant to leave their home because of a fear of falling?   No      Home Public house manager residence    Living Arrangements  Children;Other relatives    Available Help at Discharge  Family    Type of Home  House    Home Access  Stairs to enter    Entrance Stairs-Number of Steps  2    Home Layout  Laundry or work area in basement;One level       Prior Function   Level of Independence  Independent    Vocation  Full time employment;Self employed    Garment/textile technologist       Cognition   Overall Cognitive Status  Within Functional Limits for tasks assessed      Posture/Postural Control   Posture/Postural Control  Postural limitations    Postural Limitations  Rounded Shoulders;Forward head      ROM / Strength   AROM / PROM / Strength  AROM;Strength      AROM   AROM Assessment Site  Cervical    Cervical Flexion  60    Cervical Extension  32   onset of dizziness   Cervical - Right Rotation  68    Cervical - Left Rotation  40   slight dizziness     Strength   Strength Assessment Site  Shoulder;Elbow    Right/Left Shoulder  Right;Left    Right Shoulder Flexion  4+/5    Right Shoulder ABduction  4+/5    Right Shoulder External Rotation  4+/5    Left Shoulder Flexion  4/5    Left Shoulder ABduction  4+/5    Left Shoulder External Rotation  4/5    Right/Left Elbow  Right;Left    Right Elbow Flexion  4+/5    Right Elbow Extension  4/5    Left Elbow Flexion  4+/5    Left Elbow Extension  4/5      Palpation   Palpation comment  Mod tnderness to palpation about LT upper trap, cervical parapsinals      Balance   Balance Assessed  --   deferred per subjective complaint, will assess next visit           Vestibular Assessment - 10/20/19 0001      Oculomotor Exam   Oculomotor Alignment  Normal    Smooth Pursuits  Intact    Comment  poor convergence, double vision at approx 12 inch distance       Positional Testing   Dix-Hallpike  Dix-Hallpike Right;Dix-Hallpike Left      Dix-Hallpike Right   Dix-Hallpike Right Symptoms  No nystagmus      Dix-Hallpike Left   Dix-Hallpike Left Symptoms  No nystagmus          Objective measurements completed  on examination: See above findings.              PT Education - 10/20/19 1011    Education Details  on evaluation findings, and POC     Person(s) Educated  Patient    Methods  Explanation    Comprehension  Verbalized understanding       PT Short Term Goals - 10/20/19 1030      PT SHORT TERM GOAL #1   Title  Patient will be independent with initial HEP to improve functional outcomes    Time  2    Period  Weeks    Status  New    Target Date  11/06/19        PT Long Term Goals - 10/20/19 1030      PT LONG TERM GOAL #1   Title  Patient will report at least 50% overall improvement in subjective complaint of dizziness and headaches to indicate improvement in ability to perform ADLs.    Time  4    Period  Weeks    Status  New    Target Date  11/13/19      PT LONG TERM GOAL #2   Title  Patient will report 0 falls since starting therapy to indicate improved balance and safety awareness    Time  4    Period  Weeks    Status  New    Target Date  11/13/19      PT LONG TERM GOAL #3   Title  Patient will improve BUE MMT to at least 4+/5 throughout to improve ability to perform ADLs and indicate increased functional outcomes    Time  4    Period  Weeks    Status  New      PT LONG TERM GOAL #4   Title  Patient will improve LT cervical rotation to within 10 degrees of RT side to improve ability to scan environment/ turn while driving for improved safetey awareness and ability to perform ADLs.    Time  4    Period  Weeks    Status  New    Target Date  11/13/19             Plan - 10/20/19 1023    Clinical Impression Statement  Patient is a 44 y.o. male who presents to physical therapy with complaint of dizziness, decreased balance, and neck pain s/p fall resulting in subarachnoid hemorrhage and LT temporal bone fracture on 10/08/19. Patient demonstrates decreased ROM, weakness, increased tenderness to palpation, postural deficits, and positional vertigo which are likely contributing to sx of pain and are negatively impacting ability to perform ADLs and functional mobility tasks. Patient will benefit from skilled  physical therapy services to address these deficits to reduce pain, dizziness and headaches to improve level of function with ADLs and quality of life, and reduce risk for future falls.    Examination-Activity Limitations  Bathing;Bend;Squat;Bed Mobility;Stairs;Dressing;Transfers;Locomotion Level;Lift    Examination-Participation Restrictions  Cleaning;Community Activity;Yard Work    Stability/Clinical Decision Making  Stable/Uncomplicated    Designer, jewellery  Low    Rehab Potential  Good    PT Frequency  2x / week    PT Duration  4 weeks    PT Treatment/Interventions  ADLs/Self Care Home Management;Aquatic Therapy;Biofeedback;Electrical Stimulation;Balance training;Manual techniques;Therapeutic exercise;Vestibular;Therapeutic activities;Taping;Functional mobility training;Orthotic Fit/Training;Stair training;Energy conservation;Dry needling;Joint Manipulations;Spinal Manipulations;Passive range of motion;Patient/family education;DME Instruction;Gait training;Neuromuscular re-education;Compression bandaging;Visual/perceptual remediation/compensation;Ultrasound    PT Next Visit Plan  Review goals, issue HEP.  HEP to include horizontal saccades and VOR. Assess static standing balance, add manuals for cervical restrictions as indicated for pain and mobility.    PT Home Exercise Plan  issue at next visit    Consulted and Agree with Plan of Care  Patient       Patient will benefit from skilled therapeutic intervention in order to improve the following deficits and impairments:  Decreased activity tolerance, Decreased strength, Pain, Decreased balance, Decreased mobility, Difficulty walking, Decreased range of motion, Dizziness, Impaired flexibility, Postural dysfunction  Visit Diagnosis: Dizziness and giddiness  Cervicalgia     Problem List Patient Active Problem List   Diagnosis Date Noted  . Syncope 10/09/2019  . AKI (acute kidney injury) (HCC) 10/09/2019  . Acute systolic CHF  (congestive heart failure) (HCC) 10/09/2019  . Subarachnoid hemorrhage (HCC) 10/08/2019   10:40 AM, 10/20/19 Georges Lynch PT DPT  Physical Therapist with [place]  [facility]  [phone]   [facility] [address] [place], [place], [address] Phone: [phone]   Fax:  [phone]  Name: Andrew Hinton MRN: [id number] Date of Birth: 05-31-1976

## 2019-10-21 ENCOUNTER — Emergency Department (HOSPITAL_COMMUNITY): Payer: Medicaid Other

## 2019-10-21 ENCOUNTER — Telehealth (HOSPITAL_COMMUNITY): Payer: Self-pay | Admitting: Licensed Clinical Social Worker

## 2019-10-21 ENCOUNTER — Emergency Department (HOSPITAL_COMMUNITY)
Admission: EM | Admit: 2019-10-21 | Discharge: 2019-10-21 | Disposition: A | Discharge status: Home / Self Care | Payer: Medicaid Other | Attending: Emergency Medicine | Admitting: Emergency Medicine

## 2019-10-21 ENCOUNTER — Encounter (HOSPITAL_COMMUNITY): Payer: Self-pay | Admitting: Emergency Medicine

## 2019-10-21 DIAGNOSIS — I5020 Unspecified systolic (congestive) heart failure: Secondary | ICD-10-CM | POA: Diagnosis not present

## 2019-10-21 DIAGNOSIS — S065X0S Traumatic subdural hemorrhage without loss of consciousness, sequela: Secondary | ICD-10-CM

## 2019-10-21 DIAGNOSIS — J45909 Unspecified asthma, uncomplicated: Secondary | ICD-10-CM | POA: Diagnosis not present

## 2019-10-21 DIAGNOSIS — S299XXA Unspecified injury of thorax, initial encounter: Secondary | ICD-10-CM | POA: Diagnosis present

## 2019-10-21 DIAGNOSIS — W0110XA Fall on same level from slipping, tripping and stumbling with subsequent striking against unspecified object, initial encounter: Secondary | ICD-10-CM | POA: Insufficient documentation

## 2019-10-21 DIAGNOSIS — Y939 Activity, unspecified: Secondary | ICD-10-CM | POA: Insufficient documentation

## 2019-10-21 DIAGNOSIS — Z79899 Other long term (current) drug therapy: Secondary | ICD-10-CM | POA: Insufficient documentation

## 2019-10-21 DIAGNOSIS — Y929 Unspecified place or not applicable: Secondary | ICD-10-CM | POA: Insufficient documentation

## 2019-10-21 DIAGNOSIS — S2232XA Fracture of one rib, left side, initial encounter for closed fracture: Secondary | ICD-10-CM | POA: Diagnosis not present

## 2019-10-21 DIAGNOSIS — Y999 Unspecified external cause status: Secondary | ICD-10-CM | POA: Diagnosis not present

## 2019-10-21 DIAGNOSIS — Z87891 Personal history of nicotine dependence: Secondary | ICD-10-CM | POA: Insufficient documentation

## 2019-10-21 LAB — BASIC METABOLIC PANEL
Anion gap: 9 (ref 5–15)
BUN: 22 mg/dL — ABNORMAL HIGH (ref 6–20)
CO2: 30 mmol/L (ref 22–32)
Calcium: 9.7 mg/dL (ref 8.9–10.3)
Chloride: 101 mmol/L (ref 98–111)
Creatinine, Ser: 1.04 mg/dL (ref 0.61–1.24)
GFR calc Af Amer: >60 mL/min (ref >60)
GFR calc non Af Amer: >60 mL/min (ref >60)
Glucose, Bld: 97 mg/dL (ref 70–99)
Potassium: 4.6 mmol/L (ref 3.5–5.1)
Sodium: 140 mmol/L (ref 135–145)

## 2019-10-21 LAB — CBC WITH DIFFERENTIAL/PLATELET
Abs Immature Granulocytes: 0.03 10*3/uL (ref 0.00–0.07)
Basophils Absolute: 0.1 10*3/uL (ref 0.0–0.1)
Basophils Relative: 1 %
Eosinophils Absolute: 0.4 10*3/uL (ref 0.0–0.5)
Eosinophils Relative: 4 %
HCT: 43.9 % (ref 39.0–52.0)
Hemoglobin: 14.5 g/dL (ref 13.0–17.0)
Immature Granulocytes: 0 %
Lymphocytes Relative: 19 %
Lymphs Abs: 2 10*3/uL (ref 0.7–4.0)
MCH: 31.9 pg (ref 26.0–34.0)
MCHC: 33 g/dL (ref 30.0–36.0)
MCV: 96.5 fL (ref 80.0–100.0)
Monocytes Absolute: 0.8 10*3/uL (ref 0.1–1.0)
Monocytes Relative: 8 %
Neutro Abs: 7.4 10*3/uL (ref 1.7–7.7)
Neutrophils Relative %: 68 %
Platelets: 451 10*3/uL — ABNORMAL HIGH (ref 150–400)
RBC: 4.55 MIL/uL (ref 4.22–5.81)
RDW: 12 % (ref 11.5–15.5)
WBC: 10.8 10*3/uL — ABNORMAL HIGH (ref 4.0–10.5)
nRBC: 0 % (ref 0.0–0.2)

## 2019-10-21 LAB — D-DIMER, QUANTITATIVE: D-Dimer, Quant: 1 ug/mL-FEU — ABNORMAL HIGH (ref 0.00–0.50)

## 2019-10-21 MED ORDER — ONDANSETRON HCL 4 MG/2ML IJ SOLN
4.0000 mg | Freq: Once | INTRAMUSCULAR | Status: AC
  Administered 2019-10-21: 4 mg via INTRAVENOUS
  Filled 2019-10-21: qty 2

## 2019-10-21 MED ORDER — IOHEXOL 350 MG/ML SOLN
100.0000 mL | Freq: Once | INTRAVENOUS | Status: AC | PRN
  Administered 2019-10-21: 100 mL via INTRAVENOUS

## 2019-10-21 MED ORDER — MORPHINE SULFATE (PF) 4 MG/ML IV SOLN
4.0000 mg | Freq: Once | INTRAVENOUS | Status: AC
  Administered 2019-10-21: 4 mg via INTRAVENOUS
  Filled 2019-10-21: qty 1

## 2019-10-21 MED ORDER — BENZONATATE 100 MG PO CAPS
200.0000 mg | ORAL_CAPSULE | Freq: Once | ORAL | Status: AC
  Administered 2019-10-21: 200 mg via ORAL
  Filled 2019-10-21: qty 2

## 2019-10-21 MED ORDER — BENZONATATE 100 MG PO CAPS: 200.0000 mg | ORAL_CAPSULE | Freq: Three times a day (TID) | ORAL | 0 refills | Status: DC | PRN

## 2019-10-21 NOTE — Discharge Instructions (Addendum)
You may try the Tessalon prescription to help you with the frequency of your cough.  Use the incentive spirometer as instructed to help minimize the risk of developing pneumonia as this rib fracture heals.  Continue using your oxycodone if needed for pain relief.  Your CT brain is normal today which shows complete resolution of your subdural hematoma which is excellent.  Keep your appointment with Dr. Shirlee Latch tomorrow as planned.

## 2019-10-21 NOTE — ED Triage Notes (Signed)
Ribs on LT side hurt when breathing and headache.  Had a fall with traumatic injuries on 02/25.  Pt states he is taking oxycodone every 2 hours with no relief.

## 2019-10-21 NOTE — ED Provider Notes (Signed)
[facility] EMERGENCY DEPARTMENT Provider Note   CSN: [id number] Arrival date & time: 10/21/19  6767     History Chief Complaint  Patient presents with   Rib Injury    Andrew Hinton is a 44 y.o. male who was discharged from the hospital last week secondary to syncope and traumatic resultant subarachnoid hemorrhage presenting with increased headache and left rib pain.  He reports having similar symptoms since his discharge from the hospital on March 5, but his headache and ribs have become more painful since yesterday.  He was found to be in acute CHF during the hospitalization and his syncope was felt to be cardiogenic.  He is currently wearing a LifeVest to monitor him and is scheduled to see his cardiologist tomorrow for his first outpatient follow-up.  He is on multiple new medications for his CHF and is also on meclizine as he has persistent dizziness since the fall.  He is undergoing rehab for the dizziness, last treatment was yesterday.  He has had a cough which has been nonproductive along with hot flushes.  He has pain with attempts to take a deep breath does not feel air hunger.  He also endorses nausea without vomiting, no abdominal pain, no documented fevers.  He has been taking oxycodone for symptom relief but has not had this medication today.  The history is provided by the patient.       Past Medical History:  Diagnosis Date   Anxiety    Asthma    Depression    Nerve damage     Patient Active Problem List   Diagnosis Date Noted   Syncope 10/09/2019   AKI (acute kidney injury) ([place]) 20/94/7096   Acute systolic CHF (congestive heart failure) ([place]) 10/09/2019   Subarachnoid hemorrhage ([place]) 10/08/2019    Past Surgical History:  Procedure Laterality Date   APPENDECTOMY     RIGHT/LEFT HEART CATH AND CORONARY ANGIOGRAPHY N/A 10/12/2019   Procedure: RIGHT/LEFT HEART CATH AND CORONARY ANGIOGRAPHY;  Surgeon: Larey Dresser, MD;  Location: [facility];  Service: Cardiovascular;  Laterality: N/A;       No family history on file.  Social History   Tobacco Use   Smoking status: Former Smoker    Packs/day: 0.50    Types: Cigarettes    Quit date: 10/07/2017    Years since quitting: 2.0   Smokeless tobacco: Never Used  Substance Use Topics   Alcohol use: Yes    Comment: occasionally    Drug use: No    Home Medications Prior to Admission medications   Medication Sig Start Date End Date Taking? Authorizing Provider  benzonatate (TESSALON) 100 MG capsule Take 2 capsules (200 mg total) by mouth 3 (three) times daily as needed. 10/21/19   Evalee Jefferson, PA-C  carvedilol (COREG) 3.125 MG tablet Take 1 tablet (3.125 mg total) by mouth 2 (two) times daily with a meal. 10/14/19   Dessa Phi, DO  digoxin (LANOXIN) 0.125 MG tablet Take 1 tablet (0.125 mg total) by mouth daily. 10/15/19   Dessa Phi, DO  losartan (COZAAR) 25 MG tablet Take 0.5 tablets (12.5 mg total) by mouth in the morning and at bedtime. 10/14/19   Dessa Phi, DO  meclizine (ANTIVERT) 12.5 MG tablet Take 1 tablet (12.5 mg total) by mouth 3 (three) times daily as needed for dizziness. 10/14/19   Dessa Phi, DO  oxyCODONE-acetaminophen (PERCOCET/ROXICET) 5-325 MG tablet Take 1-2 tablets by mouth every 4 (four) hours as needed for severe pain.  10/14/19   Noralee Stain, DO  spironolactone (ALDACTONE) 25 MG tablet Take 1 tablet (25 mg total) by mouth daily. 10/15/19   Noralee Stain, DO    Allergies    Patient has no known allergies.  Review of Systems   Review of Systems  Constitutional: Negative for chills and fever.  HENT: Negative for congestion and sore throat.   Eyes: Negative.   Respiratory: Positive for shortness of breath. Negative for chest tightness.   Cardiovascular: Positive for chest pain.  Gastrointestinal: Negative for abdominal pain, nausea and vomiting.  Genitourinary: Negative.   Musculoskeletal: Negative for arthralgias, joint swelling,  myalgias and neck pain.  Skin: Negative.  Negative for color change, rash and wound.  Neurological: Positive for headaches. Negative for dizziness, weakness, light-headedness and numbness.  Psychiatric/Behavioral: Negative.     Physical Exam Updated Vital Signs BP 120/82    Pulse 62    Temp 97.7 F (36.5 C) (Oral)    Resp 20    Ht 6' (1.829 m)    Wt 90.5 kg    SpO2 100%    BMI 27.06 kg/m   Physical Exam Vitals and nursing note reviewed.  Constitutional:      General: He is in acute distress.     Appearance: He is well-developed.     Comments: Appears uncomfortable, holding left lateral chest.   HENT:     Head: Normocephalic and atraumatic.  Eyes:     Conjunctiva/sclera: Conjunctivae normal.  Cardiovascular:     Rate and Rhythm: Normal rate and regular rhythm.     Heart sounds: Normal heart sounds. No murmur.     Comments: Life vest in place. Pulmonary:     Effort: Pulmonary effort is normal.     Breath sounds: Normal breath sounds. No wheezing.  Abdominal:     General: Bowel sounds are normal. There is no distension.     Palpations: Abdomen is soft.     Tenderness: There is no abdominal tenderness.  Musculoskeletal:        General: No tenderness or deformity. Normal range of motion.     Cervical back: Normal range of motion.     Comments: Trace edema bilateral ankles.  Skin:    General: Skin is warm and dry.  Neurological:     Mental Status: He is alert.     ED Results / Procedures / Treatments   Labs (all labs ordered are listed, but only abnormal results are displayed) Labs Reviewed  CBC WITH DIFFERENTIAL/PLATELET - Abnormal; Notable for the following components:      Result Value   WBC 10.8 (*)    Platelets 451 (*)    All other components within normal limits  BASIC METABOLIC PANEL - Abnormal; Notable for the following components:   BUN 22 (*)    All other components within normal limits  D-DIMER, QUANTITATIVE (NOT AT [facility]) - Abnormal; Notable for the  following components:   D-Dimer, Quant 1.00 (*)    All other components within normal limits    EKG None  Radiology DG Ribs Unilateral W/Chest Left  Result Date: 10/21/2019 CLINICAL DATA:  Left chest wall pain after fall 10/07/2018 EXAM: LEFT RIBS AND CHEST - 3+ VIEW COMPARISON:  10/08/2019 FINDINGS: Normal heart size and mediastinal contours. There is no edema, consolidation, effusion, or pneumothorax. Nondisplaced left anterolateral seventh rib fracture. IMPRESSION: Nondisplaced left seventh rib fracture. No hemothorax or pneumothorax. Electronically Signed   By: Marnee Spring M.D.   On: 10/21/2019 10:34  CT Head Wo Contrast  Result Date: 10/21/2019 CLINICAL DATA:  Larey Seat downstairs in February. Closed head injury. Follow-up intracranial hemorrhage. EXAM: CT HEAD WITHOUT CONTRAST TECHNIQUE: Contiguous axial images were obtained from the base of the skull through the vertex without intravenous contrast. COMPARISON:  10/09/2019 FINDINGS: Brain: The ventricles are normal in size and configuration. No extra-axial fluid collections are identified. The gray-white differentiation is maintained. No CT findings for acute hemispheric infarction or intracranial hemorrhage. The right temporal subarachnoid hemorrhage seen on the prior study has completely resolved. No mass lesions. The brainstem and cerebellum are normal. Vascular: No hyperdense vessels or obvious aneurysm. Skull: No acute skull fracture. No bone lesion. Sinuses/Orbits: Persistent significant opacification of the paranasal sinuses. The mastoid air cells and middle ear cavities are clear. Other: No scalp lesions, laceration or hematoma. IMPRESSION: 1. Complete resolution of the right temporal subarachnoid hemorrhage. 2. No new/acute intracranial findings. 3. Persistent extensive paranasal sinus disease. Electronically Signed   By: Rudie Meyer M.D.   On: 10/21/2019 10:13   CT Angio Chest PE W and/or Wo Contrast  Result Date:  10/21/2019 CLINICAL DATA:  Chest pain and shortness of breath.  Recent fall EXAM: CT ANGIOGRAPHY CHEST WITH CONTRAST TECHNIQUE: Multidetector CT imaging of the chest was performed using the standard protocol during bolus administration of intravenous contrast. Multiplanar CT image reconstructions and MIPs were obtained to evaluate the vascular anatomy. CONTRAST:  OMNIPAQUE IOHEXOL 350 MG/ML SOLN COMPARISON:  Chest CT October 08, 2019. Chest and rib radiographs Dec 21, 2019 FINDINGS: Cardiovascular: There is no demonstrable pulmonary embolus. There is no thoracic aortic aneurysm or dissection. The visualized great vessels appear unremarkable. There is no pericardial effusion or pericardial thickening. Mediastinum/Nodes: Visualized thyroid appears normal. There are subcentimeter lymph nodes in the axillary regions. No adenopathy is evident in the thorax by size criteria. No esophageal lesions are appreciable. Lungs/Pleura: There is no demonstrable pneumothorax. There is slight bibasilar atelectasis. No edema or airspace opacity. No parenchymal lung contusion. No pleural effusions are evident. Upper Abdomen: There is hepatic steatosis. Visualized upper abdominal structures otherwise appear unremarkable. Musculoskeletal: There is a subtle fracture of the anterolateral left seventh rib which appears recent. There are several healed rib fractures on the right. No dislocation. No blastic or lytic bone lesions. No chest wall lesions evident. Review of the MIP images confirms the above findings. IMPRESSION: 1. No demonstrable pulmonary embolus. No thoracic aortic aneurysm or dissection. 2. Slight bibasilar atelectasis. Lungs otherwise clear. No pneumothorax or pleural effusion. 3. Essentially nondisplaced fracture of the anterolateral left seventh rib which appears recent. Several healed rib fractures noted on the right. 4.  Hepatic steatosis. Electronically Signed   By: Bretta Bang III M.D.   On: 10/21/2019  12:29    Procedures Procedures (including critical care time)  Medications Ordered in ED Medications  ondansetron (ZOFRAN) injection 4 mg (4 mg Intravenous Given 10/21/19 1028)  morphine 4 MG/ML injection 4 mg (4 mg Intravenous Given 10/21/19 1028)  benzonatate (TESSALON) capsule 200 mg (200 mg Oral Given 10/21/19 1232)  iohexol (OMNIPAQUE) 350 MG/ML injection 100 mL (100 mLs Intravenous Contrast Given 10/21/19 1206)    ED Course  I have reviewed the triage vital signs and the nursing notes.  Pertinent labs & imaging results that were available during my care of the patient were reviewed by me and considered in my medical decision making (see chart for details).  Clinical Course as of Oct 20 1749  Wed Oct 21, 2019  1376 44 year old  male recent admission for syncope fall traumatic subarachnoid here with continued headache and left-sided chest pain.  Sats 100% on room air.  Nontoxic-appearing.  Awake and alert.  Getting head CT chest x-ray lab work.   [MB]    Clinical Course User Index [MB] Terrilee Files, MD   MDM Rules/Calculators/A&P                      Pt with localized left rib pain confirmed rib fracture, no new injury per pt report since hospitalization for fall on 2/25. Labs stable, d dimer slightly bumped - CT angio given recent hospitalization ie risk for PE - Ct angio negative.  Subdural resolved per todays head CT.  He was given IV morphine with resolution of headache.  Incentive spirometer given. Pt has had no productive cough, tessalon for sx relief.  He has f/u appt with cardiology tomorrow -encouraged to keep this appt.  Final Clinical Impression(s) / ED Diagnoses Final diagnoses:  Closed fracture of one rib of left side, initial encounter  Subdural hematoma due to concussion, without loss of consciousness, sequela (HCC)    Rx / DC Orders ED Discharge Orders         Ordered    benzonatate (TESSALON) 100 MG capsule  3 times daily PRN     10/21/19 1250     Incentive spirometry RT     10/21/19 1250           Burgess Amor, PA-C 10/21/19 1754    Terrilee Files, MD 10/21/19 [phone]

## 2019-10-21 NOTE — Telephone Encounter (Signed)
CSW called pt to check in regarding appt tomorrow and ensure he has access to transportation.  Unable to reach- left message requesting call back.  Burna Sis, LCSW Clinical Social Worker [facility] Desk#: [phone] Cell#: [phone]

## 2019-10-22 ENCOUNTER — Ambulatory Visit (HOSPITAL_COMMUNITY)
Admit: 2019-10-22 | Discharge: 2019-10-22 | Disposition: A | Discharge status: Home / Self Care | Payer: Medicaid Other | Source: Ambulatory Visit | Attending: Adult Health | Admitting: Adult Health

## 2019-10-22 ENCOUNTER — Ambulatory Visit (HOSPITAL_COMMUNITY): Payer: Medicaid Other

## 2019-10-22 ENCOUNTER — Telehealth (HOSPITAL_COMMUNITY): Payer: Self-pay

## 2019-10-22 ENCOUNTER — Encounter (HOSPITAL_COMMUNITY): Payer: Self-pay

## 2019-10-22 ENCOUNTER — Other Ambulatory Visit: Payer: Self-pay

## 2019-10-22 VITALS — BP 120/80 | HR 81 | Wt 197.4 lb

## 2019-10-22 DIAGNOSIS — Z87891 Personal history of nicotine dependence: Secondary | ICD-10-CM | POA: Insufficient documentation

## 2019-10-22 DIAGNOSIS — S066X9A Traumatic subarachnoid hemorrhage with loss of consciousness of unspecified duration, initial encounter: Secondary | ICD-10-CM | POA: Insufficient documentation

## 2019-10-22 DIAGNOSIS — R55 Syncope and collapse: Secondary | ICD-10-CM

## 2019-10-22 DIAGNOSIS — Z79899 Other long term (current) drug therapy: Secondary | ICD-10-CM | POA: Diagnosis not present

## 2019-10-22 DIAGNOSIS — I428 Other cardiomyopathies: Secondary | ICD-10-CM | POA: Diagnosis not present

## 2019-10-22 DIAGNOSIS — I5042 Chronic combined systolic (congestive) and diastolic (congestive) heart failure: Secondary | ICD-10-CM

## 2019-10-22 DIAGNOSIS — W19XXXA Unspecified fall, initial encounter: Secondary | ICD-10-CM | POA: Diagnosis not present

## 2019-10-22 DIAGNOSIS — S2232XA Fracture of one rib, left side, initial encounter for closed fracture: Secondary | ICD-10-CM | POA: Insufficient documentation

## 2019-10-22 DIAGNOSIS — F1010 Alcohol abuse, uncomplicated: Secondary | ICD-10-CM | POA: Diagnosis not present

## 2019-10-22 DIAGNOSIS — I5022 Chronic systolic (congestive) heart failure: Secondary | ICD-10-CM | POA: Insufficient documentation

## 2019-10-22 DIAGNOSIS — Z8249 Family history of ischemic heart disease and other diseases of the circulatory system: Secondary | ICD-10-CM | POA: Insufficient documentation

## 2019-10-22 NOTE — Progress Notes (Signed)
PCP: [facility] Primary Cardiologist: Dr Aundra Dubin   HPI: Andrew Hinton is a 45 year old with history of anxiety, asthma, and depression. Previously a heavy drinker and smoker until 2 years ago. Former heavy drinker.  No longer has drivers license due to multiple DWIs.   Admitted 10/08/19 after syncopal episode and dyspnea with exertion. Had SAH and rib fracture . Had ECHO that showed severely reduced EF. Had new onset acute systolic heart failure. Cath was negative for coronary disease, low filling pressures, and adequate cardiac output. Discharged with Life Vest. Started low dose HF meds. Discharged to home on 10/14/19  Yesterday he was evaluated at [facility] ED due to dyspnea and rib pain. New rib fracture was noted on the left. SAH had resolved. CXR and CTA negative. He was discharged to home.   Today he returns for HF follow up.Overall feeling fair. Having some dizziness that improves with meclizine. Taking meclizine twice a da. Wearing Life Vest. SOB with exertion. Denies PND/Orthopnea. Appetite ok. No fever or chills. Weight at home 196-202  pounds. Has Taking all medications.  LHC/RHC 10/12/19 No coronary disease RA 3 PCWP 3 CO 4.85 CI 2.3   ECHO 10/09/19 EF <20% Global HK, RV normal.   SH: Does not drive. No longer smokes. Stopped drinking 3 weeks ago. Lives with son, mother in law, and father in [place]. Difficulty paying for medications. Work on cars full time.   FH: Mom and 2 sister have heart disease. He has not idea what kind of heart problems. Mother deceased ? Cancer.   ROS: All systems negative except as listed in HPI, PMH and Problem List.  SH:  Social History   Socioeconomic History  . Marital status: Divorced    Spouse name: Not on file  . Number of children: 2  . Years of education: Not on file  . Highest education level: Not on file  Occupational History  . Not on file  Tobacco Use  . Smoking status: Former Smoker    Packs/day: 0.50    Types: Cigarettes     Quit date: 10/07/2017    Years since quitting: 2.0  . Smokeless tobacco: Never Used  Substance and Sexual Activity  . Alcohol use: Yes    Comment: occasionally   . Drug use: No  . Sexual activity: Not on file  Other Topics Concern  . Not on file  Social History Narrative  . Not on file   Social Determinants of Health   Financial Resource Strain: Medium Risk  . Difficulty of Paying Living Expenses: Somewhat hard  Food Insecurity: No Food Insecurity  . Worried About Charity fundraiser in the Last Year: Never true  . Ran Out of Food in the Last Year: Never true  Transportation Needs: No Transportation Needs  . Lack of Transportation (Medical): No  . Lack of Transportation (Non-Medical): No  Physical Activity:   . Days of Exercise per Week:   . Minutes of Exercise per Session:   Stress: Stress Concern Present  . Feeling of Stress : Rather much  Social Connections:   . Frequency of Communication with Friends and Family:   . Frequency of Social Gatherings with Friends and Family:   . Attends Religious Services:   . Active Member of Clubs or Organizations:   . Attends Archivist Meetings:   Marland Kitchen Marital Status:   Intimate Partner Violence:   . Fear of Current or Ex-Partner:   . Emotionally Abused:   Marland Kitchen Physically  Abused:   . Sexually Abused:     FH: No family history on file.  Past Medical History:  Diagnosis Date  . Anxiety   . Asthma   . Depression   . Nerve damage     Current Outpatient Medications  Medication Sig Dispense Refill  . benzonatate (TESSALON) 100 MG capsule Take 2 capsules (200 mg total) by mouth 3 (three) times daily as needed. 30 capsule 0  . carvedilol (COREG) 3.125 MG tablet Take 1 tablet (3.125 mg total) by mouth 2 (two) times daily with a meal. 60 tablet 0  . digoxin (LANOXIN) 0.125 MG tablet Take 1 tablet (0.125 mg total) by mouth daily. 30 tablet 0  . losartan (COZAAR) 25 MG tablet Take 0.5 tablets (12.5 mg total) by mouth in the morning  and at bedtime. 60 tablet 0  . meclizine (ANTIVERT) 12.5 MG tablet Take 1 tablet (12.5 mg total) by mouth 3 (three) times daily as needed for dizziness. 30 tablet 0  . oxyCODONE-acetaminophen (PERCOCET/ROXICET) 5-325 MG tablet Take 1-2 tablets by mouth every 4 (four) hours as needed for severe pain. 60 tablet 0  . spironolactone (ALDACTONE) 25 MG tablet Take 1 tablet (25 mg total) by mouth daily. 30 tablet 0   No current facility-administered medications for this encounter.   Wt Readings from Last 3 Encounters:  10/22/19 89.5 kg (197 lb 6.4 oz)  10/21/19 90.5 kg (199 lb 8 oz)  10/09/19 88.6 kg (195 lb 5.2 oz)    Vitals:   10/22/19 1353  BP: 120/80  Pulse: 81  SpO2: 99%  Weight: 89.5 kg (197 lb 6.4 oz)    PHYSICAL EXAM: General:  No resp difficulty. Has Life Vest  HEENT: normal Neck: supple. JVP flat. Carotids 2+ bilaterally; no bruits. No lymphadenopathy or thryomegaly appreciated. Cor: PMI normal. Regular rate & rhythm. No rubs, gallops or murmurs. Lungs: clear Abdomen: soft, nontender, nondistended. No hepatosplenomegaly. No bruits or masses. Good bowel sounds. Extremities: no cyanosis, clubbing, rash, edema Neuro: alert & orientedx3, cranial nerves grossly intact. Moves all 4 extremities w/o difficulty. Affect pleasant.    ASSESS MENT & PLAN 1. Syncope:  -Recently admitted with syncope.  - Continue Lifevest until repeat ECHO. .  2. Chronic Systolic HF:  Newly found, nonischemic cardiomyopathy. The LV is dilated with EF <20%, the RV appears normal. Given the LV dilation, suspect cardiomyopathy is not new (probably less likely to be a stress cardiomyopathy related to the subarachnoid hemorrhage). He was a heavy drinker at one point but not recently, possible ETOH cardiomyopathy but would have had to be long-standing and just now coming to light, as he reports no heavy drinking for 2 years. Cannot rule out viral cardiomyopathy. He has multiple family members with "heart  disease" but he does not have details. Coronary angiography today showed no significant CAD. -MRI- no infiltrative disease, no prior MI. EF 25%. RV normal.  - RHC  filling pressures were low . - NYHA III. Volume status stable.  No diuretics.  - I will not titrate HF meds due to ongoing dizziness. Consider entresto at next visit if dizziness resolved.    - Continue digoxin 0.125 mg daily. - Continue spironolactone 25 mg daily.  - Continue losartan 12.5 bid.  - Continue Coreg 3.125 mg bid.  - Plan to continue Life Vest until repeat ECHO.  -Zoll interogation- wearing vest > 23 hours. No VT. Plan to repeat ECHO in 3 months.  - I reviewed renal function on lab work from  10/21/19 3. SAH: Small volume SAH post-fall.  CT of head 10/21/19 showed SAH has resolved.  4. Rib Fracture  New fracture on left.  5. ETOH abuse Discussed complete cessation.   Plan to repeat ECHO in 3 months. Follow up in 2 weeks. Greater than 50% of the (total minutes 25) visit spent in counseling/coordination of care regarding the above.   Eryanna Regal NP-C  2:36 PM

## 2019-10-22 NOTE — Patient Instructions (Signed)
Please follow up with the [facility] in 2 weeks.  At the [facility], you and your health needs are our priority. As part of our continuing mission to provide you with exceptional heart care, we have created designated Provider Care Teams. These Care Teams include your primary Cardiologist (physician) and Advanced Practice Providers (APPs- Physician Assistants and Nurse Practitioners) who all work together to provide you with the care you need, when you need it.   You may see any of the following providers on your designated Care Team at your next follow up: Marland Kitchen Dr Arvilla Meres . Dr Marca Ancona . Tonye Becket, NP . Robbie Lis, PA . Karle Plumber, PharmD   Please be sure to bring in all your medications bottles to every appointment.

## 2019-10-22 NOTE — Progress Notes (Signed)
CSW met with pt during clinic visit to follow up regarding several pt needs identified during inpatient stay.  CSW has attempted multiple times to call pt but no answer- pt verified phone number and has now saved CSW number in his phone so he will know to answer.  Pt reports he now does have a scale and a pillbox so no longer needed.  Provided pt with information for [facility] in [place] for mental health follow up- they will provide pt with mental health assessment and counseling despite lack of insurance- informed pt he will need to walk in to get appt.  No further needs reported at this time.  Jorge Ny, LCSW Clinical Social Worker [facility] Desk#: [phone] Cell#: [phone]

## 2019-10-22 NOTE — Telephone Encounter (Signed)
No show, called and left message concerning missed apt today.  Included next apt date and time wiht contact information given.  Educated on no show policy in message as well.    Becky Sax, LPTA/CLT; [place] [phone]

## 2019-10-27 ENCOUNTER — Ambulatory Visit (HOSPITAL_COMMUNITY): Payer: Medicaid Other | Admitting: Physical Therapy

## 2019-10-27 ENCOUNTER — Encounter (HOSPITAL_COMMUNITY): Payer: Self-pay | Admitting: Physical Therapy

## 2019-10-27 ENCOUNTER — Other Ambulatory Visit: Payer: Self-pay

## 2019-10-27 DIAGNOSIS — R42 Dizziness and giddiness: Secondary | ICD-10-CM | POA: Diagnosis not present

## 2019-10-27 DIAGNOSIS — M542 Cervicalgia: Secondary | ICD-10-CM

## 2019-10-27 NOTE — Patient Instructions (Signed)
Access Code: [facility] URL: [email or web address] Date: 10/27/2019 Prepared by: Georges Lynch  Exercises Seated Gaze Stabilization with Head Rotation - 3 x daily - 7 x weekly - 1 sets - 10 reps Seated Horizontal Saccades - 3 x daily - 7 x weekly - 1 sets - 10 reps Seated Cervical Rotation AROM - 2 x daily - 7 x weekly - 1 sets - 10 reps Seated Cervical Retraction and Extension - 2 x daily - 7 x weekly - 1 sets - 10 reps Seated Cervical Retraction - 2 x daily - 7 x weekly - 1 sets - 10 reps

## 2019-10-27 NOTE — Therapy (Signed)
[facility] [address] [place], [place], [address] Phone: [phone]   Fax:  [phone]  Physical Therapy Treatment  Patient Details  Name: Andrew Hinton MRN: [id number] Date of Birth: 06-30-1976 Referring Provider (PT): Noralee Stain DO    Encounter Date: 10/27/2019  PT End of Session - 10/27/19 1044    Visit Number  2    Number of Visits  8    Date for PT Re-Evaluation  11/13/19    Authorization Type  Self pay    Authorization Time Period  10/20/19-11/13/19    Progress Note Due on Visit  8    PT Start Time  1038    PT Stop Time  1120    PT Time Calculation (min)  42 min    Equipment Utilized During Treatment  --   external heart monitor   Activity Tolerance  Patient tolerated treatment well    Behavior During Therapy  [facility] for tasks assessed/performed       Past Medical History:  Diagnosis Date  . Anxiety   . Asthma   . Depression   . Nerve damage     Past Surgical History:  Procedure Laterality Date  . APPENDECTOMY    . RIGHT/LEFT HEART CATH AND CORONARY ANGIOGRAPHY N/A 10/12/2019   Procedure: RIGHT/LEFT HEART CATH AND CORONARY ANGIOGRAPHY;  Surgeon: Laurey Morale, MD;  Location: [facility] INVASIVE CV LAB;  Service: Cardiovascular;  Laterality: N/A;    There were no vitals filed for this visit.  Subjective Assessment - 10/27/19 1042    Subjective  Patient says he had a lot of pain last week so be went back to hospital and they told him he had a broken rib, told him to take it easy. Patient says dizziness is much better but still has headaches and pain in LT side and neck on LT. Says neck pain is not as bad.    Pertinent History  temporal bone fracture 10/08/19, vertigo, hx of orthostatic episodes, has external heart monitor, LT rib fracture    Limitations  House hold activities;Lifting;Other (comment)   transfers, rolling over in bed, bending forward   Diagnostic tests  CT scan    Patient Stated Goals  decrease dizziness  and headaches    Currently in Pain?  Yes    Pain Score  8     Pain Location  Rib cage    Pain Orientation  Left    Pain Descriptors / Indicators  Aching;Sore    Pain Type  Acute pain    Pain Onset  1 to 4 weeks ago    Pain Frequency  Constant    Multiple Pain Sites  Yes    Pain Score  5    Pain Location  Head    Pain Orientation  Left    Pain Descriptors / Indicators  Aching    Pain Type  Acute pain    Pain Onset  1 to 4 weeks ago    Pain Frequency  Constant                       [facility] Adult PT Treatment/Exercise - 10/27/19 0001      Exercises   Exercises  Neck      Neck Exercises: Seated   Neck Retraction  10 reps    Cervical Rotation  Both;10 reps    Other Seated Exercise  cervical flexion/ extension AROM x 10       Manual  Therapy   Manual Therapy  Soft tissue mobilization    Manual therapy comments  Manuals performed separate from all other activity     Soft tissue mobilization  STM to LT upper trap, levator, medial scapular border using tennis ball       Vestibular Treatment/Exercise - 10/27/19 0001      Vestibular Treatment/Exercise   Gaze Exercises  Eye/Head Exercise Horizontal      Eye/Head Exercise Horizontal   Foot Position  seated    Comments  VOR 1 x 20; Horizontal saccades x 20             PT Education - 10/27/19 1150    Education Details  On HEP, self soft tissue mob with tennis ball, and bed set up with incline on pillows to decrease neck and shoulder pain    Person(s) Educated  Patient    Methods  Explanation;Handout    Comprehension  Verbalized understanding       PT Short Term Goals - 10/20/19 1030      PT SHORT TERM GOAL #1   Title  Patient will be independent with initial HEP to improve functional outcomes    Time  2    Period  Weeks    Status  New    Target Date  11/06/19        PT Long Term Goals - 10/20/19 1030      PT LONG TERM GOAL #1   Title  Patient will report at least 50% overall improvement in  subjective complaint of dizziness and headaches to indicate improvement in ability to perform ADLs.    Time  4    Period  Weeks    Status  New    Target Date  11/13/19      PT LONG TERM GOAL #2   Title  Patient will report 0 falls since starting therapy to indicate improved balance and safety awareness    Time  4    Period  Weeks    Status  New    Target Date  11/13/19      PT LONG TERM GOAL #3   Title  Patient will improve BUE MMT to at least 4+/5 throughout to improve ability to perform ADLs and indicate increased functional outcomes    Time  4    Period  Weeks    Status  New      PT LONG TERM GOAL #4   Title  Patient will improve LT cervical rotation to within 10 degrees of RT side to improve ability to scan environment/ turn while driving for improved safetey awareness and ability to perform ADLs.    Time  4    Period  Weeks    Status  New    Target Date  11/13/19            Plan - 10/27/19 1151    Clinical Impression Statement  Reviewed therapy goals, and issued HEP. Patient educated on purpose of vestibular exercise for reducing sx of dizziness. Added cervical mobility and manual STM to address cervical restrictions and pain. Patient tolerated session well overall, did note slight dizziness lasting a few seconds with VOR. Slight overcorrection with VOR noted toward RT x 2. Patient cued on performing cervical mobility exercise through pain free ROM. Patient noted improved mobility post manual treatment. Educated patient on self-mobs with tennis ball and trial of elevating head on several pillows in bed to improve sleep tolerance. Patient issued HEP handout.  Examination-Activity Limitations  Bathing;Bend;Squat;Bed Mobility;Stairs;Dressing;Transfers;Locomotion Level;Lift    Examination-Participation Restrictions  Cleaning;Community Activity;Yard Work    Stability/Clinical Decision Making  Stable/Uncomplicated    Rehab Potential  Good    PT Frequency  2x / week    PT  Duration  4 weeks    PT Treatment/Interventions  ADLs/Self Care Home Management;Aquatic Therapy;Biofeedback;Electrical Stimulation;Balance training;Manual techniques;Therapeutic exercise;Vestibular;Therapeutic activities;Taping;Functional mobility training;Orthotic Fit/Training;Stair training;Energy conservation;Dry needling;Joint Manipulations;Spinal Manipulations;Passive range of motion;Patient/family education;DME Instruction;Gait training;Neuromuscular re-education;Compression bandaging;Visual/perceptual remediation/compensation;Ultrasound    PT Next Visit Plan  Assess repsonse to HEP. Trial theragun for manual IASTM next visit. Progress to standing balance and postural strengthening as tolerated.    PT Home Exercise Plan  10/27/19: VOR, Saccades, cervical rotation/ flex/ ext ROM , chin tucks    Consulted and Agree with Plan of Care  Patient       Patient will benefit from skilled therapeutic intervention in order to improve the following deficits and impairments:  Decreased activity tolerance, Decreased strength, Pain, Decreased balance, Decreased mobility, Difficulty walking, Decreased range of motion, Dizziness, Impaired flexibility, Postural dysfunction  Visit Diagnosis: Dizziness and giddiness  Cervicalgia     Problem List Patient Active Problem List   Diagnosis Date Noted  . Syncope 10/09/2019  . AKI (acute kidney injury) ([place]) 10/09/2019  . Acute systolic CHF (congestive heart failure) ([place]) 10/09/2019  . Subarachnoid hemorrhage ([place]) 10/08/2019   12:04 PM, 10/27/19 Josue Hector PT DPT  Physical Therapist with [facility]  [phone] [place], [place], [address] Phone: [phone]   Fax:  [phone]  Name: Tallan Sandoz MRN: [id number] Date of Birth: 10-07-1975

## 2019-10-29 ENCOUNTER — Encounter (HOSPITAL_COMMUNITY): Payer: Self-pay | Admitting: Physical Therapy

## 2019-10-29 ENCOUNTER — Other Ambulatory Visit: Payer: Self-pay

## 2019-10-29 ENCOUNTER — Ambulatory Visit (HOSPITAL_COMMUNITY): Payer: Medicaid Other | Admitting: Physical Therapy

## 2019-10-29 DIAGNOSIS — R42 Dizziness and giddiness: Secondary | ICD-10-CM

## 2019-10-29 DIAGNOSIS — M542 Cervicalgia: Secondary | ICD-10-CM

## 2019-10-29 NOTE — Therapy (Signed)
[place], [place], [address] Phone: [phone]   Fax:  [phone]  Physical Therapy Treatment  Patient Details  Name: Andrew Hinton MRN: [id number] Date of Birth: 27-Mar-1976 Referring Provider (PT): Dessa Phi DO    Encounter Date: 10/29/2019  PT End of Session - 10/29/19 1047    Visit Number  3    Number of Visits  8    Date for PT Re-Evaluation  11/13/19    Authorization Type  Self pay    Authorization Time Period  10/20/19-11/13/19    Progress Note Due on Visit  8    PT Start Time  1035    PT Stop Time  1120    PT Time Calculation (min)  45 min    Equipment Utilized During Treatment  --   external heart monitor   Activity Tolerance  Patient tolerated treatment well    Behavior During Therapy  [facility] for tasks assessed/performed       Past Medical History:  Diagnosis Date  . Anxiety   . Asthma   . Depression   . Nerve damage     Past Surgical History:  Procedure Laterality Date  . APPENDECTOMY    . RIGHT/LEFT HEART CATH AND CORONARY ANGIOGRAPHY N/A 10/12/2019   Procedure: RIGHT/LEFT HEART CATH AND CORONARY ANGIOGRAPHY;  Surgeon: Larey Dresser, MD;  Location: [facility] CV LAB;  Service: Cardiovascular;  Laterality: N/A;    There were no vitals filed for this visit.  Subjective Assessment - 10/29/19 1039    Subjective  Patient says he is having a rough day. Says he woke up this AM and felt like his head wants to explode. Says his ribs are hurting very bad also. Says he has done nothing different that he normally does. Says he has been doing exercise at home, and going well but neck is sore. Says vestibular HEP going well, hasn't really been too dizzy, only had to take 1 pill for this since last time.    Pertinent History  temporal bone fracture 10/08/19, vertigo, hx of orthostatic episodes, has external heart monitor, LT rib fracture    Limitations  House hold activities;Lifting;Other (comment)    transfers, rolling over in bed, bending forward   Diagnostic tests  CT scan    Patient Stated Goals  decrease dizziness and headaches    Currently in Pain?  Yes    Pain Score  10-Worst pain ever    Pain Location  Rib cage    Pain Orientation  Left    Pain Descriptors / Indicators  Aching;Sore    Pain Type  Acute pain    Pain Onset  1 to 4 weeks ago    Pain Frequency  Constant    Pain Score  6    Pain Location  Neck    Pain Orientation  Left    Pain Descriptors / Indicators  Aching    Pain Type  Acute pain    Pain Onset  1 to 4 weeks ago    Pain Frequency  Constant                       [facility] Adult PT Treatment/Exercise - 10/29/19 0001      Neck Exercises: Seated   Neck Retraction  10 reps    Cervical Rotation  Both;10 reps    Other Seated Exercise  cervical flexion/ extension AROM x 10     Other Seated  Exercise  scap retractions 10 x 5"      Manual Therapy   Manual Therapy  Soft tissue mobilization    Manual therapy comments  Manuals performed separate from all other activity     Soft tissue mobilization  IASTM using thergun to bilateral upper trap, levator and periscapular muscles for pain and restrictions      Vestibular Treatment/Exercise - 10/29/19 0001      Eye/Head Exercise Horizontal   Foot Position  seated    Comments  VOR 1 x 20; VOR 2 x 20; Horizontal saccades x 20               PT Short Term Goals - 10/20/19 1030      PT SHORT TERM GOAL #1   Title  Patient will be independent with initial HEP to improve functional outcomes    Time  2    Period  Weeks    Status  New    Target Date  11/06/19        PT Long Term Goals - 10/20/19 1030      PT LONG TERM GOAL #1   Title  Patient will report at least 50% overall improvement in subjective complaint of dizziness and headaches to indicate improvement in ability to perform ADLs.    Time  4    Period  Weeks    Status  New    Target Date  11/13/19      PT LONG TERM GOAL #2   Title   Patient will report 0 falls since starting therapy to indicate improved balance and safety awareness    Time  4    Period  Weeks    Status  New    Target Date  11/13/19      PT LONG TERM GOAL #3   Title  Patient will improve BUE MMT to at least 4+/5 throughout to improve ability to perform ADLs and indicate increased functional outcomes    Time  4    Period  Weeks    Status  New      PT LONG TERM GOAL #4   Title  Patient will improve LT cervical rotation to within 10 degrees of RT side to improve ability to scan environment/ turn while driving for improved safetey awareness and ability to perform ADLs.    Time  4    Period  Weeks    Status  New    Target Date  11/13/19            Plan - 10/29/19 1134    Clinical Impression Statement  Initiated treatment with IASTM to cervical muscular to address complaint of increased pain. Patient noted feeling much better afterward, less pain, improved mobility. Patient able to perform HEP exercise with no increased pain. Patient had no sx of dizziness with VOR 1 and saccades, progressed patient to VOR 2. Patient did note increased difficulty with focused sight, not quite dizziness with progression to VOR 2. Educated patient on vestibular progressions and purpose in treatment. Educated patient on progressions to habituation activity once initial sx subside. Added scap squeezes for postural strengthening. Patient cued on performing all activity though pain free ROM.    Examination-Activity Limitations  Bathing;Bend;Squat;Bed Mobility;Stairs;Dressing;Transfers;Locomotion Level;Lift    Examination-Participation Restrictions  Cleaning;Community Activity;Yard Work    Stability/Clinical Decision Making  Stable/Uncomplicated    Rehab Potential  Good    PT Frequency  2x / week    PT Duration  4 weeks  PT Treatment/Interventions  ADLs/Self Care Home Management;Aquatic Therapy;Biofeedback;Electrical Stimulation;Balance training;Manual  techniques;Therapeutic exercise;Vestibular;Therapeutic activities;Taping;Functional mobility training;Orthotic Fit/Training;Stair training;Energy conservation;Dry needling;Joint Manipulations;Spinal Manipulations;Passive range of motion;Patient/family education;DME Instruction;Gait training;Neuromuscular re-education;Compression bandaging;Visual/perceptual remediation/compensation;Ultrasound    PT Next Visit Plan  Continue manual as needed. Progress to standing balance and postural strengthening as tolerated. Add upper trap stretch, try cervical isometrics if tolerated. Progress HEP as indicated    PT Home Exercise Plan  10/27/19: VOR, Saccades, cervical rotation/ flex/ ext ROM , chin tucks    Consulted and Agree with Plan of Care  Patient       Patient will benefit from skilled therapeutic intervention in order to improve the following deficits and impairments:  Decreased activity tolerance, Decreased strength, Pain, Decreased balance, Decreased mobility, Difficulty walking, Decreased range of motion, Dizziness, Impaired flexibility, Postural dysfunction  Visit Diagnosis: Dizziness and giddiness  Cervicalgia     Problem List Patient Active Problem List   Diagnosis Date Noted  . Syncope 10/09/2019  . AKI (acute kidney injury) ([facility]) 10/09/2019  . Acute systolic CHF (congestive heart failure) ([facility]) 10/09/2019  . Subarachnoid hemorrhage ([facility]) 10/08/2019   11:42 AM, 10/29/19 Georges Lynch PT DPT  Physical Therapist with [facility]  [phone]   [facility] [address] [place], [place], [address] Phone: [phone]   Fax:  [phone]  Name: Andrew Hinton MRN: [id number] Date of Birth: 1975/12/11

## 2019-11-03 ENCOUNTER — Telehealth (HOSPITAL_COMMUNITY): Payer: Self-pay

## 2019-11-03 ENCOUNTER — Ambulatory Visit (HOSPITAL_COMMUNITY): Payer: Medicaid Other | Admitting: Physical Therapy

## 2019-11-03 ENCOUNTER — Other Ambulatory Visit: Payer: Self-pay

## 2019-11-03 ENCOUNTER — Encounter (HOSPITAL_COMMUNITY): Payer: Self-pay | Admitting: Physical Therapy

## 2019-11-03 DIAGNOSIS — M542 Cervicalgia: Secondary | ICD-10-CM

## 2019-11-03 DIAGNOSIS — R42 Dizziness and giddiness: Secondary | ICD-10-CM | POA: Diagnosis not present

## 2019-11-03 NOTE — Patient Instructions (Signed)
Access Code: [email or web address] URL: [email or web address] Date: 11/03/2019 Prepared by: [facility] Graison Leinberger  Exercises Standing Shoulder Row with Anchored Resistance - 1 x daily - 7 x weekly - 2 sets - 15 reps Seated Upper Trapezius Stretch - 1 x daily - 7 x weekly - 1 sets - 3 reps - 20-30 seconds hold Supine Chin Tuck - 1 x daily - 7 x weekly - 2 sets - 10 reps - 2 seconds hold

## 2019-11-03 NOTE — Telephone Encounter (Signed)
No show, called and left message concerning missed apt today.  Included next apt date and time with contact information given.    Mertha Clyatt, LPTA/CLT; CBIS [phone]  

## 2019-11-03 NOTE — Therapy (Signed)
[facility] [address] [place], [place], [address] Phone: [phone]   Fax:  [phone]  Physical Therapy Treatment  Patient Details  Name: Andrew Hinton MRN: [id number] Date of Birth: Dec 03, 1975 Referring Provider (PT): Noralee Stain DO    Encounter Date: 11/03/2019  PT End of Session - 11/03/19 1523    Visit Number  4    Number of Visits  8    Date for PT Re-Evaluation  11/13/19    Authorization Type  Self pay    Authorization Time Period  10/20/19-11/13/19    Progress Note Due on Visit  8    PT Start Time  1433    PT Stop Time  1515    PT Time Calculation (min)  42 min    Equipment Utilized During Treatment  --   external heart monitor   Activity Tolerance  Patient tolerated treatment well    Behavior During Therapy  [facility] for tasks assessed/performed       Past Medical History:  Diagnosis Date  . Anxiety   . Asthma   . Depression   . Nerve damage     Past Surgical History:  Procedure Laterality Date  . APPENDECTOMY    . RIGHT/LEFT HEART CATH AND CORONARY ANGIOGRAPHY N/A 10/12/2019   Procedure: RIGHT/LEFT HEART CATH AND CORONARY ANGIOGRAPHY;  Surgeon: Laurey Morale, MD;  Location: [facility] INVASIVE CV LAB;  Service: Cardiovascular;  Laterality: N/A;    There were no vitals filed for this visit.  Subjective Assessment - 11/03/19 1436    Subjective  His headaches are about the same. Neck still hurts when moving. Dizziness is getting better and he only is taking one pill a day to help with that.    Pertinent History  temporal bone fracture 10/08/19, vertigo, hx of orthostatic episodes, has external heart monitor, LT rib fracture    Limitations  House hold activities;Lifting;Other (comment)   transfers, rolling over in bed, bending forward   Diagnostic tests  CT scan    Patient Stated Goals  decrease dizziness and headaches    Pain Score  8     Pain Location  Head    Pain Orientation  Left    Pain Onset  1 to 4 weeks ago     Pain Onset  1 to 4 weeks ago                       [facility] Adult PT Treatment/Exercise - 11/03/19 0001      Neck Exercises: Standing   Other Standing Exercises  shoulder Rows green band 2x15      Neck Exercises: Supine   Neck Retraction  10 reps    Neck Retraction Limitations  with verbal and tactile cueing      Manual Therapy   Manual Therapy  Soft tissue mobilization    Manual therapy comments  Manuals performed separate from all other activity     Soft tissue mobilization  STM to L cervical paraspinals, suboccipitals, UT             PT Education - 11/03/19 1453    Education Details  Patient educated on continuing HEP and using tennis ball for self mobs, living a healthy lifestyle, and mechanics of exercise    Person(s) Educated  Patient    Methods  Explanation;Handout    Comprehension  Verbalized understanding;Returned demonstration       PT Short Term Goals - 10/20/19 1030  PT SHORT TERM GOAL #1   Title  Patient will be independent with initial HEP to improve functional outcomes    Time  2    Period  Weeks    Status  New    Target Date  11/06/19        PT Long Term Goals - 10/20/19 1030      PT LONG TERM GOAL #1   Title  Patient will report at least 50% overall improvement in subjective complaint of dizziness and headaches to indicate improvement in ability to perform ADLs.    Time  4    Period  Weeks    Status  New    Target Date  11/13/19      PT LONG TERM GOAL #2   Title  Patient will report 0 falls since starting therapy to indicate improved balance and safety awareness    Time  4    Period  Weeks    Status  New    Target Date  11/13/19      PT LONG TERM GOAL #3   Title  Patient will improve BUE MMT to at least 4+/5 throughout to improve ability to perform ADLs and indicate increased functional outcomes    Time  4    Period  Weeks    Status  New      PT LONG TERM GOAL #4   Title  Patient will improve LT cervical  rotation to within 10 degrees of RT side to improve ability to scan environment/ turn while driving for improved safetey awareness and ability to perform ADLs.    Time  4    Period  Weeks    Status  New    Target Date  11/13/19            Plan - 11/03/19 1523    Clinical Impression Statement  Patient experiences reproduction of headache/neck and shoulder symptoms with STM to cervical paraspinals/suboccipitals on L side. He notes reduction in shoulder/neck pain and improved mobility following manual intervention. He tolerates upper trap stretch and requires some verbal cueing to decrease intensity of stretch if too painful. He requires frequent verbal and tactile cueing for reducing upper trap muscle activation with row for focus on scapular retraction strengthening for posture. He notes difficulty with seated cervical retraction and has difficulty performing with proper mechanics. Mechanics improve with verbal and tactile cueing with supine retraction. Patient will benefit from skilled physical therapy in order to improve function and reduce impairment.    Examination-Activity Limitations  Bathing;Bend;Squat;Bed Mobility;Stairs;Dressing;Transfers;Locomotion Level;Lift    Examination-Participation Restrictions  Cleaning;Community Activity;Yard Work    Stability/Clinical Decision Making  Stable/Uncomplicated    Rehab Potential  Good    PT Frequency  2x / week    PT Duration  4 weeks    PT Treatment/Interventions  ADLs/Self Care Home Management;Aquatic Therapy;Biofeedback;Electrical Stimulation;Balance training;Manual techniques;Therapeutic exercise;Vestibular;Therapeutic activities;Taping;Functional mobility training;Orthotic Fit/Training;Stair training;Energy conservation;Dry needling;Joint Manipulations;Spinal Manipulations;Passive range of motion;Patient/family education;DME Instruction;Gait training;Neuromuscular re-education;Compression bandaging;Visual/perceptual  remediation/compensation;Ultrasound    PT Next Visit Plan  Continue manual as needed. Progress to standing balance and postural strengthening as tolerated;  try cervical isometrics if tolerated. Progress HEP as indicated    PT Home Exercise Plan  10/27/19: VOR, Saccades, cervical rotation/ flex/ ext ROM , chin tucks 11/03/19 supine cervical retraction, UT stretch, Rows    Consulted and Agree with Plan of Care  Patient       Patient will benefit from skilled therapeutic intervention in order to improve the  following deficits and impairments:  Decreased activity tolerance, Decreased strength, Pain, Decreased balance, Decreased mobility, Difficulty walking, Decreased range of motion, Dizziness, Impaired flexibility, Postural dysfunction  Visit Diagnosis: Dizziness and giddiness  Cervicalgia     Problem List Patient Active Problem List   Diagnosis Date Noted  . Syncope 10/09/2019  . AKI (acute kidney injury) ([facility]) 10/09/2019  . Acute systolic CHF (congestive heart failure) ([facility]) 10/09/2019  . Subarachnoid hemorrhage ([facility]) 10/08/2019    3:30 PM, 11/03/19 Wyman Songster PT, DPT Physical Therapist at [facility]  [place] [facility] [address] [place], [place], [address] Phone: [phone]   Fax:  [phone]  Name: Andrew Hinton MRN: [id number] Date of Birth: 1976/05/04

## 2019-11-05 ENCOUNTER — Ambulatory Visit (HOSPITAL_COMMUNITY): Payer: Medicaid Other

## 2019-11-05 ENCOUNTER — Telehealth (HOSPITAL_COMMUNITY): Payer: Self-pay

## 2019-11-05 NOTE — Telephone Encounter (Signed)
No show, called and left message concerning [place] apt.  Included next apt date and time with contact information.  Included no show policy as well in this message.  Becky Sax, LPTA/CLT; [place] [phone]

## 2019-11-10 ENCOUNTER — Other Ambulatory Visit: Payer: Self-pay

## 2019-11-10 ENCOUNTER — Ambulatory Visit (HOSPITAL_COMMUNITY): Payer: Medicaid Other | Admitting: Physical Therapy

## 2019-11-10 ENCOUNTER — Encounter (HOSPITAL_COMMUNITY): Payer: Self-pay

## 2019-11-10 ENCOUNTER — Ambulatory Visit (HOSPITAL_COMMUNITY)
Admission: RE | Admit: 2019-11-10 | Discharge: 2019-11-10 | Disposition: A | Discharge status: Home / Self Care | Payer: Medicaid Other | Source: Ambulatory Visit | Attending: Cardiology | Admitting: Cardiology

## 2019-11-10 VITALS — BP 150/90 | HR 84 | Wt 201.0 lb

## 2019-11-10 DIAGNOSIS — I428 Other cardiomyopathies: Secondary | ICD-10-CM | POA: Diagnosis not present

## 2019-11-10 DIAGNOSIS — F1010 Alcohol abuse, uncomplicated: Secondary | ICD-10-CM | POA: Insufficient documentation

## 2019-11-10 DIAGNOSIS — J45909 Unspecified asthma, uncomplicated: Secondary | ICD-10-CM | POA: Insufficient documentation

## 2019-11-10 DIAGNOSIS — X58XXXA Exposure to other specified factors, initial encounter: Secondary | ICD-10-CM | POA: Diagnosis not present

## 2019-11-10 DIAGNOSIS — F329 Major depressive disorder, single episode, unspecified: Secondary | ICD-10-CM | POA: Diagnosis not present

## 2019-11-10 DIAGNOSIS — S2232XA Fracture of one rib, left side, initial encounter for closed fracture: Secondary | ICD-10-CM | POA: Diagnosis not present

## 2019-11-10 DIAGNOSIS — Z8249 Family history of ischemic heart disease and other diseases of the circulatory system: Secondary | ICD-10-CM | POA: Diagnosis not present

## 2019-11-10 DIAGNOSIS — Z79899 Other long term (current) drug therapy: Secondary | ICD-10-CM | POA: Diagnosis not present

## 2019-11-10 DIAGNOSIS — F419 Anxiety disorder, unspecified: Secondary | ICD-10-CM | POA: Diagnosis not present

## 2019-11-10 DIAGNOSIS — I5022 Chronic systolic (congestive) heart failure: Secondary | ICD-10-CM | POA: Diagnosis not present

## 2019-11-10 DIAGNOSIS — Z87891 Personal history of nicotine dependence: Secondary | ICD-10-CM | POA: Insufficient documentation

## 2019-11-10 DIAGNOSIS — R55 Syncope and collapse: Secondary | ICD-10-CM | POA: Diagnosis present

## 2019-11-10 LAB — BASIC METABOLIC PANEL
Anion gap: 12 (ref 5–15)
BUN: 17 mg/dL (ref 6–20)
CO2: 23 mmol/L (ref 22–32)
Calcium: 9.4 mg/dL (ref 8.9–10.3)
Chloride: 105 mmol/L (ref 98–111)
Creatinine, Ser: 1.13 mg/dL (ref 0.61–1.24)
GFR calc Af Amer: >60 mL/min (ref >60)
GFR calc non Af Amer: >60 mL/min (ref >60)
Glucose, Bld: 105 mg/dL — ABNORMAL HIGH (ref 70–99)
Potassium: 3.7 mmol/L (ref 3.5–5.1)
Sodium: 140 mmol/L (ref 135–145)

## 2019-11-10 LAB — DIGOXIN LEVEL: Digoxin Level: <0.2 ng/mL — ABNORMAL LOW (ref 0.8–2.0)

## 2019-11-10 MED ORDER — CARVEDILOL 3.125 MG PO TABS: 3.1250 mg | ORAL_TABLET | Freq: Two times a day (BID) | ORAL | 6 refills | Status: DC

## 2019-11-10 MED ORDER — MECLIZINE HCL 12.5 MG PO TABS: 12.5000 mg | ORAL_TABLET | Freq: Three times a day (TID) | ORAL | 6 refills | Status: DC | PRN

## 2019-11-10 MED ORDER — MECLIZINE HCL 25 MG PO TABS: 12.5000 mg | ORAL_TABLET | Freq: Three times a day (TID) | ORAL | 6 refills | Status: DC | PRN

## 2019-11-10 MED ORDER — ENTRESTO 24-26 MG PO TABS: 1.0000 | ORAL_TABLET | Freq: Two times a day (BID) | ORAL | 3 refills | Status: DC

## 2019-11-10 MED FILL — DIGOXIN 0.125 MG TABLET: 125 | 34 days supply | Qty: 34 | Fill #0

## 2019-11-10 MED FILL — SPIRONOLACTONE 25 MG TABS: 25 | 34 days supply | Qty: 34 | Fill #0

## 2019-11-10 NOTE — Addendum Note (Signed)
Encounter addended by: Burna Sis, LCSW on: 11/10/2019 4:23 PM  Actions taken: Clinical Note Signed, Flowsheet accepted

## 2019-11-10 NOTE — Patient Instructions (Addendum)
Stop Losartan  Start Entresto 24/26 mg Twice daily   Meclizine changed to 25 mg tablets, take 1/2 tab Three times a day AS NEEDED  Prescriptions for Entresto and Meclizine have been sent to [facility]  Prescriptions for Digoxin and Spironolactone have been sent to [facility] Out[facility] pharmacy   Prescription for Carvedilol has been sent to [facility], you should receive this in the mail in a few days  Labs done today, we will contact you for abnormal results  Please follow up with our heart failure pharmacist in 2-3 weeks  If you have any questions or concerns before your next appointment please send [place] a message through [place] or call our office at [phone].  At the [facility], you and your health needs are our priority. As part of our continuing mission to provide you with exceptional heart care, we have created designated Provider Care Teams. These Care Teams include your primary Cardiologist (physician) and Advanced Practice Providers (APPs- Physician Assistants and Nurse Practitioners) who all work together to provide you with the care you need, when you need it.   You may see any of the following providers on your designated Care Team at your next follow up: Marland Kitchen Dr Arvilla Meres . Dr Marca Ancona . Tonye Becket, NP . Robbie Lis, PA . Karle Plumber, PharmD   Please be sure to bring in all your medications bottles to every appointment.

## 2019-11-10 NOTE — Progress Notes (Signed)
CSW met with pt to assist with medication cost concerns.  Patient reports he received notice he was approved for [facility] and that medications were being sent out within 7-10 days but hasn't received them.  CSW called [facility] and confirmed he is eligible to receive formulary meds until 10/11/20- CSW had RN send eligible meds to their pharmacy.  Unfortunately many of his meds were not covered so sent heart meds under HF Fund to Outpatient pharmacy and sent other meds [facility] local [facility] with coupons to assist.  Pt started on Entresto today and CSW helped him fill out [employer] patient assistance app and provided it to pharmacist to complete and turn in.  No further needs reported at this time  Jorge Ny, [facility] Desk#: [phone] Cell#: [phone]

## 2019-11-10 NOTE — Progress Notes (Signed)
PCP: [facility] Primary Cardiologist: Dr Aundra Dubin   HPI: Andrew Hinton is a 44 year old with history of anxiety, asthma, and depression. Previously a heavy drinker and smoker until 2 years ago. Former heavy drinker.  No longer has drivers license due to multiple DWIs.   Admitted 10/08/19 after syncopal episode and dyspnea with exertion. Had SAH and rib fracture . Had ECHO that showed severely reduced EF. Had new onset acute systolic heart failure. Cath was negative for coronary disease, low filling pressures, and adequate cardiac output. Discharged with Life Vest. Started low dose HF meds. Discharged to home on 10/14/19  Was evaluated at [facility] ED on 3/10 due to dyspnea and rib pain. New rib fracture was noted on the left. SAH had resolved. CXR and CTA negative. He was discharged to home.   Seen in [facility] for post hospital f/u on 3/11. Overall feeling fair. Was having some dizziness that improved with meclizine. Stable from a CHF standpoint however his HF meds were not titrated due to ongoing dizziness.   He presents back for 2 week f/u to reassess and to try to titrate meds. Plan is to repeat echo in 3 months to reassess LVEF.   BP today is elevated at 150/90s. Continues w/ intermittent dizziness. Continues w/ meclizine and getting vestibular therapy which seems to be helping. No further syncope/ near syncope. Compliant w/ LifeVest. Device interrogated. No VT/VR. No resting dyspnea but gets short of breath walking long distances. NYHA Class II-III. Wt has been stable at home. No LEE   Cardiac Studies  LHC/RHC 10/12/19 No coronary disease RA 3 PCWP 3 CO 4.85 CI 2.3   ECHO 10/09/19 EF <20% Global HK, RV normal.  _________________________________  SH: Does not drive. No longer smokes. Stopped drinking 3 weeks ago. Lives with son, mother in law, and father in [employer]. Difficulty paying for medications. Work on cars full time.   FH: Mom and 2 sister have heart disease. He has not idea what kind  of heart problems. Mother deceased ? Cancer.   ROS: All systems negative except as listed in HPI, PMH and Problem List.  SH:  Social History   Socioeconomic History  . Marital status: Divorced    Spouse name: Not on file  . Number of children: 2  . Years of education: Not on file  . Highest education level: Not on file  Occupational History  . Not on file  Tobacco Use  . Smoking status: Former Smoker    Packs/day: 0.50    Types: Cigarettes    Quit date: 10/07/2017    Years since quitting: 2.0  . Smokeless tobacco: Never Used  Substance and Sexual Activity  . Alcohol use: Yes    Comment: occasionally   . Drug use: No  . Sexual activity: Not on file  Other Topics Concern  . Not on file  Social History Narrative  . Not on file   Social Determinants of Health   Financial Resource Strain: Medium Risk  . Difficulty of Paying Living Expenses: Somewhat hard  Food Insecurity: No Food Insecurity  . Worried About Charity fundraiser in the Last Year: Never true  . Ran Out of Food in the Last Year: Never true  Transportation Needs: No Transportation Needs  . Lack of Transportation (Medical): No  . Lack of Transportation (Non-Medical): No  Physical Activity:   . Days of Exercise per Week:   . Minutes of Exercise per Session:   Stress: Stress Concern Present  .  Feeling of Stress : Rather much  Social Connections:   . Frequency of Communication with Friends and Family:   . Frequency of Social Gatherings with Friends and Family:   . Attends Religious Services:   . Active Member of Clubs or Organizations:   . Attends Banker Meetings:   Marland Kitchen Marital Status:   Intimate Partner Violence:   . Fear of Current or Ex-Partner:   . Emotionally Abused:   Marland Kitchen Physically Abused:   . Sexually Abused:     FH: No family history on file.  Past Medical History:  Diagnosis Date  . Anxiety   . Asthma   . Depression   . Nerve damage     Current Outpatient Medications    Medication Sig Dispense Refill  . carvedilol (COREG) 3.125 MG tablet Take 1 tablet (3.125 mg total) by mouth 2 (two) times daily with a meal. 60 tablet 0  . losartan (COZAAR) 25 MG tablet Take 0.5 tablets (12.5 mg total) by mouth in the morning and at bedtime. 60 tablet 0  . meclizine (ANTIVERT) 12.5 MG tablet Take 1 tablet (12.5 mg total) by mouth 3 (three) times daily as needed for dizziness. 30 tablet 0  . digoxin (LANOXIN) 0.125 MG tablet Take 1 tablet (0.125 mg total) by mouth daily. (Patient not taking: Reported on 11/10/2019) 30 tablet 0  . oxyCODONE-acetaminophen (PERCOCET/ROXICET) 5-325 MG tablet Take 1-2 tablets by mouth every 4 (four) hours as needed for severe pain. (Patient not taking: Reported on 11/10/2019) 60 tablet 0  . spironolactone (ALDACTONE) 25 MG tablet Take 1 tablet (25 mg total) by mouth daily. (Patient not taking: Reported on 11/10/2019) 30 tablet 0   No current facility-administered medications for this encounter.   Wt Readings from Last 3 Encounters:  11/10/19 91.2 kg  10/22/19 89.5 kg  10/21/19 90.5 kg    Vitals:   11/10/19 1441  BP: (!) 150/90  Pulse: 84  SpO2: 97%  Weight: 91.2 kg    PHYSICAL EXAM: General:  Well appearing moderately obese WM. No respiratory difficulty HEENT: normal Neck: supple. no JVD. Carotids 2+ bilat; no bruits. No lymphadenopathy or thyromegaly appreciated. Cor: PMI nondisplaced. Regular rate & rhythm. No rubs, gallops or murmurs. Wearing lifevest  Lungs: clear Abdomen: soft, nontender, nondistended. No hepatosplenomegaly. No bruits or masses. Good bowel sounds. Extremities: no cyanosis, clubbing, rash, edema Neuro: alert & oriented x 3, cranial nerves grossly intact. moves all 4 extremities w/o difficulty. Affect pleasant.    ASSESS MENT & PLAN 1. Syncope:  Recently admitted with syncope 2/21. EF <20% on echo. LHC w/o significant coronary disease. No arhythmias detected during telemetry monitoring.  - He denies any  recurrence - Continue Lifevest until repeat ECHO.   2. Chronic Systolic HF:  Newly found, nonischemic cardiomyopathy. The LV is dilated with EF <20%, the RV appears normal. Given the LV dilation, suspect cardiomyopathy is not new (probably less likely to be a stress cardiomyopathy related to the subarachnoid hemorrhage). He was a heavy drinker at one point but not recently, possible ETOH cardiomyopathy but would have had to be long-standing and just now coming to light, as he reports no heavy drinking for 2 years. Cannot rule out viral cardiomyopathy. He has multiple family members with "heart disease" but he does not have details. Coronary angiography did not showed any significant CAD.MRI showed no infiltrative disease, no prior MI. EF 25%. RV normal.  RHC demonstrated low  filling pressures. - NYHA II-III. Volume status stable.  BP elevated 150/90 - Stop Losartan - Start Entresto 24-26 mg bid  - Continue digoxin 0.125 mg daily. Check Dig level today  - Continue spironolactone 25 mg daily.  - Continue Coreg 3.125 mg bid.  - Plan to continue Life Vest until repeat ECHO.  -Zoll interogation- wearing vest > 23 hours. No VT. Plan to repeat ECHO in 3 months.  - Check BMP today and again in 7-10 days 3. SAH: Small volume SAH post-fall.  - Repeat CT of head 10/21/19 showed SAH has resolved.  4. Rib Fracture  - New fracture on left.  5. ETOH abuse - Discussed complete cessation.   Return every 2-3 weeks for titration of HF meds, prior to repeat echo in 3 months.     Ayelet Gruenewald PA-C 2:46 PM

## 2019-11-11 ENCOUNTER — Telehealth (HOSPITAL_COMMUNITY): Payer: Self-pay | Admitting: Pharmacist

## 2019-11-11 NOTE — Telephone Encounter (Signed)
Sent in Manufacturer's Assistance application to [employer] for Entresto.    Application pending, will continue to follow.  Torii Royse, PharmD, BCPS, BCCP, CPP Heart Failure Clinic Pharmacist [phone]  

## 2019-11-12 ENCOUNTER — Telehealth (HOSPITAL_COMMUNITY): Payer: Self-pay | Admitting: Physical Therapy

## 2019-11-12 ENCOUNTER — Ambulatory Visit (HOSPITAL_COMMUNITY): Payer: Medicaid Other | Attending: Internal Medicine | Admitting: Physical Therapy

## 2019-11-12 NOTE — Telephone Encounter (Signed)
Called pateint about missed appointment, no answer. Voice mail box full. Cancelled all but 1 remaining appointment per 3 x no show policy, will DC pateint if next visit no show per non compliance.   11:40 AM, 11/12/19 Georges Lynch PT DPT  Physical Therapist with [facility]  [phone]

## 2019-11-16 ENCOUNTER — Ambulatory Visit: Payer: Medicaid Other | Admitting: Physician Assistant

## 2019-11-16 NOTE — Progress Notes (Signed)
   There were no vitals taken for this visit.   Subjective:    Patient ID: Sem Mccaughey, male    DOB: [birth year], 44 y.o.   MRN: [id number]  HPI: Camari Wisham is a 44 y.o. male presenting on 11/16/2019 for No chief complaint on file.   HPI   Pt had a negative covid 19 screening questionnaire.   Pt is a 43yoM who presents to clinic today to establish care.    Pt is followed by cardiology for CHF  Pt now states that he has medicaid.  Provider contacted [place] medicaid and confirmed that pt has full medicaid.    Discussed with pt that [facility] is not able to treat patients with medicaid and he will need to establish care with provider who accepts medicaid.  He states understanding.     Allergies and medications reviewed and updated.   Current Outpatient Medications:  .  carvedilol (COREG) 3.125 MG tablet, Take 1 tablet (3.125 mg total) by mouth 2 (two) times daily with a meal., Disp: 60 tablet, Rfl: 6 .  digoxin (LANOXIN) 0.125 MG tablet, Take 1 tablet (0.125 mg total) by mouth daily., Disp: 30 tablet, Rfl: 0 .  meclizine (ANTIVERT) 25 MG tablet, Take 0.5 tablets (12.5 mg total) by mouth 3 (three) times daily as needed for dizziness., Disp: 30 tablet, Rfl: 6 .  sacubitril-valsartan (ENTRESTO) 24-26 MG, Take 1 tablet by mouth 2 (two) times daily., Disp: 60 tablet, Rfl: 3 .  spironolactone (ALDACTONE) 25 MG tablet, Take 1 tablet (25 mg total) by mouth daily., Disp: 30 tablet, Rfl: 0 .  oxyCODONE-acetaminophen (PERCOCET/ROXICET) 5-325 MG tablet, Take 1-2 tablets by mouth every 4 (four) hours as needed for severe pain. (Patient not taking: Reported on 11/10/2019), Disp: 60 tablet, Rfl: 0    Review of Systems  Per HPI unless specifically indicated above     Objective:     Physical Exam Constitutional:      General: He is not in acute distress. HENT:     Head: Normocephalic and atraumatic.  Pulmonary:     Effort: Pulmonary effort is normal. No respiratory distress.   Neurological:     Mental Status: He is alert and oriented to person, place, and time.  Psychiatric:        Behavior: Behavior normal.         Assessment & Plan:    Encounter Diagnosis  Name Primary?  . Erroneous encounter - disregard Yes     Pt now states that he has medicaid.  Provider contacted [place] medicaid and confirmed that pt has full medicaid.    Discussed with pt that [facility] is not able to treat patients with medicaid and he will need to establish care with provider who accepts medicaid.  He states understanding.

## 2019-11-16 NOTE — Telephone Encounter (Signed)
Advanced Heart Failure Patient Advocate Encounter   Patient was approved to receive Entresto from [employer]. The VM was full so I was unable to leave any messages.   Patient ID: [id number] Effective dates: 11/13/19 through 11/12/20  Karle Plumber, PharmD, BCPS, BCCP, CPP Heart Failure Clinic Pharmacist [phone]

## 2019-11-17 ENCOUNTER — Encounter (HOSPITAL_COMMUNITY): Payer: Self-pay | Admitting: Physical Therapy

## 2019-11-17 ENCOUNTER — Ambulatory Visit (HOSPITAL_COMMUNITY): Payer: Medicaid Other | Admitting: Physical Therapy

## 2019-11-17 NOTE — Therapy (Signed)
[place], [place], [address] Phone: [phone]   Fax:  [phone]  Patient Details  Name: Andrew Hinton MRN: [id number] Date of Birth: [birth year] Referring Provider:  No ref. provider found  Encounter Date: 11/17/2019 PHYSICAL THERAPY DISCHARGE SUMMARY  Visits from Start of Care: 4  Current functional level related to goals / functional outcomes: Unable to reassess as patient did not return for follow up appointments   Remaining deficits: Unable to reassess as patient did not return for follow up appointments    Education / Equipment: Patient being DC per 3 x no show policy.  Plan:                                                    Patient goals were not met. Patient is being discharged due to not returning since the last visit.  ?????         10:48 AM, 11/17/19 Josue Hector PT DPT  Physical Therapist with [facility]  [phone] [place] [address] [place], [place], [address] Phone: [phone]   Fax:  [phone]

## 2019-11-19 ENCOUNTER — Encounter (HOSPITAL_COMMUNITY): Payer: Self-pay | Admitting: Physical Therapy

## 2019-11-20 ENCOUNTER — Telehealth (HOSPITAL_COMMUNITY): Payer: Self-pay | Admitting: Pharmacist

## 2019-11-20 NOTE — Progress Notes (Signed)
PCP: [facility] Primary Cardiologist: Dr Aundra Dubin   HPI:  [name] is a 44 year old with history of anxiety, asthma, and depression.Previously a heavy drinker and smoker until 2 years ago. Former heavy drinker. No longer has drivers license due to multiple DWIs.   Admitted 10/08/19 after syncopal episode and dyspnea with exertion. Had SAH and rib fracture . Had ECHO that showed severely reduced EF. Had new onset acute systolic heart failure. Cath was negative for coronary disease, low filling pressures, and adequate cardiac output. Discharged with Life Vest. Started low dose HF meds. Discharged to home on 10/14/19  Was evaluated at [facility] ED on 10/21/19 due to dyspnea and rib pain. New rib fracture was noted on the left. SAH had resolved. CXR and CTA negative. He was discharged to home.   Seen in [facility] for post hospital f/u on 10/22/19. Overall feeling fair. Was having some dizziness that improved with meclizine. Stable from a CHF standpoint however his HF meds were not titrated due to ongoing dizziness.   Recently presented to HF Clinic for follow up with Lyda Jester, PA-C. He presented back for 2 week f/u to reassess and to try to titrate HF medications (previously limited by dizziness). Plan was to repeat echo in 3 months to reassess LVEF. BP in clinic was elevated at 150/90s. Continued to have intermittent dizziness. Continued with meclizine and getting vestibular therapy which seemed to be helping. No further syncope/ near syncope. Compliant with LifeVest. Device interrogated. No VT/VR. No resting dyspnea but reported getting short of breath walking long distances. NYHA Class II-III. Weight had been stable at home. No LEE.  Today he returns to HF clinic for pharmacist medication titration. At last visit with PA-C, losartan was discontinued and Entresto 24/26 mg BID was initiated. Overall he is doing well with that change. Still having some dizziness, but this is tolerable when taking  meclizine. Dizziness unchanged since starting the Entresto. Noted some fatigue over the last few days, but feels like this is related to his difficulties sleeping and from working in the yard. Reports SOB with moderate activity. States he would get SOB when walking from his car into the clinic. His weight has been stable at home, ranging from 199-201 lbs. Weight is up 2 lbs from last clinic visit. He does not take any diuretic. He has woken up a few days per week over the last 2 weeks SOB, but believes this to be more anxiety related than fluid. No LEE or JVD. ReDs in clinic today 31%. Does not appear to be retaining fluid on exam. His appetite is ok. He follows a low sodium diet but does not fluid restrict (drinking 5-6 water bottles/day). He now has [employer]. Taking all medications as prescribed and tolerating all medications.    HF Medications: Carvedilol 3.125 mg BID Entresto 24/26 mg BID Spironolactone 25 mg daily Digoxin 0.125 mg daily  Has the patient been experiencing any side effects to the medications prescribed?  no  Does the patient have any problems obtaining medications due to transportation or finances?   Patient was recently approved for [facility] Medicaid.  Understanding of regimen: good Understanding of indications: good Potential of compliance: good Patient understands to avoid NSAIDs. Patient understands to avoid decongestants.    Pertinent Lab Values: . Serum creatinine 1.16, BUN 22, Potassium 4.8, Sodium 140, Digoxin <0.2 ng/mL (11/10/19)   Vital Signs: . Weight: 203 lbs (last clinic weight: 201 lbs) . Blood pressure: 146/96 (did not take  any of his morning medications) . Heart rate: 90   Assessment: 1. Syncope:  Recently admitted with syncope 2/21. EF <20% on echo. LHC w/o significant coronary disease. No arrhythmias detected during telemetry monitoring.  - He denies any recurrence - Continue Lifevest until repeat ECHO.  2. Chronic Systolic HF:  Newly  found, nonischemic cardiomyopathy. The LV is dilated with EF <20%, the RV appears normal. Given the LV dilation, suspect cardiomyopathy is not new (probably less likely to be a stress cardiomyopathy related to the subarachnoid hemorrhage). He was a heavy drinker at one point but not recently, possible ETOH cardiomyopathy but would have had to be long-standing and just now coming to light, as he reports no heavy drinking for 2 years. Cannot rule out viral cardiomyopathy. He has multiple family members with "heart disease" but he does not have details. Coronary angiography did not show any significant CAD.MRI showed no infiltrative disease, no prior MI. EF 25%. RV normal. RHC demonstrated lowfilling pressures. - NYHA II-III. Volume status appears stable, ReDS 31%, no LEE or JVD. - Vitals: BP elevated 146/96, but he did not take morning medications. HR 90. - Labs: Scr stable at 1.16, K 4.8. - Continue carvedilol 3.125 mg BID. - Increase Entresto to 49/51 mg BID. Repeat BMET in 3 weeks.  - Continue spironolactone 25 mg daily.  - Continue digoxin 0.125 mg daily. Digoxin level <0.2 ng/mL on 11/10/19. - Plan to continue Life Vest until repeat ECHO.  Plan to repeat ECHO in 3 months.  - Check BMP today and again in 7-10 days 3. SAH: Small volume SAH post-fall.  - Repeat CT of head 10/21/19 showed SAH has resolved.  4. ETOH abuse - Discussed complete cessation.    Plan: 1) Medication changes: Based on clinical presentation, vital signs and recent labs will increase Entresto to 49/51 mg BID. 2) Labs: Scr 1.16, K 4.8 3) Follow-up: 3 weeks with Pharmacy Clinic   Karle Plumber, PharmD, BCPS, BCCP, CPP Heart Failure Clinic Pharmacist [phone]

## 2019-11-20 NOTE — Telephone Encounter (Signed)
Advanced Heart Failure Patient Advocate Encounter  Prior Authorization for Sherryll Burger has been approved through [facility].    PA# [id number] Effective dates: 11/20/2019 - 11/19/2020  Patients co-pay is $3.00  Karle Plumber, PharmD, BCPS, BCCP, CPP Heart Failure Clinic Pharmacist [phone]

## 2019-12-03 ENCOUNTER — Ambulatory Visit (HOSPITAL_COMMUNITY)
Admission: RE | Admit: 2019-12-03 | Discharge: 2019-12-03 | Disposition: A | Discharge status: Home / Self Care | Payer: Medicaid Other | Source: Ambulatory Visit | Attending: Internal Medicine | Admitting: Internal Medicine

## 2019-12-03 ENCOUNTER — Other Ambulatory Visit: Payer: Self-pay

## 2019-12-03 VITALS — BP 146/96 | HR 90 | Wt 203.0 lb

## 2019-12-03 DIAGNOSIS — Z87891 Personal history of nicotine dependence: Secondary | ICD-10-CM | POA: Diagnosis not present

## 2019-12-03 DIAGNOSIS — I5022 Chronic systolic (congestive) heart failure: Secondary | ICD-10-CM | POA: Insufficient documentation

## 2019-12-03 DIAGNOSIS — F329 Major depressive disorder, single episode, unspecified: Secondary | ICD-10-CM | POA: Diagnosis not present

## 2019-12-03 DIAGNOSIS — F419 Anxiety disorder, unspecified: Secondary | ICD-10-CM | POA: Insufficient documentation

## 2019-12-03 DIAGNOSIS — F1010 Alcohol abuse, uncomplicated: Secondary | ICD-10-CM | POA: Insufficient documentation

## 2019-12-03 LAB — BASIC METABOLIC PANEL WITH GFR
Anion gap: 9 (ref 5–15)
BUN: 22 mg/dL — ABNORMAL HIGH (ref 6–20)
CO2: 25 mmol/L (ref 22–32)
Calcium: 9.3 mg/dL (ref 8.9–10.3)
Chloride: 106 mmol/L (ref 98–111)
Creatinine, Ser: 1.16 mg/dL (ref 0.61–1.24)
GFR calc Af Amer: >60 mL/min
GFR calc non Af Amer: >60 mL/min
Glucose, Bld: 95 mg/dL (ref 70–99)
Potassium: 4.8 mmol/L (ref 3.5–5.1)
Sodium: 140 mmol/L (ref 135–145)

## 2019-12-03 MED ORDER — CARVEDILOL 3.125 MG PO TABS: 3.1250 mg | ORAL_TABLET | Freq: Two times a day (BID) | ORAL | 6 refills | Status: DC

## 2019-12-03 MED ORDER — SACUBITRIL-VALSARTAN 49-51 MG PO TABS: 1.0000 | ORAL_TABLET | Freq: Two times a day (BID) | ORAL | 11 refills | Status: DC

## 2019-12-03 NOTE — Patient Instructions (Signed)
It was a pleasure seeing you today!  MEDICATIONS: -We are changing your medications today -Increase Entresto to 49/51 mg (1 tablet) twice daily. You may take 2 tablets of the 24/26 mg strength twice daily until you pick up the new strength.  -Call if you have questions about your medications.  LABS: -We will call you if your labs need attention.  NEXT APPOINTMENT: Return to clinic in 3 weeks with Pharmacy Clinic.  In general, to take care of your heart failure: -Limit your fluid intake to 2 Liters (half-gallon) per day.   -Limit your salt intake to ideally 2-3 grams (2000-3000 mg) per day. -Weigh yourself daily and record, and bring that "weight diary" to your next appointment.  (Weight gain of 2-3 pounds in 1 day typically means fluid weight.) -The medications for your heart are to help your heart and help you live longer.   -Please contact us before stopping any of your heart medications.  Call the clinic at [phone] with questions or to reschedule future appointments.  

## 2019-12-24 ENCOUNTER — Ambulatory Visit (HOSPITAL_COMMUNITY)
Admission: RE | Admit: 2019-12-24 | Discharge: 2019-12-24 | Disposition: A | Discharge status: Home / Self Care | Payer: Medicaid Other | Source: Ambulatory Visit | Attending: Cardiology | Admitting: Cardiology

## 2019-12-24 ENCOUNTER — Telehealth (HOSPITAL_COMMUNITY): Payer: Self-pay | Admitting: Pharmacist

## 2019-12-24 ENCOUNTER — Encounter (HOSPITAL_COMMUNITY): Payer: Self-pay

## 2019-12-24 ENCOUNTER — Other Ambulatory Visit: Payer: Self-pay

## 2019-12-24 VITALS — BP 136/78 | HR 82 | Ht 72.0 in | Wt 208.4 lb

## 2019-12-24 DIAGNOSIS — F1010 Alcohol abuse, uncomplicated: Secondary | ICD-10-CM | POA: Insufficient documentation

## 2019-12-24 DIAGNOSIS — Z87891 Personal history of nicotine dependence: Secondary | ICD-10-CM | POA: Insufficient documentation

## 2019-12-24 DIAGNOSIS — R55 Syncope and collapse: Secondary | ICD-10-CM | POA: Insufficient documentation

## 2019-12-24 DIAGNOSIS — R0602 Shortness of breath: Secondary | ICD-10-CM | POA: Insufficient documentation

## 2019-12-24 DIAGNOSIS — I428 Other cardiomyopathies: Secondary | ICD-10-CM | POA: Diagnosis not present

## 2019-12-24 DIAGNOSIS — Z7901 Long term (current) use of anticoagulants: Secondary | ICD-10-CM | POA: Diagnosis not present

## 2019-12-24 DIAGNOSIS — I38 Endocarditis, valve unspecified: Secondary | ICD-10-CM | POA: Insufficient documentation

## 2019-12-24 DIAGNOSIS — Z79899 Other long term (current) drug therapy: Secondary | ICD-10-CM | POA: Insufficient documentation

## 2019-12-24 DIAGNOSIS — I5022 Chronic systolic (congestive) heart failure: Secondary | ICD-10-CM | POA: Diagnosis not present

## 2019-12-24 LAB — BASIC METABOLIC PANEL
Anion gap: 12 (ref 5–15)
BUN: 18 mg/dL (ref 6–20)
CO2: 24 mmol/L (ref 22–32)
Calcium: 9.2 mg/dL (ref 8.9–10.3)
Chloride: 104 mmol/L (ref 98–111)
Creatinine, Ser: 1.11 mg/dL (ref 0.61–1.24)
GFR calc Af Amer: >60 mL/min (ref >60)
GFR calc non Af Amer: >60 mL/min (ref >60)
Glucose, Bld: 108 mg/dL — ABNORMAL HIGH (ref 70–99)
Potassium: 4.6 mmol/L (ref 3.5–5.1)
Sodium: 140 mmol/L (ref 135–145)

## 2019-12-24 MED ORDER — SPIRONOLACTONE 25 MG PO TABS: 25.0000 mg | ORAL_TABLET | Freq: Every day | ORAL | 3 refills | Status: DC

## 2019-12-24 MED ORDER — DIGOXIN 125 MCG PO TABS: 0.1250 mg | ORAL_TABLET | Freq: Every day | ORAL | 3 refills | Status: DC

## 2019-12-24 MED ORDER — FARXIGA 10 MG PO TABS: 10.0000 mg | ORAL_TABLET | Freq: Every day | ORAL | 11 refills | Status: DC

## 2019-12-24 NOTE — Telephone Encounter (Signed)
Patient Advocate Encounter   Received notification from [facility] Medicaid that prior authorization for [name] is required.   PA submitted on [place] [employer] Confirmation #: [id number] Recipient ID: [id number] T Status is pending   Will continue to follow.  Karle Plumber, PharmD, BCPS, BCCP, CPP Heart Failure Clinic Pharmacist [phone]

## 2019-12-24 NOTE — Progress Notes (Signed)
PCP: [facility] Primary Cardiologist: Dr Aundra Dubin   HPI:  [name] is a 44 year old with history of anxiety, asthma, and depression.Previously a heavy drinker and smoker until 2 years ago. Former heavy drinker. No longer has drivers license due to multiple DWIs.   Admitted 10/08/19 after syncopal episode and dyspnea with exertion. Had SAH and rib fracture . Had ECHO that showed severely reduced EF. Had new onset acute systolic heart failure. Cath was negative for coronary disease, low filling pressures, and adequate cardiac output. Discharged with Life Vest. Started low dose HF meds. Discharged to home on 10/14/19  [facility]21due to dyspnea and rib pain. New rib fracture was noted on the left. SAH had resolved. CXR and CTA negative. He was discharged to home.   [facility] for post hospital f/u on 10/22/19.Overall feeling fair.Was having some dizziness that improvedwith meclizine. Stable from a CHF standpoint however his HF meds were not titrated due to ongoing dizziness.   Recently presented to HF Clinic for follow up with Lyda Jester, PA-C. He presented back for 2 week f/u to reassess and to try to titrate HF medications (previously limited by dizziness). Plan to repeat echo in 3 months to reassess LVEF. BP in clinic was elevated at 150/90s.Continued to have intermittent dizziness. Continued with meclizine and getting vestibular therapy which seemed to be helping. No further syncope/ near syncope. Compliant with LifeVest. Device interrogated. No VT/VR. No resting dyspnea but reported getting short of breath walking long distances. NYHA Class II-III. Weight had been stable at home. No LEE.  At last heart failure clinic with Audry Riles, PharmD, he was doing well with his newly started [facility]. He was stilll having some dizziness, but was tolerable when taking meclizine. Dizziness unchanged since starting the Entresto. Noted some fatigue over the last  few days, but felt like this was related to his difficulties sleeping and from working in the yard. Reported SOB with moderate activity. Stated he would get SOB when walking from his car into the clinic. His weight had been stable at home, ranging from 199-201 lbs. Weight was up 2 lbs from last clinic visit. He does not take any diuretic. He had woken up a few days per week over the last 2 weeks SOB, but believed this to be more anxiety related than fluid. No LEE or JVD. ReDs in clinic was 31%. Did not appear to be retaining fluid on exam. His appetite was ok. He follows a low sodium diet but does not fluid restrict (drinking 5-6 water bottles/day). He has [place] [employer]. He endorsed compliance with all medications as prescribed and was tolerating all medications.   Today he returns to HF clinic for pharmacist medication titration. At last visit with pharmacist, Delene Loll was increased to 49/51 mg BID. Symptomatically, he is doing well. He denies lightheadedness and fatigue but endorses some dizziness when he lays down and when he looks up. He takes meclizine for his dizziness which seems to resolve his symptoms. No chest pain or palpitations. Breathing has been stable. He is only able to walk ~50 yards before becoming SOB. He is able to complete all ADLs but is otherwise not very active. He weighs himself at home and his normal range is 200-204 lbs. He is up 5 lbs from last visit today. He does not take diuretics at home. No LEE. He does endorse that he has been having some "crazy dreams" lately and this causes him to wake up  with SOB and panic but he does not think this is related to fluid overload. He sleeps directly on 2 pillows. His appetite has been about the same lately. He adheres to a low-salt diet and has reduced his water intake over the last couple weeks. Also, he mentioned that he ran out of refills on his spironolactone and digoxin on 12/20/19. Sent refills for these medications in clinic today.  He had not taken his morning medication prior to the visit today.     HF Medications: Carvedilol 3.125 mg BID Entresto 49/51 mg BID Spironolactone 25 mg daily Digoxin 0.125 mg daily  Has the patient been experiencing any side effects to the medications prescribed?  no  Does the patient have any problems obtaining medications due to transportation or finances?  Patient was recently approved for [facility] Medicaid.  Understanding of regimen: good Understanding of indications: good Potential of compliance: good Patient understands to avoid NSAIDs. Patient understands to avoid decongestants.  Pertinent Lab Values:  Serum creatinine 1.11, BUN 18, Potassium 4.6, Sodium 140, Digoxin <0.2 ng/mL (11/10/19)   Vital Signs:  Weight: 208.4 lbs (last clinic weight: 203 lbs)  Blood pressure: 136/78   Heart rate: 82  Assessment: 1. Syncope: Recently admitted with syncope 2/21. EF <20% on echo. LHC w/o significant coronary disease. No arrhythmias detected during telemetry monitoring - He denies any recurrence  - Continue Lifevest until repeat ECHO  2. Chronic Systolic HF: Newly found, nonischemic cardiomyopathy. The LV is dilated with EF <20%, the RV appears normal. Given the LV dilation, suspect cardiomyopathy is not new (probably less likely to be a stress cardiomyopathy related to the subarachnoid hemorrhage). He was a heavy drinker at one point but not recently, possible ETOH cardiomyopathy but would have had to be long-standing and just now coming to light, as he reports no heavy drinking for 2 years. Cannot rule out viral cardiomyopathy. He has multiple family members with "heart disease" but he does not have details. Coronary angiographydid notshow anysignificant CAD.MRIshowedno infiltrative disease, no prior MI. EF 25%. RV normal.RHC demonstrated lowfilling pressures. - NYHAII-III. Volume status appears stable, no LEE or JVD on exam - Vitals: BP 136/76, HR 82 - Labs: Scr  1.11, K 4.6 - Continue carvedilol 3.125 mg BID - Continue Entresto 49/51 mg BID - Continue spironolactone 25 mg daily  - Start dapagliflozin [name]) 10mg  daily. Gave him a 30-day free coupon and will complete medicaid prior authorization. - Continue digoxin 0.125 mg daily.Digoxin level <0.2 ng/mL on 11/10/19  - Plan to continue Life Vest until repeat ECHO. Plan to repeat ECHO in 3 months  3. SAH: Small volume SAH post-fall. - RepeatCT of head 10/21/19 showed SAH has resolved   4. ETOH abuse -Discussed complete cessation  Plan: 1) Medication changes: Based on clinical presentation, vital signs and recent labs will start dapagliflozin 12/21/19) 10mg  daily 2) Labs: Scr 1.11, K 4.6. Re-check BMET in 3-4 weeks 3) Follow-up: 3 weeks with APP. Need to schedule f/u ECHO with Dr. [name], PharmD PGY1 Ambulatory Care Pharmacy Resident  , PharmD, BCPS, [facility], CPP Heart Failure Clinic Pharmacist [phone]

## 2019-12-24 NOTE — Telephone Encounter (Signed)
Advanced Heart Failure Patient Advocate Encounter  Prior Authorization for [name] has been approved.    PA# [id number] Effective dates: 12/24/2019 - 12/18/2020  Patients co-pay is $3.00  Karle Plumber, PharmD, BCPS, BCCP, CPP Heart Failure Clinic Pharmacist [phone]

## 2019-12-24 NOTE — Patient Instructions (Signed)
It was a pleasure seeing you today!  MEDICATIONS: -We are changing your medications today -Start dapagliflozin [name]) 10mg  (1 tablet) by mouth daily -Call if you have questions about your medications.  LABS: -We will call you if your labs need attention.  NEXT APPOINTMENT: Return to clinic in 3 weeks with PA.  In general, to take care of your heart failure: -Limit your fluid intake to 2 Liters (half-gallon) per day.   -Limit your salt intake to ideally 2-3 grams (2000-3000 mg) per day. -Weigh yourself daily and record, and bring that "weight diary" to your next appointment.  (Weight gain of 2-3 pounds in 1 day typically means fluid weight.) -The medications for your heart are to help your heart and help you live longer.   -Please contact before stopping any of your heart medications.  Call the clinic at [phone] with questions or to reschedule future appointments.

## 2020-01-13 NOTE — Progress Notes (Signed)
PCP: [facility] Primary Cardiologist: Dr Shirlee Latch   HPI: Mr Byrns is a 44 year old with history of anxiety, asthma, and depression. Previously a heavy drinker and smoker until 2 years ago. Former heavy drinker.  No longer has drivers license due to multiple DWIs.   Admitted 10/08/19 after syncopal episode and dyspnea with exertion. Had SAH and rib fracture . Had ECHO that showed severely reduced EF. Had new onset acute systolic heart failure. Cath was negative for coronary disease, low filling pressures, and adequate cardiac output. Discharged with Life Vest. Started low dose HF meds. Discharged to home on 10/14/19  Was evaluated at [facility] ED on 3/10 due to dyspnea and rib pain. New rib fracture was noted on the left. SAH had resolved. CXR and CTA negative. He was discharged to home.   Seen in [facility] for post hospital f/u on 3/11. Overall feeling fair. Was having some dizziness that improved with meclizine. Stable from a CHF standpoint however his HF meds were not titrated due to ongoing dizziness.   Today he returns for HF follow up. Last visit entresto was started. Overall feeling fine. Denies SOB/PND/Orthopnea. No longer dizzy. Appetite ok. No fever or chills. Weight at home 203-204  Pounds. Continues to wear Life Vest. Taking all medications. Has not had any alcohol in 3 months. No longer smoking.     Zoll Interrogation: Walking > 9000 steps. Average heart rate 93. Wear time > 23 hours. No alerts.   Cardiac Studies  LHC/RHC 10/12/19 No coronary disease RA 3 PCWP 3 CO 4.85 CI 2.3   ECHO 10/09/19 EF <20% Global HK, RV normal.  _________________________________  SH: Does not drive. No longer smokes. Stopped drinking 3 weeks ago. Lives with son, mother in law, and father in Social worker. Difficulty paying for medications. Work on cars full time.   FH: Mom and 2 sister have heart disease. He has not idea what kind of heart problems. Mother deceased ? Cancer.   ROS: All systems negative  except as listed in HPI, PMH and Problem List.  SH:  Social History   Socioeconomic History  . Marital status: Divorced    Spouse name: Not on file  . Number of children: 2  . Years of education: Not on file  . Highest education level: Not on file  Occupational History  . Not on file  Tobacco Use  . Smoking status: Former Smoker    Packs/day: 0.50    Types: Cigarettes    Quit date: 10/07/2017    Years since quitting: 2.2  . Smokeless tobacco: Never Used  Substance and Sexual Activity  . Alcohol use: Yes    Comment: occasionally   . Drug use: No  . Sexual activity: Not on file  Other Topics Concern  . Not on file  Social History Narrative  . Not on file   Social Determinants of Health   Financial Resource Strain: Medium Risk  . Difficulty of Paying Living Expenses: Somewhat hard  Food Insecurity: No Food Insecurity  . Worried About Programme researcher, broadcasting/film/video in the Last Year: Never true  . Ran Out of Food in the Last Year: Never true  Transportation Needs: No Transportation Needs  . Lack of Transportation (Medical): No  . Lack of Transportation (Non-Medical): No  Physical Activity:   . Days of Exercise per Week:   . Minutes of Exercise per Session:   Stress: Stress Concern Present  . Feeling of Stress : Rather much  Social Connections:   .  Frequency of Communication with Friends and Family:   . Frequency of Social Gatherings with Friends and Family:   . Attends Religious Services:   . Active Member of Clubs or Organizations:   . Attends Banker Meetings:   Marland Kitchen Marital Status:   Intimate Partner Violence:   . Fear of Current or Ex-Partner:   . Emotionally Abused:   Marland Kitchen Physically Abused:   . Sexually Abused:     FH: No family history on file.  Past Medical History:  Diagnosis Date  . Anxiety   . Asthma   . Depression   . Nerve damage     Current Outpatient Medications  Medication Sig Dispense Refill  . carvedilol (COREG) 3.125 MG tablet Take 1  tablet (3.125 mg total) by mouth 2 (two) times daily with a meal. 60 tablet 6  . dapagliflozin propanediol (FARXIGA) 10 MG TABS tablet Take 10 mg by mouth daily before breakfast. 30 tablet 11  . digoxin (LANOXIN) 0.125 MG tablet Take 1 tablet (0.125 mg total) by mouth daily. 90 tablet 3  . meclizine (ANTIVERT) 25 MG tablet Take 0.5 tablets (12.5 mg total) by mouth 3 (three) times daily as needed for dizziness. 30 tablet 6  . sacubitril-valsartan (ENTRESTO) 49-51 MG Take 1 tablet by mouth 2 (two) times daily. 60 tablet 11  . spironolactone (ALDACTONE) 25 MG tablet Take 1 tablet (25 mg total) by mouth daily. 90 tablet 3   No current facility-administered medications for this encounter.   Wt Readings from Last 3 Encounters:  01/14/20 93.4 kg (205 lb 12.8 oz)  12/24/19 94.5 kg (208 lb 6.4 oz)  12/03/19 92.1 kg (203 lb)    Vitals:   01/14/20 1143  BP: 136/88  Pulse: 86  SpO2: 97%  Weight: 93.4 kg (205 lb 12.8 oz)    General:  Well appearing. No resp difficulty. Life Vest on.  HEENT: normal Neck: supple. no JVD. Carotids 2+ bilat; no bruits. No lymphadenopathy or thryomegaly appreciated. Cor: PMI nondisplaced. Regular rate & rhythm. No rubs, gallops or murmurs. Wars Technical sales engineer  Lungs: clear Abdomen: soft, nontender, nondistended. No hepatosplenomegaly. No bruits or masses. Good bowel sounds. Extremities: no cyanosis, clubbing, rash, edema Neuro: alert & orientedx3, cranial nerves grossly intact. moves all 4 extremities w/o difficulty. Affect pleasant      ASSESS MENT & PLAN 1. Syncope:  Recently admitted with syncope 2/21. EF <20% on echo. LHC w/o significant coronary disease. No arhythmias detected during telemetry monitoring.  - He denies any recurrence - Continue Lifevest until repeat ECHO.   2. Chronic Systolic HF:  Newly found, nonischemic cardiomyopathy. The LV is dilated with EF <20%, the RV appears normal. Given the LV dilation, suspect cardiomyopathy is not new (probably  less likely to be a stress cardiomyopathy related to the subarachnoid hemorrhage). He was a heavy drinker at one point but not recently, possible ETOH cardiomyopathy but would have had to be long-standing and just now coming to light, as he reports no heavy drinking for 2 years. Cannot rule out viral cardiomyopathy. He has multiple family members with "heart disease" but he does not have details. Coronary angiography did not showed any significant CAD.MRI showed no infiltrative disease, no prior MI. EF 25%. RV normal.  RHC demonstrated low  filling pressures. NYHA II. Volume status stable. Does not need lasix.  - Continue Entresto 49-51 mg twice a day.   - Continue digoxin 0.125 mg daily.  - Continue spironolactone 25 mg daily.  - Increase  coreg to 6.25 mg twice a day.   - Continue farxiga 10 mg daily.  - Check BMET  - Plan to continue Life Vest until repeat ECHO. Discussed Zoll interrogation.  -Set up for repeat ECHO.  3. SAH: Small volume SAH post-fall.  - Repeat CT of head 10/21/19 showed SAH has resolved.  4. Rib Fracture  - New fracture on left.  5. ETOH abuse - No longer drinking alcohol. Congratulated.   Follow up in 3-4 weeks with Dr Aundra Dubin and an ECHO.   Raihan Kimmel NP-C  11:44 AM

## 2020-01-14 ENCOUNTER — Encounter (HOSPITAL_COMMUNITY): Payer: Self-pay

## 2020-01-14 ENCOUNTER — Other Ambulatory Visit: Payer: Self-pay

## 2020-01-14 ENCOUNTER — Ambulatory Visit (HOSPITAL_COMMUNITY)
Admission: RE | Admit: 2020-01-14 | Discharge: 2020-01-14 | Disposition: A | Discharge status: Home / Self Care | Payer: Medicaid Other | Source: Ambulatory Visit | Attending: Adult Health | Admitting: Adult Health

## 2020-01-14 VITALS — BP 136/88 | HR 86 | Wt 205.8 lb

## 2020-01-14 DIAGNOSIS — Z87891 Personal history of nicotine dependence: Secondary | ICD-10-CM | POA: Diagnosis not present

## 2020-01-14 DIAGNOSIS — R55 Syncope and collapse: Secondary | ICD-10-CM | POA: Diagnosis not present

## 2020-01-14 DIAGNOSIS — W19XXXA Unspecified fall, initial encounter: Secondary | ICD-10-CM | POA: Insufficient documentation

## 2020-01-14 DIAGNOSIS — Z79899 Other long term (current) drug therapy: Secondary | ICD-10-CM | POA: Diagnosis not present

## 2020-01-14 DIAGNOSIS — I38 Endocarditis, valve unspecified: Secondary | ICD-10-CM

## 2020-01-14 DIAGNOSIS — J45909 Unspecified asthma, uncomplicated: Secondary | ICD-10-CM | POA: Diagnosis not present

## 2020-01-14 DIAGNOSIS — F1010 Alcohol abuse, uncomplicated: Secondary | ICD-10-CM

## 2020-01-14 DIAGNOSIS — S066X9A Traumatic subarachnoid hemorrhage with loss of consciousness of unspecified duration, initial encounter: Secondary | ICD-10-CM | POA: Insufficient documentation

## 2020-01-14 DIAGNOSIS — F329 Major depressive disorder, single episode, unspecified: Secondary | ICD-10-CM | POA: Diagnosis not present

## 2020-01-14 DIAGNOSIS — Z7984 Long term (current) use of oral hypoglycemic drugs: Secondary | ICD-10-CM | POA: Insufficient documentation

## 2020-01-14 DIAGNOSIS — S2232XA Fracture of one rib, left side, initial encounter for closed fracture: Secondary | ICD-10-CM | POA: Insufficient documentation

## 2020-01-14 DIAGNOSIS — Z8249 Family history of ischemic heart disease and other diseases of the circulatory system: Secondary | ICD-10-CM | POA: Diagnosis not present

## 2020-01-14 DIAGNOSIS — F419 Anxiety disorder, unspecified: Secondary | ICD-10-CM | POA: Diagnosis not present

## 2020-01-14 DIAGNOSIS — I428 Other cardiomyopathies: Secondary | ICD-10-CM | POA: Diagnosis not present

## 2020-01-14 DIAGNOSIS — I5022 Chronic systolic (congestive) heart failure: Secondary | ICD-10-CM | POA: Diagnosis present

## 2020-01-14 LAB — BASIC METABOLIC PANEL
Anion gap: 10 (ref 5–15)
BUN: 19 mg/dL (ref 6–20)
CO2: 26 mmol/L (ref 22–32)
Calcium: 9.4 mg/dL (ref 8.9–10.3)
Chloride: 105 mmol/L (ref 98–111)
Creatinine, Ser: 1.15 mg/dL (ref 0.61–1.24)
GFR calc Af Amer: >60 mL/min (ref >60)
GFR calc non Af Amer: >60 mL/min (ref >60)
Glucose, Bld: 123 mg/dL — ABNORMAL HIGH (ref 70–99)
Potassium: 4.7 mmol/L (ref 3.5–5.1)
Sodium: 141 mmol/L (ref 135–145)

## 2020-01-14 MED ORDER — CARVEDILOL 6.25 MG PO TABS: 6.2500 mg | ORAL_TABLET | Freq: Two times a day (BID) | ORAL | 3 refills | Status: DC

## 2020-01-14 NOTE — Patient Instructions (Signed)
INCREASE Carvedilol to 6.25mg  twice daily  Routine lab work today. Will notify you of abnormal results   Follow up and echo in 3-4 weeks  Do the following things EVERYDAY: 1) Weigh yourself in the morning before breakfast. Write it down and keep it in a log. 2) Take your medicines as prescribed 3) Eat low salt foods--Limit salt (sodium) to 2000 mg per day.  4) Stay as active as you can everyday 5) Limit all fluids for the day to less than 2 liters

## 2020-02-04 ENCOUNTER — Other Ambulatory Visit (HOSPITAL_COMMUNITY): Payer: Self-pay | Admitting: *Deleted

## 2020-02-04 DIAGNOSIS — I5022 Chronic systolic (congestive) heart failure: Secondary | ICD-10-CM

## 2020-02-05 ENCOUNTER — Other Ambulatory Visit: Payer: Self-pay

## 2020-02-05 ENCOUNTER — Encounter (HOSPITAL_COMMUNITY): Payer: Self-pay | Admitting: Cardiology

## 2020-02-05 ENCOUNTER — Ambulatory Visit (HOSPITAL_COMMUNITY)
Admission: RE | Admit: 2020-02-05 | Discharge: 2020-02-05 | Disposition: A | Discharge status: Home / Self Care | Payer: Medicaid Other | Source: Ambulatory Visit | Attending: Cardiology | Admitting: Cardiology

## 2020-02-05 ENCOUNTER — Ambulatory Visit (HOSPITAL_BASED_OUTPATIENT_CLINIC_OR_DEPARTMENT_OTHER)
Admission: RE | Admit: 2020-02-05 | Discharge: 2020-02-05 | Disposition: A | Discharge status: Home / Self Care | Payer: Medicaid Other | Source: Ambulatory Visit | Attending: Cardiology | Admitting: Cardiology

## 2020-02-05 VITALS — BP 138/98 | HR 78 | Wt 203.0 lb

## 2020-02-05 DIAGNOSIS — R55 Syncope and collapse: Secondary | ICD-10-CM | POA: Diagnosis not present

## 2020-02-05 DIAGNOSIS — Z7189 Other specified counseling: Secondary | ICD-10-CM

## 2020-02-05 DIAGNOSIS — Z79899 Other long term (current) drug therapy: Secondary | ICD-10-CM | POA: Diagnosis not present

## 2020-02-05 DIAGNOSIS — Z87820 Personal history of traumatic brain injury: Secondary | ICD-10-CM | POA: Diagnosis not present

## 2020-02-05 DIAGNOSIS — I5022 Chronic systolic (congestive) heart failure: Secondary | ICD-10-CM

## 2020-02-05 DIAGNOSIS — I428 Other cardiomyopathies: Secondary | ICD-10-CM | POA: Diagnosis not present

## 2020-02-05 DIAGNOSIS — Z7984 Long term (current) use of oral hypoglycemic drugs: Secondary | ICD-10-CM | POA: Insufficient documentation

## 2020-02-05 DIAGNOSIS — F419 Anxiety disorder, unspecified: Secondary | ICD-10-CM | POA: Insufficient documentation

## 2020-02-05 DIAGNOSIS — Z87891 Personal history of nicotine dependence: Secondary | ICD-10-CM | POA: Diagnosis not present

## 2020-02-05 DIAGNOSIS — Z95811 Presence of heart assist device: Secondary | ICD-10-CM | POA: Insufficient documentation

## 2020-02-05 DIAGNOSIS — F1011 Alcohol abuse, in remission: Secondary | ICD-10-CM | POA: Insufficient documentation

## 2020-02-05 DIAGNOSIS — Z8249 Family history of ischemic heart disease and other diseases of the circulatory system: Secondary | ICD-10-CM | POA: Insufficient documentation

## 2020-02-05 DIAGNOSIS — I5043 Acute on chronic combined systolic (congestive) and diastolic (congestive) heart failure: Secondary | ICD-10-CM

## 2020-02-05 LAB — BASIC METABOLIC PANEL
Anion gap: 10 (ref 5–15)
BUN: 16 mg/dL (ref 6–20)
CO2: 24 mmol/L (ref 22–32)
Calcium: 9.2 mg/dL (ref 8.9–10.3)
Chloride: 103 mmol/L (ref 98–111)
Creatinine, Ser: 1.13 mg/dL (ref 0.61–1.24)
GFR calc Af Amer: >60 mL/min (ref >60)
GFR calc non Af Amer: >60 mL/min (ref >60)
Glucose, Bld: 98 mg/dL (ref 70–99)
Potassium: 4.4 mmol/L (ref 3.5–5.1)
Sodium: 137 mmol/L (ref 135–145)

## 2020-02-05 LAB — DIGOXIN LEVEL: Digoxin Level: <0.2 ng/mL — ABNORMAL LOW (ref 0.8–2.0)

## 2020-02-05 MED ORDER — ENTRESTO 97-103 MG PO TABS: 1.0000 | ORAL_TABLET | Freq: Two times a day (BID) | ORAL | 5 refills | Status: DC

## 2020-02-05 NOTE — Progress Notes (Signed)
  Echocardiogram 2D Echocardiogram has been performed.  Tye Savoy 02/05/2020, 10:47 AM

## 2020-02-05 NOTE — Patient Instructions (Signed)
Labs done today. We will contact you only if your labs are abnormal.  INCREASE Entresto 97-103mg (1 tablet) by mouth 2 times daily.  No other medication changes were made. Please continue all other medications as prescribed.  Your physician recommends that you schedule a follow-up appointment in: 10 days for a lab only appointment, 3 weeks for a pharmacy appointment and 3 months for a follow up with Dr. Shirlee Latch.  You have been referred to Electrophysiology Dr. Bruna Potter office. They will contact you to schedule an appointment.   If you have any questions or concerns before your next appointment please send [place] a message through [place] or call our office at [phone].    TO LEAVE A MESSAGE FOR THE NURSE SELECT OPTION 2, PLEASE LEAVE A MESSAGE INCLUDING: . YOUR NAME . DATE OF BIRTH . CALL BACK NUMBER . REASON FOR CALL**this is important as we prioritize the call backs  YOU WILL RECEIVE A CALL BACK THE SAME DAY AS LONG AS YOU CALL BEFORE 4:00 PM   At the [facility], you and your health needs are our priority. As part of our continuing mission to provide you with exceptional heart care, we have created designated Provider Care Teams. These Care Teams include your primary Cardiologist (physician) and Advanced Practice Providers (APPs- Physician Assistants and Nurse Practitioners) who all work together to provide you with the care you need, when you need it.   You may see any of the following providers on your designated Care Team at your next follow up: Marland Kitchen Dr Arvilla Meres . Dr Marca Ancona . Tonye Becket, NP . Robbie Lis, PA . Karle Plumber, PharmD   Please be sure to bring in all your medications bottles to every appointment.

## 2020-02-06 NOTE — Progress Notes (Signed)
PCP: [facility] Primary Cardiologist: Dr Aundra Dubin   HPI: Mr Guarino is a 44 y.o. with history of anxiety, asthma, and depression. Previously a heavy drinker and smoker.  No longer has driver's license due to multiple DWIs.   Admitted 10/08/19 after syncopal episode and dyspnea with exertion. Had small SAH and rib fracture. Had echo that showed severely reduced EF, new onset acute systolic heart failure. Cardiac cath was negative for coronary disease, and showed low filling pressures and adequate cardiac output. Discharged with Life Vest given concern that syncope was arrhythmic. Started low dose HF meds.  Was evaluated at [facility] ED in 3/21 due to dyspnea and rib pain. New rib fracture was noted on the left. SAH had resolved. CXR and CTA negative. He was discharged to home.   He returns today for followup of CHF.  He is not drinking or smoking.  He says that he is wearing the [place] though he does not have it on today.  He walks for exercise, mild dyspnea after walking about 1/2 mile. No orthopnea/PND.  No palpitations.  No further syncope or falls.    ECG (personally reviewed): NSR, anterolateral TWIs.   Labs (6/21): K 4.7, creatinine 1.15  PMH: 1. Anxiety 2. Asthma 3. H/o ETOH abuse.  4. Chronic systolic CHF: Echo (2/77) with EF < 20%, normal RV.  Nonischemic cardiomyopathy.  - RHC/LHC (3/21): No significant CAD; mean RA 3, PA 24/5, mean PCWP 3, CI 2.3.  - Cardiac MRI (3/21): EF 25%, normal RV, no LGE.  - Echo (6/21): EF 30%, diffuse hypokinesis, normal RV, normal IVC.  5. Syncope: Has Lifevest.  6. Traumatic SAH in 2/21.   SH: Does not drive. No longer smokes. Stopped drinking in 2/21, prior heavy use. Lives with son, mother in law, and father in Sports coach. Difficulty paying for medications. Work on cars full time.   FH: Mom and 2 sister have heart disease. He has not idea what kind of heart problems. Mother deceased ? Cancer.   ROS: All systems negative except as listed in HPI,  PMH and Problem List.   Current Outpatient Medications  Medication Sig Dispense Refill  . carvedilol (COREG) 6.25 MG tablet Take 1 tablet (6.25 mg total) by mouth 2 (two) times daily with a meal. 60 tablet 3  . dapagliflozin propanediol (FARXIGA) 10 MG TABS tablet Take 10 mg by mouth daily before breakfast. 30 tablet 11  . digoxin (LANOXIN) 0.125 MG tablet Take 1 tablet (0.125 mg total) by mouth daily. 90 tablet 3  . meclizine (ANTIVERT) 25 MG tablet Take 0.5 tablets (12.5 mg total) by mouth 3 (three) times daily as needed for dizziness. 30 tablet 6  . spironolactone (ALDACTONE) 25 MG tablet Take 1 tablet (25 mg total) by mouth daily. 90 tablet 3  . sacubitril-valsartan (ENTRESTO) 97-103 MG Take 1 tablet by mouth 2 (two) times daily. 60 tablet 5   No current facility-administered medications for this encounter.   Wt Readings from Last 3 Encounters:  02/05/20 92.1 kg (203 lb)  01/14/20 93.4 kg (205 lb 12.8 oz)  12/24/19 94.5 kg (208 lb 6.4 oz)    Vitals:   02/05/20 1039  BP: (!) 138/98  Pulse: 78  SpO2: 98%  Weight: 92.1 kg (203 lb)   General: NAD Neck: No JVD, no thyromegaly or thyroid nodule.  Lungs: Clear to auscultation bilaterally with normal respiratory effort. CV: Nondisplaced PMI.  Heart regular S1/S2, no S3/S4, no murmur.  No peripheral edema.  No carotid bruit.  Normal pedal pulses.  Abdomen: Soft, nontender, no hepatosplenomegaly, no distention.  Skin: Intact without lesions or rashes.  Neurologic: Alert and oriented x 3.  Psych: Normal affect. Extremities: No clubbing or cyanosis.  HEENT: Normal.   Assessment/Plan: 1. Chronic Systolic HF:  Nonischemic cardiomyopathy. Echo in 2/21 showedLV dilation with EF <20%, the RV appeared normal. Given the LV dilation, I suspected that the cardiomyopathy was not new (probably less likely to be a stress cardiomyopathy related to the subarachnoid hemorrhage). 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 He is no longer  drinking. Cannot rule out viral cardiomyopathy. He has multiple family members with "heart disease" but he does not have details. Coronary angiography did not show any significant CAD. Cardiac MRI showed no infiltrative disease, no prior MI, EF 25%, RV normal.  Echo today showed EF 30% with diffuse hypokinesis, mild LV dilation. NYHA class II symptoms, not volume overloaded on exam.  - Increase Entresto to 97/103 bid. BMET today and in 10 days.  - Continue digoxin 0.125 mg daily. Check level today  - Continue spironolactone 25 mg daily.  - Continue Coreg 6.25 mg bid.  - Continue Farxiga 10 mg daily.  - EF remains low, I will refer to EP for ICD (continue Lifevest until then).   2. Syncope: Initial presentation with syncope, no prodrome.  Given low EF, ?arrhythmic event.  He is wearing Lifevest.  With persistently decreased EF, would recommend ICD.   - Refer to EP as above.  3. SAH: Small volume SAH post-syncope in 2/21.  - Repeat CT of head 10/21/19 showed SAH has resolved.  4. ETOH abuse: Reports complete cessation.    Followup with HF pharmacist in 3 wks to increase Coreg.  See me again in 3 months.   Marca Ancona 02/06/2020

## 2020-02-11 ENCOUNTER — Other Ambulatory Visit (HOSPITAL_COMMUNITY): Payer: Self-pay | Admitting: *Deleted

## 2020-02-11 DIAGNOSIS — I38 Endocarditis, valve unspecified: Secondary | ICD-10-CM

## 2020-02-11 DIAGNOSIS — I5022 Chronic systolic (congestive) heart failure: Secondary | ICD-10-CM

## 2020-02-11 MED ORDER — DAPAGLIFLOZIN PROPANEDIOL 10 MG PO TABS: 10.0000 mg | ORAL_TABLET | Freq: Every day | ORAL | 11 refills | Status: DC

## 2020-02-11 MED ORDER — DIGOXIN 125 MCG PO TABS: 0.1250 mg | ORAL_TABLET | Freq: Every day | ORAL | 3 refills | Status: DC

## 2020-02-13 ENCOUNTER — Encounter (HOSPITAL_COMMUNITY): Payer: Self-pay | Admitting: Emergency Medicine

## 2020-02-13 ENCOUNTER — Emergency Department (HOSPITAL_COMMUNITY): Payer: Medicaid Other

## 2020-02-13 ENCOUNTER — Emergency Department (HOSPITAL_COMMUNITY)
Admission: EM | Admit: 2020-02-13 | Discharge: 2020-02-13 | Disposition: A | Discharge status: Home / Self Care | Payer: Medicaid Other | Attending: Emergency Medicine | Admitting: Emergency Medicine

## 2020-02-13 ENCOUNTER — Other Ambulatory Visit: Payer: Self-pay

## 2020-02-13 DIAGNOSIS — J45909 Unspecified asthma, uncomplicated: Secondary | ICD-10-CM | POA: Insufficient documentation

## 2020-02-13 DIAGNOSIS — M5412 Radiculopathy, cervical region: Secondary | ICD-10-CM

## 2020-02-13 DIAGNOSIS — Z87891 Personal history of nicotine dependence: Secondary | ICD-10-CM | POA: Insufficient documentation

## 2020-02-13 DIAGNOSIS — I5021 Acute systolic (congestive) heart failure: Secondary | ICD-10-CM | POA: Diagnosis not present

## 2020-02-13 DIAGNOSIS — Z79899 Other long term (current) drug therapy: Secondary | ICD-10-CM | POA: Insufficient documentation

## 2020-02-13 DIAGNOSIS — R52 Pain, unspecified: Secondary | ICD-10-CM

## 2020-02-13 DIAGNOSIS — M25512 Pain in left shoulder: Secondary | ICD-10-CM | POA: Diagnosis present

## 2020-02-13 MED ORDER — METHOCARBAMOL 500 MG PO TABS
500.0000 mg | ORAL_TABLET | Freq: Three times a day (TID) | ORAL | 0 refills | Status: DC
Start: 2020-02-13 — End: 2020-03-10

## 2020-02-13 MED ORDER — PREDNISONE 10 MG PO TABS
ORAL_TABLET | ORAL | 0 refills | Status: DC
Start: 2020-02-13 — End: 2020-03-10

## 2020-02-13 MED ORDER — HYDROCODONE-ACETAMINOPHEN 5-325 MG PO TABS: ORAL_TABLET | ORAL | 0 refills | Status: DC

## 2020-02-13 NOTE — ED Provider Notes (Signed)
[facility] EMERGENCY DEPARTMENT Provider Note   CSN: [id number] Arrival date & time: 02/13/20  2025     History Chief Complaint  Patient presents with  . Shoulder Pain    Andrew Hinton is a 44 y.o. male.  HPI      Andrew Hinton is a 44 y.o. male with past medical history of CHF, and traumatic subarachnoid hemorrhage in February of this year that was related to a fall down a flight of stairs.  Who presents to the Emergency Department complaining of left shoulder pain for 1 week.  He states the symptoms have gradually became worse.  He denies known injury.  He describes a sharp pain that radiates from his left neck down through his shoulder and into his arm and hand.  He reports waking each morning with a numbness and tingling sensation to his hand that gradually improves throughout the day.  He complains of pain with movement of the left shoulder.  He has taken over-the-counter pain relievers without improvement.  He denies headache, dizziness, swelling or recent neck or shoulder injuries.   Past Medical History:  Diagnosis Date  . Anxiety   . Asthma   . Depression   . Nerve damage     Patient Active Problem List   Diagnosis Date Noted  . Syncope 10/09/2019  . AKI (acute kidney injury) (HCC) 10/09/2019  . Acute systolic CHF (congestive heart failure) (HCC) 10/09/2019  . Subarachnoid hemorrhage (HCC) 10/08/2019    Past Surgical History:  Procedure Laterality Date  . APPENDECTOMY    . RIGHT/LEFT HEART CATH AND CORONARY ANGIOGRAPHY N/A 10/12/2019   Procedure: RIGHT/LEFT HEART CATH AND CORONARY ANGIOGRAPHY;  Surgeon: Laurey Morale, MD;  Location: [facility] INVASIVE CV LAB;  Service: Cardiovascular;  Laterality: N/A;       No family history on file.  Social History   Tobacco Use  . Smoking status: Former Smoker    Packs/day: 0.50    Types: Cigarettes    Quit date: 10/07/2017    Years since quitting: 2.3  . Smokeless tobacco: Never Used  Vaping Use  . Vaping  Use: Never used  Substance Use Topics  . Alcohol use: Yes    Comment: occasionally   . Drug use: No    Home Medications Prior to Admission medications   Medication Sig Start Date End Date Taking? Authorizing Provider  carvedilol (COREG) 6.25 MG tablet Take 1 tablet (6.25 mg total) by mouth 2 (two) times daily with a meal. 01/14/20  Yes Clegg, Amy D, NP  dapagliflozin propanediol (FARXIGA) 10 MG TABS tablet Take 1 tablet (10 mg total) by mouth daily before breakfast. 02/11/20  Yes Laurey Morale, MD  digoxin (LANOXIN) 0.125 MG tablet Take 1 tablet (0.125 mg total) by mouth daily. 02/11/20  Yes Laurey Morale, MD  meclizine (ANTIVERT) 25 MG tablet Take 0.5 tablets (12.5 mg total) by mouth 3 (three) times daily as needed for dizziness. 11/10/19  Yes Simmons, Brittainy M, PA-C  sacubitril-valsartan (ENTRESTO) 97-103 MG Take 1 tablet by mouth 2 (two) times daily. 02/05/20  Yes Laurey Morale, MD  spironolactone (ALDACTONE) 25 MG tablet Take 1 tablet (25 mg total) by mouth daily. 12/24/19  Yes Laurey Morale, MD  HYDROcodone-acetaminophen (NORCO/VICODIN) 5-325 MG tablet Take one tab po q 4 hrs prn pain 02/13/20   Pedro Whiters, PA-C  methocarbamol (ROBAXIN) 500 MG tablet Take 1 tablet (500 mg total) by mouth 3 (three) times daily. 02/13/20   Pauline Aus, PA-C  predniSONE (DELTASONE) 10 MG tablet Take 6 tablets day one, 5 tablets day two, 4 tablets day three, 3 tablets day four, 2 tablets day five, then 1 tablet day six 02/13/20   Jerone Cudmore, PA-C    Allergies    Patient has no known allergies.  Review of Systems   Review of Systems  Constitutional: Negative for chills and fever.  Eyes: Negative for visual disturbance.  Respiratory: Negative for shortness of breath.   Cardiovascular: Negative for chest pain.  Gastrointestinal: Negative for abdominal pain, nausea and vomiting.  Genitourinary: Negative for dysuria.  Musculoskeletal: Positive for arthralgias (left shoulder pain) and neck  pain. Negative for back pain and joint swelling.  Skin: Negative for color change, rash and wound.  Neurological: Negative for dizziness, syncope, speech difficulty, weakness, numbness and headaches.  Psychiatric/Behavioral: Negative for confusion.    Physical Exam Updated Vital Signs BP 136/90 (BP Location: Right Arm)   Pulse 79   Temp 97.7 F (36.5 C) (Oral)   Resp 16   Ht 6' (1.829 m)   Wt 92.1 kg   SpO2 100%   BMI 27.53 kg/m   Physical Exam Vitals and nursing note reviewed.  Constitutional:      General: He is not in acute distress.    Appearance: Normal appearance. He is not ill-appearing.  HENT:     Head: Atraumatic.  Neck:     Trachea: Phonation normal.     Comments: Tenderness to palpation of the left cervical paraspinal muscles.  Tenderness extends into the left rhomboid muscles as well. Cardiovascular:     Rate and Rhythm: Normal rate and regular rhythm.     Pulses: Normal pulses.  Pulmonary:     Effort: Pulmonary effort is normal.     Breath sounds: Normal breath sounds.  Chest:     Chest wall: No tenderness.  Musculoskeletal:        General: No swelling or signs of injury.     Cervical back: Normal range of motion. Tenderness present. No signs of trauma, rigidity or torticollis. Muscular tenderness present. No spinous process tenderness.     Comments: Pain with abduction of the left arm.  No bony deformities of the left shoulder.  Left AC joint is nontender.  Skin:    General: Skin is warm.     Capillary Refill: Capillary refill takes less than 2 seconds.     Findings: No erythema or rash.  Neurological:     General: No focal deficit present.     Mental Status: He is alert.     Sensory: No sensory deficit.     Motor: No weakness.     ED Results / Procedures / Treatments   Labs (all labs ordered are listed, but only abnormal results are displayed) Labs Reviewed - No data to display  EKG None  Radiology DG Cervical Spine Complete  Result Date:  02/13/2020 CLINICAL DATA:  Pain EXAM: CERVICAL SPINE - COMPLETE 4+ VIEW COMPARISON:  CT cervical spine dated 10/08/2019 FINDINGS: There is no evidence of cervical spine fracture or prevertebral soft tissue swelling. Alignment is normal. No other significant bone abnormalities are identified. IMPRESSION: Negative cervical spine radiographs. Electronically Signed   By: Romona Curls M.D.   On: 02/13/2020 11:26   DG Shoulder Left  Result Date: 02/13/2020 CLINICAL DATA:  Left shoulder pain for 1 week EXAM: LEFT SHOULDER - 2+ VIEW COMPARISON:  Shoulder radiographs dated 10/08/2019 FINDINGS: There is no evidence of fracture or dislocation. There is no  evidence of arthropathy or other focal bone abnormality. Soft tissues are unremarkable. IMPRESSION: Negative. Electronically Signed   By: Romona Curls M.D.   On: 02/13/2020 10:16    Procedures Procedures (including critical care time)  Medications Ordered in ED Medications - No data to display  ED Course  I have reviewed the triage vital signs and the nursing notes.  Pertinent labs & imaging results that were available during my care of the patient were reviewed by me and considered in my medical decision making (see chart for details).    MDM Rules/Calculators/A&P                          Pt here with left shoulder pain, no known injury.  NV intact.  No focal neuro deficits.  Pain seems to be radicular.  XR of shoulder and C spine are reassuring.   I feel that pt is appropriate for d/c home, he agrees to f/u with orthopedics.     Final Clinical Impression(s) / ED Diagnoses Final diagnoses:  Cervical radiculopathy    Rx / DC Orders ED Discharge Orders         Ordered    predniSONE (DELTASONE) 10 MG tablet     Discontinue  Reprint     02/13/20 1232    methocarbamol (ROBAXIN) 500 MG tablet  3 times daily     Discontinue  Reprint     02/13/20 1232    HYDROcodone-acetaminophen (NORCO/VICODIN) 5-325 MG tablet     Discontinue  Reprint      02/13/20 1232           Pauline Aus, PA-C 02/13/20 Ileene Hutchinson, MD 02/14/20 [phone]

## 2020-02-13 NOTE — Discharge Instructions (Addendum)
Try alternating ice and heat to your neck and left shoulder area.  You may call the orthopedic provider listed to arrange follow-up appointment in 1 week if not improving.

## 2020-02-13 NOTE — ED Triage Notes (Signed)
LT shoulder pain with movement x 1 week. Denies any injury

## 2020-02-16 ENCOUNTER — Ambulatory Visit (HOSPITAL_COMMUNITY)
Admission: RE | Admit: 2020-02-16 | Discharge: 2020-02-16 | Disposition: A | Discharge status: Home / Self Care | Payer: Medicaid Other | Source: Ambulatory Visit | Attending: Cardiology | Admitting: Cardiology

## 2020-02-16 ENCOUNTER — Other Ambulatory Visit: Payer: Self-pay

## 2020-02-16 DIAGNOSIS — I5022 Chronic systolic (congestive) heart failure: Secondary | ICD-10-CM | POA: Diagnosis present

## 2020-02-16 LAB — BASIC METABOLIC PANEL
Anion gap: 10 (ref 5–15)
BUN: 27 mg/dL — ABNORMAL HIGH (ref 6–20)
CO2: 21 mmol/L — ABNORMAL LOW (ref 22–32)
Calcium: 9.1 mg/dL (ref 8.9–10.3)
Chloride: 107 mmol/L (ref 98–111)
Creatinine, Ser: 1.13 mg/dL (ref 0.61–1.24)
GFR calc Af Amer: >60 mL/min (ref >60)
GFR calc non Af Amer: >60 mL/min (ref >60)
Glucose, Bld: 97 mg/dL (ref 70–99)
Potassium: 4 mmol/L (ref 3.5–5.1)
Sodium: 138 mmol/L (ref 135–145)

## 2020-02-19 ENCOUNTER — Ambulatory Visit: Payer: Medicaid Other | Admitting: Orthopaedic Surgery

## 2020-02-26 NOTE — Progress Notes (Signed)
PCP: [facility] Primary Cardiologist: Dr Shirlee Latch   HPI:  [name] is a 44 year old with history of anxiety, asthma, and depression.Previously a heavy drinker and smoker until 2 years ago. Former heavy drinker. No longer has drivers license due to multiple DWIs.   Admitted 10/08/19 after syncopal episode and dyspnea with exertion. Had SAH and rib fracture . Had ECHO that showed severely reduced EF. Had new onset acute systolic heart failure. Cath was negative for coronary disease, low filling pressures, and adequate cardiac output. Discharged with Life Vest. Started low dose HF meds. Discharged to home on 10/14/19  [facility] to dyspnea and rib pain. New rib fracture was noted on the left. SAH had resolved. CXR and CTA negative. He was discharged to home.   [facility] for post hospital f/u on 10/22/19.Overall feeling fair.Was having some dizziness that improvedwith meclizine. Stable from a CHF standpoint however his HF meds were not titrated due to ongoing dizziness.   Recently presented to HF Clinic for follow up with Dr. Shirlee Latch on 02/05/20. He was not drinking or smoking.  He stated that he was wearing the Lifevest though he did not have it on that day.  He reported walking for exercise. Had mild dyspnea after walking about 1/2 mile. No orthopnea/PND.  No palpitations.  No further syncope or falls.    Today he returns to HF clinic for pharmacist medication titration. At last visit with MD, Sherryll Burger was increased to 97/103 mg BID. Overall he is feeling well today. Main complaint is breast tenderness/pain from spironolactone. No dizziness, lightheadedness, chest pain or palpitations. Notes he can walk "a couple of blocks" before becoming SOB. Still SOB when going uphill. His weight at home has been stable at 202-203 lbs. No LEE, PND or orthopnea. He is only taking carvedilol 3.125 mg BID instead of the 6.25 mg BID dose because he forgot it had been changed  and the pharmacy gave him the old strength when it was last filled 02/17/20.      HF Medications: Carvedilol 6.25 mg BID Entresto 97/103 mg BID Spironolactone 25 mg daily Dapagliflozin 10 mg daily Digoxin 0.125 mg daily  Has the patient been experiencing any side effects to the medications prescribed?  yes - breast tenderness with spironolactone  Does the patient have any problems obtaining medications due to transportation or finances? No - Patient was recently approved for [place] [employer].  Understanding of regimen: good Understanding of indications: good Potential of compliance: good Patient understands to avoid NSAIDs. Patient understands to avoid decongestants.  Pertinent Lab Values (02/16/20):  Serum creatinine 1.13, BUN 27, Potassium 4.0, Sodium 138, Digoxin <0.2 ng/mL (11/10/19)   Vital Signs:  Weight: 204 lbs (last clinic weight: 203 lbs)  Blood pressure: 128/74  Heart rate: 82  Assessment: 1. Chronic Systolic HF:  Nonischemic cardiomyopathy. Echo in 2/21 showedLV dilation with EF <20%, the RV appeared normal. Given the LV dilation, I suspected that the cardiomyopathy was not new (probably less likely to be a stress cardiomyopathy related to the subarachnoid hemorrhage). 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 He is no longer drinking. Cannot rule out viral cardiomyopathy. He has multiple family members with "heart disease" but he does not have details. Coronary angiography did not show any significant CAD. Cardiac MRI showed no infiltrative disease, no prior MI, EF 25%, RV normal. Echo today showed EF 30% with diffuse hypokinesis, mild LV dilation.  -NYHA class II symptoms, not volume overloaded on  exam.   - Increase carvedilol to 6.25 mg BID.Never increased as instructed because his pharmacy dispensed the wrong dose (carvedilol 3.125 mg BID). - Continue Entresto 97/103 mg BID. - Stop spironolactone due to complaints of gynecomastia/breast  tenderness. Start eplerenone 25 mg daily. Repeat BMET in 2 weeks.  - Continue dapagliflozin 10 mg daily.  - Continue digoxin 0.125 mg daily. Digoxin level <0.2 ng/mL on 02/05/20.   - EF remains low, plan to refer to EP for ICD (continue Lifevest until then).   2. Syncope: Initial presentation with syncope, no prodrome.  Given low EF, ?arrhythmic event.  He is wearing Lifevest.  With persistently decreased EF, would recommend ICD.   - Refer to EP as above.  3. SAH: Small volume SAH post-syncope in 2/21.  - Repeat CT of head 10/21/19 showed SAH has resolved.  4. ETOH abuse: Reports complete cessation.    Plan: 1) Medication changes: Based on clinical presentation, vital signs and recent labs will increase carvedilol to 6.25 mg BID. Will also stop spironolactone due to breast tenderness and start eplerenone 25 mg daily.  3) Follow-up: 2 months with Dr. Jonn Shingles, PharmD, BCPS, [facility], CPP Heart Failure Clinic Pharmacist [phone]

## 2020-03-10 ENCOUNTER — Other Ambulatory Visit: Payer: Self-pay

## 2020-03-10 ENCOUNTER — Ambulatory Visit (HOSPITAL_COMMUNITY)
Admission: RE | Admit: 2020-03-10 | Discharge: 2020-03-10 | Disposition: A | Discharge status: Home / Self Care | Payer: Medicaid Other | Source: Ambulatory Visit | Attending: Internal Medicine | Admitting: Internal Medicine

## 2020-03-10 VITALS — BP 128/74 | HR 82 | Wt 204.0 lb

## 2020-03-10 DIAGNOSIS — F1011 Alcohol abuse, in remission: Secondary | ICD-10-CM | POA: Insufficient documentation

## 2020-03-10 DIAGNOSIS — Z87891 Personal history of nicotine dependence: Secondary | ICD-10-CM | POA: Diagnosis not present

## 2020-03-10 DIAGNOSIS — F419 Anxiety disorder, unspecified: Secondary | ICD-10-CM | POA: Insufficient documentation

## 2020-03-10 DIAGNOSIS — N62 Hypertrophy of breast: Secondary | ICD-10-CM | POA: Diagnosis not present

## 2020-03-10 DIAGNOSIS — Z79899 Other long term (current) drug therapy: Secondary | ICD-10-CM | POA: Diagnosis not present

## 2020-03-10 DIAGNOSIS — Z9861 Coronary angioplasty status: Secondary | ICD-10-CM | POA: Insufficient documentation

## 2020-03-10 DIAGNOSIS — I609 Nontraumatic subarachnoid hemorrhage, unspecified: Secondary | ICD-10-CM | POA: Insufficient documentation

## 2020-03-10 DIAGNOSIS — I5022 Chronic systolic (congestive) heart failure: Secondary | ICD-10-CM | POA: Diagnosis present

## 2020-03-10 DIAGNOSIS — J45909 Unspecified asthma, uncomplicated: Secondary | ICD-10-CM | POA: Diagnosis not present

## 2020-03-10 DIAGNOSIS — R55 Syncope and collapse: Secondary | ICD-10-CM | POA: Insufficient documentation

## 2020-03-10 DIAGNOSIS — I428 Other cardiomyopathies: Secondary | ICD-10-CM | POA: Insufficient documentation

## 2020-03-10 DIAGNOSIS — F329 Major depressive disorder, single episode, unspecified: Secondary | ICD-10-CM | POA: Diagnosis not present

## 2020-03-10 MED ORDER — CARVEDILOL 6.25 MG PO TABS: 6.2500 mg | ORAL_TABLET | Freq: Two times a day (BID) | ORAL | 11 refills | Status: DC

## 2020-03-10 MED ORDER — EPLERENONE 25 MG PO TABS: 25.0000 mg | ORAL_TABLET | Freq: Every day | ORAL | 11 refills | Status: DC

## 2020-03-10 NOTE — Patient Instructions (Addendum)
It was a pleasure seeing you today!  MEDICATIONS: -We are changing your medications today -Increase carvedilol to 6.25 mg (1 tablet) twice daily. You may take 2 tablets of the 3.125 mg strength until you pick up the new strength.  -Stop taking spironolactone and start taking eplerenone 25 mg daily.  -Call if you have questions about your medications.  NEXT APPOINTMENT: Lab appointment in 2 weeks Return to clinic in 2 months with Dr. Shirlee Latch.  In general, to take care of your heart failure: -Limit your fluid intake to 2 Liters (half-gallon) per day.   -Limit your salt intake to ideally 2-3 grams (2000-3000 mg) per day. -Weigh yourself daily and record, and bring that "weight diary" to your next appointment.  (Weight gain of 2-3 pounds in 1 day typically means fluid weight.) -The medications for your heart are to help your heart and help you live longer.   -Please contact us before stopping any of your heart medications.  Call the clinic at [phone] with questions or to reschedule future appointments.

## 2020-03-16 ENCOUNTER — Other Ambulatory Visit: Payer: Self-pay

## 2020-03-16 ENCOUNTER — Ambulatory Visit (INDEPENDENT_AMBULATORY_CARE_PROVIDER_SITE_OTHER): Payer: Medicaid Other | Admitting: Internal Medicine

## 2020-03-16 DIAGNOSIS — I5022 Chronic systolic (congestive) heart failure: Secondary | ICD-10-CM | POA: Diagnosis not present

## 2020-03-16 LAB — BASIC METABOLIC PANEL
BUN/Creatinine Ratio: 17 (ref 9–20)
BUN: 20 mg/dL (ref 6–24)
CO2: 26 mmol/L (ref 20–29)
Calcium: 10.4 mg/dL — ABNORMAL HIGH (ref 8.7–10.2)
Chloride: 100 mmol/L (ref 96–106)
Creatinine, Ser: 1.15 mg/dL (ref 0.76–1.27)
GFR calc Af Amer: 90 mL/min/{1.73_m2} (ref >59)
GFR calc non Af Amer: 78 mL/min/{1.73_m2} (ref >59)
Glucose: 110 mg/dL — ABNORMAL HIGH (ref 65–99)
Potassium: 4.9 mmol/L (ref 3.5–5.2)
Sodium: 137 mmol/L (ref 134–144)

## 2020-03-16 LAB — CBC WITH DIFFERENTIAL/PLATELET
Basophils Absolute: 0.1 10*3/uL (ref 0.0–0.2)
Basos: 1 %
EOS (ABSOLUTE): 0.6 10*3/uL — ABNORMAL HIGH (ref 0.0–0.4)
Eos: 7 %
Hematocrit: 45.2 % (ref 37.5–51.0)
Hemoglobin: 15.6 g/dL (ref 13.0–17.7)
Lymphocytes Absolute: 2.6 10*3/uL (ref 0.7–3.1)
Lymphs: 32 %
MCH: 32.4 pg (ref 26.6–33.0)
MCHC: 34.5 g/dL (ref 31.5–35.7)
MCV: 94 fL (ref 79–97)
Monocytes Absolute: 0.8 10*3/uL (ref 0.1–0.9)
Monocytes: 10 %
Neutrophils Absolute: 4.1 10*3/uL (ref 1.4–7.0)
Neutrophils: 50 %
Platelets: 458 10*3/uL — ABNORMAL HIGH (ref 150–450)
RBC: 4.82 x10E6/uL (ref 4.14–5.80)
RDW: 12.9 % (ref 11.6–15.4)
WBC: 8.2 10*3/uL (ref 3.4–10.8)

## 2020-03-16 NOTE — Patient Instructions (Addendum)
Medication Instructions:  Your physician recommends that you continue on your current medications as directed. Please refer to the Current Medication list given to you today.  Labwork: You will get lab work today:  BMP and CBC  Testing/Procedures: Your physician has recommended that you have a defibrillator inserted. An implantable cardioverter defibrillator (ICD) is a small device that is placed in your chest or, in rare cases, your abdomen. This device uses electrical pulses or shocks to help control life-threatening, irregular heartbeats that could lead the heart to suddenly stop beating (sudden cardiac arrest). Leads are attached to the ICD that goes into your heart. This is done in the hospital and usually requires an overnight stay. Please see the instruction sheet given to you today for more information.  Follow-Up:  SEE INSTRUCTION LETTER  Any Other Special Instructions Will Be Listed Below (If Applicable).  If you need a refill on your cardiac medications before your next appointment, please call your pharmacy.    Cardioverter Defibrillator Implantation  An implantable cardioverter defibrillator (ICD) is a small device that is placed under the skin in the chest or abdomen. An ICD consists of a battery, a small computer (pulse generator), and wires (leads) that go into the heart. An ICD is used to detect and correct two types of dangerous irregular heartbeats (arrhythmias):  A rapid heart rhythm (tachycardia).  An arrhythmia in which the lower chambers of the heart (ventricles) contract in an uncoordinated way (fibrillation). When an ICD detects tachycardia, it sends a low-energy shock to the heart to restore the heartbeat to normal (cardioversion). This signal is usually painless. If cardioversion does not work or if the ICD detects fibrillation, it delivers a high-energy shock to the heart (defibrillation) to restart the heart. This shock may feel like a strong jolt in the  chest. Your health care provider may prescribe an ICD if:  You have had an arrhythmia that originated in the ventricles.  Your heart has been damaged by a disease or heart condition. Sometimes, ICDs are programmed to act as a device called a pacemaker. Pacemakers can be used to treat a slow heartbeat (bradycardia) or tachycardia by taking over the heart rate with electrical impulses. Tell a health care provider about:  Any allergies you have.  All medicines you are taking, including vitamins, herbs, eye drops, creams, and over-the-counter medicines.  Any problems you or family members have had with anesthetic medicines.  Any blood disorders you have.  Any surgeries you have had.  Any medical conditions you have.  Whether you are pregnant or may be pregnant. What are the risks? Generally, this is a safe procedure. However, problems may occur, including:  Swelling, bleeding, or bruising.  Infection.  Blood clots.  Damage to other structures or organs, such as nerves, blood vessels, or the heart.  Allergic reactions to medicines used during the procedure. What happens before the procedure? Staying hydrated Follow instructions from your health care provider about hydration, which may include:  Up to 2 hours before the procedure - you may continue to drink clear liquids, such as water, clear fruit juice, black coffee, and plain tea. Eating and drinking restrictions Follow instructions from your health care provider about eating and drinking, which may include:  8 hours before the procedure - stop eating heavy meals or foods such as meat, fried foods, or fatty foods.  6 hours before the procedure - stop eating light meals or foods, such as toast or cereal.  6 hours before the procedure -   stop drinking milk or drinks that contain milk.  2 hours before the procedure - stop drinking clear liquids. Medicine Ask your health care provider about:  Changing or stopping your  normal medicines. This is important if you take diabetes medicines or blood thinners.  Taking medicines such as aspirin and ibuprofen. These medicines can thin your blood. Do not take these medicines before your procedure if your doctor tells you not to. Tests  You may have blood tests.  You may have a test to check the electrical signals in your heart (electrocardiogram, ECG).  You may have imaging tests, such as a chest X-ray. General instructions  For 24 hours before the procedure, stop using products that contain nicotine or tobacco, such as cigarettes and e-cigarettes. If you need help quitting, ask your health care provider.  Plan to have someone take you home from the hospital or clinic.  You may be asked to shower with a germ-killing soap. What happens during the procedure?  To reduce your risk of infection: ? Your health care team will wash or sanitize their hands. ? Your skin will be washed with soap. ? Hair may be removed from the surgical area.  Small monitors will be put on your body. They will be used to check your heart, blood pressure, and oxygen level.  An IV tube will be inserted into one of your veins.  You will be given one or more of the following: ? A medicine to help you relax (sedative). ? A medicine to numb the area (local anesthetic). ? A medicine to make you fall asleep (general anesthetic).  Leads will be guided through a blood vessel into your heart and attached to your heart muscles. Depending on the ICD, the leads may go into one ventricle or they may go into both ventricles and into an upper chamber of the heart. An X-ray machine (fluoroscope) will be usedto help guide the leads.  A small incision will be made to create a deep pocket under your skin.  The pulse generator will be placed into the pocket.  The ICD will be tested.  The incision will be closed with stitches (sutures), skin glue, or staples.  A bandage (dressing) will be placed  over the incision. This procedure may vary among health care providers and hospitals. What happens after the procedure?  Your blood pressure, heart rate, breathing rate, and blood oxygen level will be monitored often until the medicines you were given have worn off.  A chest X-ray will be taken to check that the ICD is in the right place.  You will need to stay in the hospital for 1-2 days so your health care provider can make sure your ICD is working.  Do not drive for 24 hours if you received a sedative. Ask your health care provider when it is safe for you to drive.  You may be given an identification card explaining that you have an ICD. Summary  An implantable cardioverter defibrillator (ICD) is a small device that is placed under the skin in the chest or abdomen. It is used to detect and correct dangerous irregular heartbeats (arrhythmias).  An ICD consists of a battery, a small computer (pulse generator), and wires (leads) that go into the heart.  When an ICD detects rapid heart rhythm (tachycardia), it sends a low-energy shock to the heart to restore the heartbeat to normal (cardioversion). If cardioversion does not work or if the ICD detects uncoordinated heart contractions (fibrillation), it delivers a   high-energy shock to the heart (defibrillation) to restart the heart.  You will need to stay in the hospital for 1-2 days to make sure your ICD is working. This information is not intended to replace advice given to you by your health care provider. Make sure you discuss any questions you have with your health care provider. Document Revised: 07/12/2017 Document Reviewed: 08/08/2016 Elsevier Patient Education  2020 [employer]   

## 2020-03-16 NOTE — Progress Notes (Signed)
HPI Andrew Hinton is referred today by Dr. Shirlee Latch to consider ICD insertion. He is a pleasant 44 yo man with a non-ischemic CM, chronic systolic heart failure, EF 30% by echo. He has been on maximal medical therapy. He has class 2 symptoms. He wears a life vest but his compliance is in question. He is not wearing it today. He works part time as a Curator. He has a remote h/o ETOH abuse. Allergies  Allergen Reactions  . Spironolactone     Gynecomastia/breast tenderness     Current Outpatient Medications  Medication Sig Dispense Refill  . carvedilol (COREG) 6.25 MG tablet Take 1 tablet (6.25 mg total) by mouth 2 (two) times daily with a meal. 60 tablet 11  . dapagliflozin propanediol (FARXIGA) 10 MG TABS tablet Take 1 tablet (10 mg total) by mouth daily before breakfast. 30 tablet 11  . digoxin (LANOXIN) 0.125 MG tablet Take 1 tablet (0.125 mg total) by mouth daily. 90 tablet 3  . eplerenone (INSPRA) 25 MG tablet Take 1 tablet (25 mg total) by mouth daily. 30 tablet 11  . meclizine (ANTIVERT) 25 MG tablet Take 0.5 tablets (12.5 mg total) by mouth 3 (three) times daily as needed for dizziness. 30 tablet 6  . sacubitril-valsartan (ENTRESTO) 97-103 MG Take 1 tablet by mouth 2 (two) times daily. 60 tablet 5   No current facility-administered medications for this visit.     Past Medical History:  Diagnosis Date  . Anxiety   . Asthma   . Depression   . Nerve damage     ROS:   All systems reviewed and negative except as noted in the HPI.   Past Surgical History:  Procedure Laterality Date  . APPENDECTOMY    . RIGHT/LEFT HEART CATH AND CORONARY ANGIOGRAPHY N/A 10/12/2019   Procedure: RIGHT/LEFT HEART CATH AND CORONARY ANGIOGRAPHY;  Surgeon: Laurey Morale, MD;  Location: [facility] INVASIVE CV LAB;  Service: Cardiovascular;  Laterality: N/A;     No family history on file.   Social History   Socioeconomic History  . Marital status: Divorced    Spouse name: Not on file  .  Number of children: 2  . Years of education: Not on file  . Highest education level: Not on file  Occupational History  . Not on file  Tobacco Use  . Smoking status: Former Smoker    Packs/day: 0.50    Types: Cigarettes    Quit date: 10/07/2017    Years since quitting: 2.4  . Smokeless tobacco: Never Used  Vaping Use  . Vaping Use: Never used  Substance and Sexual Activity  . Alcohol use: Yes    Comment: occasionally   . Drug use: No  . Sexual activity: Not on file  Other Topics Concern  . Not on file  Social History Narrative  . Not on file   Social Determinants of Health   Financial Resource Strain: Medium Risk  . Difficulty of Paying Living Expenses: Somewhat hard  Food Insecurity: No Food Insecurity  . Worried About Programme researcher, broadcasting/film/video in the Last Year: Never true  . Ran Out of Food in the Last Year: Never true  Transportation Needs: No Transportation Needs  . Lack of Transportation (Medical): No  . Lack of Transportation (Non-Medical): No  Physical Activity:   . Days of Exercise per Week:   . Minutes of Exercise per Session:   Stress: Stress Concern Present  . Feeling of Stress : Rather much  Social Connections:   . Frequency of Communication with Friends and Family:   . Frequency of Social Gatherings with Friends and Family:   . Attends Religious Services:   . Active Member of Clubs or Organizations:   . Attends Banker Meetings:   Marland Kitchen Marital Status:   Intimate Partner Violence:   . Fear of Current or Ex-Partner:   . Emotionally Abused:   Marland Kitchen Physically Abused:   . Sexually Abused:      BP 118/82   Pulse 89   Ht 6' (1.829 m)   Wt 202 lb (91.6 kg)   SpO2 97%   BMI 27.40 kg/m   Physical Exam:  Well appearing NAD HEENT: Unremarkable Neck:  No JVD, no thyromegally Lymphatics:  No adenopathy Back:  No CVA tenderness Lungs:  Clear with no wheezes HEART:  Regular rate rhythm, no murmurs, no rubs, no clicks Abd:  soft, positive bowel  sounds, no organomegally, no rebound, no guarding Ext:  2 plus pulses, no edema, no cyanosis, no clubbing Skin:  No rashes no nodules Neuro:  CN II through XII intact, motor grossly intact  EKG - nsr  Assess/Plan: 1. Chronic systolic heart failure - his symptoms are class 2. He will continue his current meds. I have discussed the indications/risks/benefits/goals/expectations of ICD insertion for primary prevention and he wishes to proceed. 2. Syncope - this occurred prior to his initial presentation and has not recurred.   Leonia Reeves.D.

## 2020-03-16 NOTE — H&P (View-Only) (Signed)
    HPI [name] is referred today by Dr. McLean to consider ICD insertion. He is a pleasant 44 yo man with a non-ischemic CM, chronic systolic heart failure, EF 30% by echo. He has been on maximal medical therapy. He has class 2 symptoms. He wears a life vest but his compliance is in question. He is not wearing it today. He works part time as a mechanic. He has a remote h/o ETOH abuse. Allergies  Allergen Reactions  . Spironolactone     Gynecomastia/breast tenderness     Current Outpatient Medications  Medication Sig Dispense Refill  . carvedilol (COREG) 6.25 MG tablet Take 1 tablet (6.25 mg total) by mouth 2 (two) times daily with a meal. 60 tablet 11  . dapagliflozin propanediol (FARXIGA) 10 MG TABS tablet Take 1 tablet (10 mg total) by mouth daily before breakfast. 30 tablet 11  . digoxin (LANOXIN) 0.125 MG tablet Take 1 tablet (0.125 mg total) by mouth daily. 90 tablet 3  . eplerenone (INSPRA) 25 MG tablet Take 1 tablet (25 mg total) by mouth daily. 30 tablet 11  . meclizine (ANTIVERT) 25 MG tablet Take 0.5 tablets (12.5 mg total) by mouth 3 (three) times daily as needed for dizziness. 30 tablet 6  . sacubitril-valsartan (ENTRESTO) 97-103 MG Take 1 tablet by mouth 2 (two) times daily. 60 tablet 5   No current facility-administered medications for this visit.     Past Medical History:  Diagnosis Date  . Anxiety   . Asthma   . Depression   . Nerve damage     ROS:   All systems reviewed and negative except as noted in the HPI.   Past Surgical History:  Procedure Laterality Date  . APPENDECTOMY    . RIGHT/LEFT HEART CATH AND CORONARY ANGIOGRAPHY N/A 10/12/2019   Procedure: RIGHT/LEFT HEART CATH AND CORONARY ANGIOGRAPHY;  Surgeon: McLean, Dalton S, MD;  Location: [facility] INVASIVE CV LAB;  Service: Cardiovascular;  Laterality: N/A;     No family history on file.   Social History   Socioeconomic History  . Marital status: Divorced    Spouse name: Not on file  .  Number of children: 2  . Years of education: Not on file  . Highest education level: Not on file  Occupational History  . Not on file  Tobacco Use  . Smoking status: Former Smoker    Packs/day: 0.50    Types: Cigarettes    Quit date: 10/07/2017    Years since quitting: 2.4  . Smokeless tobacco: Never Used  Vaping Use  . Vaping Use: Never used  Substance and Sexual Activity  . Alcohol use: Yes    Comment: occasionally   . Drug use: No  . Sexual activity: Not on file  Other Topics Concern  . Not on file  Social History Narrative  . Not on file   Social Determinants of Health   Financial Resource Strain: Medium Risk  . Difficulty of Paying Living Expenses: Somewhat hard  Food Insecurity: No Food Insecurity  . Worried About Running Out of Food in the Last Year: Never true  . Ran Out of Food in the Last Year: Never true  Transportation Needs: No Transportation Needs  . Lack of Transportation (Medical): No  . Lack of Transportation (Non-Medical): No  Physical Activity:   . Days of Exercise per Week:   . Minutes of Exercise per Session:   Stress: Stress Concern Present  . Feeling of Stress : Rather much    Social Connections:   . Frequency of Communication with Friends and Family:   . Frequency of Social Gatherings with Friends and Family:   . Attends Religious Services:   . Active Member of Clubs or Organizations:   . Attends Banker Meetings:   Marland Kitchen Marital Status:   Intimate Partner Violence:   . Fear of Current or Ex-Partner:   . Emotionally Abused:   Marland Kitchen Physically Abused:   . Sexually Abused:      BP 118/82   Pulse 89   Ht 6' (1.829 m)   Wt 202 lb (91.6 kg)   SpO2 97%   BMI 27.40 kg/m   Physical Exam:  Well appearing NAD HEENT: Unremarkable Neck:  No JVD, no thyromegally Lymphatics:  No adenopathy Back:  No CVA tenderness Lungs:  Clear with no wheezes HEART:  Regular rate rhythm, no murmurs, no rubs, no clicks Abd:  soft, positive bowel  sounds, no organomegally, no rebound, no guarding Ext:  2 plus pulses, no edema, no cyanosis, no clubbing Skin:  No rashes no nodules Neuro:  CN II through XII intact, motor grossly intact  EKG - nsr  Assess/Plan: 1. Chronic systolic heart failure - his symptoms are class 2. He will continue his current meds. I have discussed the indications/risks/benefits/goals/expectations of ICD insertion for primary prevention and he wishes to proceed. 2. Syncope - this occurred prior to his initial presentation and has not recurred.   Leonia Reeves.D.

## 2020-03-17 ENCOUNTER — Other Ambulatory Visit: Payer: Self-pay

## 2020-03-17 ENCOUNTER — Encounter (HOSPITAL_COMMUNITY): Payer: Self-pay | Admitting: Emergency Medicine

## 2020-03-17 ENCOUNTER — Emergency Department (HOSPITAL_COMMUNITY): Payer: Medicaid Other

## 2020-03-17 ENCOUNTER — Emergency Department (HOSPITAL_COMMUNITY)
Admission: EM | Admit: 2020-03-17 | Discharge: 2020-03-17 | Disposition: A | Discharge status: Home / Self Care | Payer: Medicaid Other | Attending: Emergency Medicine | Admitting: Emergency Medicine

## 2020-03-17 DIAGNOSIS — R109 Unspecified abdominal pain: Secondary | ICD-10-CM | POA: Diagnosis not present

## 2020-03-17 DIAGNOSIS — Z87891 Personal history of nicotine dependence: Secondary | ICD-10-CM | POA: Diagnosis not present

## 2020-03-17 DIAGNOSIS — I5023 Acute on chronic systolic (congestive) heart failure: Secondary | ICD-10-CM | POA: Diagnosis not present

## 2020-03-17 DIAGNOSIS — J069 Acute upper respiratory infection, unspecified: Secondary | ICD-10-CM

## 2020-03-17 DIAGNOSIS — Z20822 Contact with and (suspected) exposure to covid-19: Secondary | ICD-10-CM | POA: Insufficient documentation

## 2020-03-17 DIAGNOSIS — J45909 Unspecified asthma, uncomplicated: Secondary | ICD-10-CM | POA: Diagnosis not present

## 2020-03-17 DIAGNOSIS — R519 Headache, unspecified: Secondary | ICD-10-CM | POA: Diagnosis present

## 2020-03-17 LAB — COMPREHENSIVE METABOLIC PANEL
ALT: 41 U/L (ref 0–44)
AST: 23 U/L (ref 15–41)
Albumin: 4.5 g/dL (ref 3.5–5.0)
Alkaline Phosphatase: 57 U/L (ref 38–126)
Anion gap: 9 (ref 5–15)
BUN: 15 mg/dL (ref 6–20)
CO2: 22 mmol/L (ref 22–32)
Calcium: 9.2 mg/dL (ref 8.9–10.3)
Chloride: 103 mmol/L (ref 98–111)
Creatinine, Ser: 1.03 mg/dL (ref 0.61–1.24)
GFR calc Af Amer: >60 mL/min (ref >60)
GFR calc non Af Amer: >60 mL/min (ref >60)
Glucose, Bld: 98 mg/dL (ref 70–99)
Potassium: 4.1 mmol/L (ref 3.5–5.1)
Sodium: 134 mmol/L — ABNORMAL LOW (ref 135–145)
Total Bilirubin: 0.9 mg/dL (ref 0.3–1.2)
Total Protein: 7.9 g/dL (ref 6.5–8.1)

## 2020-03-17 LAB — LIPASE, BLOOD: Lipase: 34 U/L (ref 11–51)

## 2020-03-17 LAB — CBC
HCT: 46.6 % (ref 39.0–52.0)
Hemoglobin: 15.5 g/dL (ref 13.0–17.0)
MCH: 32 pg (ref 26.0–34.0)
MCHC: 33.3 g/dL (ref 30.0–36.0)
MCV: 96.3 fL (ref 80.0–100.0)
Platelets: 417 10*3/uL — ABNORMAL HIGH (ref 150–400)
RBC: 4.84 MIL/uL (ref 4.22–5.81)
RDW: 11.8 % (ref 11.5–15.5)
WBC: 8.4 10*3/uL (ref 4.0–10.5)
nRBC: 0 % (ref 0.0–0.2)

## 2020-03-17 LAB — SARS CORONAVIRUS 2 BY RT PCR (HOSPITAL ORDER, PERFORMED IN ~~LOC~~ HOSPITAL LAB): SARS Coronavirus 2: NEGATIVE

## 2020-03-17 LAB — TROPONIN I (HIGH SENSITIVITY): Troponin I (High Sensitivity): 6 ng/L (ref <18)

## 2020-03-17 NOTE — Discharge Instructions (Addendum)
Your laboratory work-up obtained here today was reassuring.  Your history and physical exam is suggestive of a viral infection, I am concerned for COVID-19.  Your laboratory testing is still pending, however it should result tonight.  Please maintain isolation precautions.  It is important that you replace fluids loss through your loose stools with increased oral hydration.  I recommend continued Gatorade's.  I also would like for you to continue eating regular meals despite your diminished appetite.  It is important in order to avoid electrolyte derangement.    Please consider over-the-counter medications for symptomatic relief of your cough, headache, and congestion symptoms.  I would like you to check your temperature regularly and take ibuprofen or Tylenol as needed for fever control.  Return to the ED or seek immediate medical attention should you experience any new or worsening symptoms.

## 2020-03-17 NOTE — ED Triage Notes (Signed)
Pt c/o abdominal pain with n/v/d since Monday evening. Pt also c/o persistent productive cough with green sputum x 3 weeks. Denies fever/sick exposure.

## 2020-03-17 NOTE — ED Provider Notes (Signed)
[facility] EMERGENCY DEPARTMENT Provider Note   CSN: [id number] Arrival date & time: 03/17/20  1237     History Chief Complaint  Patient presents with  . Abdominal Pain  . Cough    Andrew Hinton is a 44 y.o. male with PMH of HFrEF with 25 to 30% EF scheduled for ICD placement 03/22/2020 who presents the ED with 3-day history of headache, fatigue, diminished appetite, congestion and runny nose, cough productive of greenish sputum, crampy abdominal discomfort, nausea, and nonbloody loose stools.  Patient states that he was scheduled to have a COVID-19 test performed the day before his scheduled surgery, however given his collection of symptoms, he came to the ED for evaluation and suspected COVID-19 testing.  Patient states that he did not receive COVID-19 vaccine.  He also endorses shortness of breath symptoms, but states that this is subacute/chronic and related to his HFrEF.  Patient states that he has been taking the DayQuil/NyQuil around-the-clock which has helped his symptoms.  He does endorse subjective fevers and chills while at home.  He also endorses some left-sided chest discomfort that has been persistent since onset of symptoms.  He states that it is a bit worse with exertion, but also states that it is worse with cough.  Patient denies any hemoptysis, history of clots or clotting disorder, pleuritic chest pain, unilateral extremity edema or swelling, hematemesis, melena, hematochezia, or urinary symptoms.     HPI     Past Medical History:  Diagnosis Date  . Anxiety   . Asthma   . Depression   . Nerve damage     Patient Active Problem List   Diagnosis Date Noted  . Chronic systolic heart failure (HCC) 03/16/2020  . Syncope 10/09/2019  . AKI (acute kidney injury) (HCC) 10/09/2019  . Acute systolic CHF (congestive heart failure) (HCC) 10/09/2019  . Subarachnoid hemorrhage (HCC) 10/08/2019    Past Surgical History:  Procedure Laterality Date  . APPENDECTOMY    .  RIGHT/LEFT HEART CATH AND CORONARY ANGIOGRAPHY N/A 10/12/2019   Procedure: RIGHT/LEFT HEART CATH AND CORONARY ANGIOGRAPHY;  Surgeon: Laurey Morale, MD;  Location: [facility] INVASIVE CV LAB;  Service: Cardiovascular;  Laterality: N/A;       No family history on file.  Social History   Tobacco Use  . Smoking status: Former Smoker    Packs/day: 0.50    Types: Cigarettes    Quit date: 10/07/2017    Years since quitting: 2.4  . Smokeless tobacco: Never Used  Vaping Use  . Vaping Use: Never used  Substance Use Topics  . Alcohol use: Yes    Comment: occasionally   . Drug use: No    Home Medications Prior to Admission medications   Medication Sig Start Date End Date Taking? Authorizing Provider  carvedilol (COREG) 6.25 MG tablet Take 1 tablet (6.25 mg total) by mouth 2 (two) times daily with a meal. 03/10/20   Laurey Morale, MD  dapagliflozin propanediol (FARXIGA) 10 MG TABS tablet Take 1 tablet (10 mg total) by mouth daily before breakfast. 02/11/20   Laurey Morale, MD  digoxin (LANOXIN) 0.125 MG tablet Take 1 tablet (0.125 mg total) by mouth daily. 02/11/20   Laurey Morale, MD  eplerenone (INSPRA) 25 MG tablet Take 1 tablet (25 mg total) by mouth daily. 03/10/20   Laurey Morale, MD  meclizine (ANTIVERT) 25 MG tablet Take 0.5 tablets (12.5 mg total) by mouth 3 (three) times daily as needed for dizziness. 11/10/19   Sharol Harness,  Brittainy M, PA-C  sacubitril-valsartan (ENTRESTO) 97-103 MG Take 1 tablet by mouth 2 (two) times daily. 02/05/20   Laurey Morale, MD    Allergies    Spironolactone  Review of Systems   Review of Systems  All other systems reviewed and are negative.   Physical Exam Updated Vital Signs BP 137/87 (BP Location: Right Arm)   Pulse 99   Temp 98 F (36.7 C) (Oral)   Resp 18   Ht 6' (1.829 m)   Wt 91.6 kg   SpO2 100%   BMI 27.40 kg/m   Physical Exam Vitals and nursing note reviewed. Exam conducted with a chaperone present.  Constitutional:       General: He is not in acute distress.    Appearance: He is ill-appearing.  HENT:     Head: Normocephalic and atraumatic.     Nose: Congestion and rhinorrhea present.  Eyes:     General: No scleral icterus.    Conjunctiva/sclera: Conjunctivae normal.  Cardiovascular:     Rate and Rhythm: Normal rate and regular rhythm.     Pulses: Normal pulses.     Heart sounds: Normal heart sounds.  Pulmonary:     Comments: Breath sounds intact bilaterally.  Symmetric chest rise.  No rales or wheezes appreciated.  No respiratory distress or increased respiratory effort.  No accessory muscle use. Abdominal:     Comments: Soft, nondistended.  No tenderness to palpation or guarding.  No overlying skin changes.  Musculoskeletal:     Cervical back: Normal range of motion. No rigidity.     Right lower leg: No edema.     Left lower leg: No edema.  Skin:    General: Skin is dry.     Capillary Refill: Capillary refill takes less than 2 seconds.  Neurological:     Mental Status: He is alert.     GCS: GCS eye subscore is 4. GCS verbal subscore is 5. GCS motor subscore is 6.  Psychiatric:        Mood and Affect: Mood normal.        Behavior: Behavior normal.        Thought Content: Thought content normal.     ED Results / Procedures / Treatments   Labs (all labs ordered are listed, but only abnormal results are displayed) Labs Reviewed  COMPREHENSIVE METABOLIC PANEL - Abnormal; Notable for the following components:      Result Value   Sodium 134 (*)    All other components within normal limits  CBC - Abnormal; Notable for the following components:   Platelets 417 (*)    All other components within normal limits  SARS CORONAVIRUS 2 BY RT PCR (HOSPITAL ORDER, PERFORMED IN [facility])  LIPASE, BLOOD  URINALYSIS, ROUTINE W REFLEX MICROSCOPIC  TROPONIN I (HIGH SENSITIVITY)    EKG EKG Interpretation  Date/Time:  Thursday March 17 2020 19:25:02 EDT Ventricular Rate:  85 PR  Interval:  122 QRS Duration: 88 QT Interval:  348 QTC Calculation: 414 R Axis:   13 Text Interpretation: Normal sinus rhythm Left ventricular hypertrophy with repolarization abnormality ( R in aVL , Romhilt-Estes ) Abnormal ECG No significant change since last tracing Confirmed by Susy Frizzle [phone]) on 03/17/2020 7:36:07 PM   Radiology DG Chest 2 View  Result Date: 03/17/2020 CLINICAL DATA:  Productive cough for 3 weeks EXAM: CHEST - 2 VIEW COMPARISON:  10/21/2019 FINDINGS: The heart size and mediastinal contours are within normal limits. No focal airspace  consolidation, pleural effusion, or pneumothorax. The visualized skeletal structures are unremarkable. IMPRESSION: No active cardiopulmonary disease. Electronically Signed   By: Duanne Guess D.O.   On: 03/17/2020 13:47    Procedures Procedures (including critical care time)  Medications Ordered in ED Medications - No data to display  ED Course  I have reviewed the triage vital signs and the nursing notes.  Pertinent labs & imaging results that were available during my care of the patient were reviewed by me and considered in my medical decision making (see chart for details).    MDM Rules/Calculators/A&P                          Patient's history, physical exam, and question of symptoms is concerning for COVID-19 infection.  Patient has not yet been vaccinated for COVID-19.  He has no significant objection to the vaccine, but simply never got around to it.  Triage noted that he was having abdominal pains which prompted him to come to the ED for evaluation.  However, on my exam he complained more of his collection of generalized complaints that are concerning for viral infection.  Given his very benign abdominal exam, normal laboratory studies, and lack of significant risk factors, I have a very low suspicion for appendicitis, ischemic bowel, bowel perforation, or any other life threatening disease.  He does have a history of  diverticulosis noted on CT, but again, there is no tenderness or other abnormal findings on my exam.  He states that he has not been eating well and that may be contributing factor for his cramping symptoms.  He is having bowel movements regularly, albeit loose.  Do not feel as though imaging is warranted.  Labs obtained are unremarkable.  I added a once-STAT troponin as he has had chest discomfort for the past few days that has been near constant and worse with cough or exertion.  Suspected body aches versus costochondritis, but given his history wanted to obtain a one-time troponin.  Plain films of chest were personally reviewed and demonstrated no evidence of pneumonia or other acute cardiopulmonary findings.  His vital signs have been stable and WNL.  He has been borderline tachycardic, but is tolerating food and liquids without difficulty.  Patient's troponin is WNL at 6 and his EKG is personally reviewed and relatively unchanged from prior tracing.  Patient is safe for discharge at this time.  Suspect that he has active COVID-19 infection.  Encouraging isolation precautions and oral hydration to replace fluids lost with his nonbloody emesis and nonbloody loose stools.  Patient will take over-the-counter medications for symptomatic relief of his cough, headache, and congestion symptoms.  Recommending ibuprofen or Tylenol as needed for fever control.  Encourage him to check his temperature regularly.  Strict ED return precautions discussed.  Patient voices understanding and is agreeable to the plan.  Andrew Hinton was evaluated in Emergency Department on 03/17/2020 for the symptoms described in the history of present illness. He was evaluated in the context of the global COVID-19 pandemic, which necessitated consideration that the patient might be at risk for infection with the SARS-CoV-2 virus that causes COVID-19. Institutional protocols and algorithms that pertain to the evaluation of patients at  risk for COVID-19 are in a state of rapid change based on information released by regulatory bodies including the CDC and federal and state organizations. These policies and algorithms were followed during the patient's care in the ED.  Final Clinical Impression(s) /  ED Diagnoses Final diagnoses:  Viral upper respiratory tract infection    Rx / DC Orders ED Discharge Orders    None       Lorelee New, PA-C 03/17/20 1956    Pollyann Savoy, MD 03/17/20 2215

## 2020-03-19 ENCOUNTER — Other Ambulatory Visit (HOSPITAL_COMMUNITY)
Admission: RE | Admit: 2020-03-19 | Discharge: 2020-03-19 | Disposition: A | Discharge status: Home / Self Care | Payer: Medicaid Other | Source: Ambulatory Visit | Attending: Internal Medicine | Admitting: Internal Medicine

## 2020-03-19 DIAGNOSIS — Z20822 Contact with and (suspected) exposure to covid-19: Secondary | ICD-10-CM | POA: Insufficient documentation

## 2020-03-19 DIAGNOSIS — Z01812 Encounter for preprocedural laboratory examination: Secondary | ICD-10-CM | POA: Diagnosis present

## 2020-03-19 LAB — SARS CORONAVIRUS 2 (TAT 6-24 HRS): SARS Coronavirus 2: NEGATIVE

## 2020-03-21 NOTE — Progress Notes (Signed)
Instructed patient on the following items: Arrival time 0730 Nothing to eat or drink after midnight All meds  AM of procedure except diabetic med [place] Responsible person to drive you home and stay with you for 24 hrs Wash with special soap night before and morning of procedure   Patients says he is feeling better, denies any fevers.

## 2020-03-22 ENCOUNTER — Other Ambulatory Visit (HOSPITAL_COMMUNITY): Payer: Medicaid Other

## 2020-03-22 ENCOUNTER — Ambulatory Visit (HOSPITAL_COMMUNITY): Payer: Medicaid Other

## 2020-03-22 ENCOUNTER — Ambulatory Visit (HOSPITAL_COMMUNITY)
Admission: RE | Admit: 2020-03-22 | Discharge: 2020-03-22 | Disposition: A | Discharge status: Home / Self Care | Payer: Medicaid Other | Attending: Internal Medicine | Admitting: Internal Medicine

## 2020-03-22 ENCOUNTER — Encounter (HOSPITAL_COMMUNITY)
Admission: RE | Disposition: A | Discharge status: Home / Self Care | Payer: Medicaid Other | Source: Home / Self Care | Attending: Internal Medicine

## 2020-03-22 ENCOUNTER — Other Ambulatory Visit: Payer: Self-pay

## 2020-03-22 DIAGNOSIS — I428 Other cardiomyopathies: Secondary | ICD-10-CM | POA: Insufficient documentation

## 2020-03-22 DIAGNOSIS — F329 Major depressive disorder, single episode, unspecified: Secondary | ICD-10-CM | POA: Diagnosis not present

## 2020-03-22 DIAGNOSIS — I5022 Chronic systolic (congestive) heart failure: Secondary | ICD-10-CM | POA: Diagnosis present

## 2020-03-22 DIAGNOSIS — F419 Anxiety disorder, unspecified: Secondary | ICD-10-CM | POA: Insufficient documentation

## 2020-03-22 DIAGNOSIS — J45909 Unspecified asthma, uncomplicated: Secondary | ICD-10-CM | POA: Insufficient documentation

## 2020-03-22 DIAGNOSIS — I255 Ischemic cardiomyopathy: Secondary | ICD-10-CM | POA: Diagnosis not present

## 2020-03-22 DIAGNOSIS — Z006 Encounter for examination for normal comparison and control in clinical research program: Secondary | ICD-10-CM | POA: Diagnosis not present

## 2020-03-22 DIAGNOSIS — R55 Syncope and collapse: Secondary | ICD-10-CM | POA: Diagnosis not present

## 2020-03-22 DIAGNOSIS — Z95810 Presence of automatic (implantable) cardiac defibrillator: Secondary | ICD-10-CM

## 2020-03-22 DIAGNOSIS — Z87891 Personal history of nicotine dependence: Secondary | ICD-10-CM | POA: Insufficient documentation

## 2020-03-22 SURGERY — ICD IMPLANT

## 2020-03-22 MED ORDER — HEPARIN (PORCINE) IN NACL 1000-0.9 UT/500ML-% IV SOLN
INTRAVENOUS | Status: AC
  Filled 2020-03-22: qty 500

## 2020-03-22 MED ORDER — LIDOCAINE HCL (PF) 1 % IJ SOLN
INTRAMUSCULAR | Status: AC
  Filled 2020-03-22: qty 60

## 2020-03-22 MED ORDER — MIDAZOLAM HCL 5 MG/5ML IJ SOLN
INTRAMUSCULAR | Status: AC
  Filled 2020-03-22: qty 5

## 2020-03-22 MED ORDER — SODIUM CHLORIDE 0.9 % IV SOLN
80.0000 mg | INTRAVENOUS | Status: AC
  Administered 2020-03-22: 80 mg
  Filled 2020-03-22: qty 2

## 2020-03-22 MED ORDER — CEFAZOLIN SODIUM-DEXTROSE 2-4 GM/100ML-% IV SOLN
INTRAVENOUS | Status: AC
  Filled 2020-03-22: qty 100

## 2020-03-22 MED ORDER — LIDOCAINE HCL (PF) 1 % IJ SOLN
INTRAMUSCULAR | Status: DC | PRN
  Administered 2020-03-22: 50 mL

## 2020-03-22 MED ORDER — HEPARIN (PORCINE) IN NACL 1000-0.9 UT/500ML-% IV SOLN
INTRAVENOUS | Status: DC | PRN
  Administered 2020-03-22: 500 mL

## 2020-03-22 MED ORDER — ONDANSETRON HCL 4 MG/2ML IJ SOLN: 4.0000 mg | Freq: Four times a day (QID) | INTRAMUSCULAR | Status: DC | PRN

## 2020-03-22 MED ORDER — CEFAZOLIN SODIUM-DEXTROSE 1-4 GM/50ML-% IV SOLN
1.0000 g | INTRAVENOUS | Status: AC
  Administered 2020-03-22: 1 g via INTRAVENOUS
  Filled 2020-03-22: qty 50

## 2020-03-22 MED ORDER — SODIUM CHLORIDE 0.9 % IV SOLN: INTRAVENOUS | Status: DC

## 2020-03-22 MED ORDER — CHLORHEXIDINE GLUCONATE 4 % EX LIQD: 4.0000 "application " | Freq: Once | CUTANEOUS | Status: DC

## 2020-03-22 MED ORDER — FENTANYL CITRATE (PF) 100 MCG/2ML IJ SOLN
INTRAMUSCULAR | Status: AC
  Filled 2020-03-22: qty 2

## 2020-03-22 MED ORDER — ACETAMINOPHEN 325 MG PO TABS
325.0000 mg | ORAL_TABLET | ORAL | Status: DC | PRN
  Administered 2020-03-22: 650 mg via ORAL
  Filled 2020-03-22 (×3): qty 2

## 2020-03-22 MED ORDER — MIDAZOLAM HCL 5 MG/5ML IJ SOLN
INTRAMUSCULAR | Status: DC | PRN
  Administered 2020-03-22: 1 mg via INTRAVENOUS
  Administered 2020-03-22: 2 mg via INTRAVENOUS
  Administered 2020-03-22 (×3): 1 mg via INTRAVENOUS
  Administered 2020-03-22: 2 mg via INTRAVENOUS
  Administered 2020-03-22 (×3): 1 mg via INTRAVENOUS

## 2020-03-22 MED ORDER — FENTANYL CITRATE (PF) 100 MCG/2ML IJ SOLN
INTRAMUSCULAR | Status: DC | PRN
  Administered 2020-03-22: 25 ug via INTRAVENOUS
  Administered 2020-03-22 (×3): 12.5 ug via INTRAVENOUS
  Administered 2020-03-22: 25 ug via INTRAVENOUS
  Administered 2020-03-22: 12.5 ug via INTRAVENOUS

## 2020-03-22 MED ORDER — SODIUM CHLORIDE 0.9 % IV SOLN
INTRAVENOUS | Status: AC
  Filled 2020-03-22: qty 2

## 2020-03-22 MED ORDER — CEFAZOLIN SODIUM-DEXTROSE 2-4 GM/100ML-% IV SOLN
2.0000 g | INTRAVENOUS | Status: AC
  Administered 2020-03-22: 2 g via INTRAVENOUS

## 2020-03-22 SURGICAL SUPPLY — 6 items
CABLE SURGICAL S-101-97-12 (CABLE) ×2 IMPLANT
ICD VIGILANT VR D232 (Pacemaker) ×2 IMPLANT
LEAD RELIANCE IS4 AF 64 0273 (Lead) ×2 IMPLANT
PAD PRO RADIOLUCENT 2001M-C (PAD) ×2 IMPLANT
SHEATH 9FR PRELUDE SNAP 13 (SHEATH) ×2 IMPLANT
TRAY PACEMAKER INSERTION (PACKS) ×2 IMPLANT

## 2020-03-22 NOTE — Progress Notes (Signed)
Dr Ladona Ridgel in and client c/o 10/10 left shoulder and ICD site pain and no new orders noted; client given tylenol for pain

## 2020-03-22 NOTE — Discharge Instructions (Signed)
After Your ICD (Implantable Cardiac Defibrillator)   . You have a [employer] ICD  ACTIVITY . Do not lift your arm above shoulder height for 1 week after your procedure. After 7 days, you may progress as below.     Tuesday March 29, 2020  Wednesday March 30, 2020 Thursday March 31, 2020 Friday April 01, 2020   . Do not lift, push, pull, or carry anything over 10 pounds with the affected arm until 6 weeks (Tuesday May 03, 2020 ) after your procedure.   . Do NOT DRIVE until you have been seen for your wound check, or as long as instructed by your healthcare provider.   . Ask your healthcare provider when you can go back to work   INCISION/Dressing . If you are on a blood thinner such as Coumadin, Xarelto, Eliquis, Plavix, or Pradaxa please confirm with your provider when this should be resumed.   . Monitor your defibrillator site for redness, swelling, and drainage. Call the device clinic at [phone] if you experience these symptoms or fever/chills.  ::"If your incision is sealed with Steri-strips or staples, you may shower 10 days after your procedure or when told by your provider. Do not remove the steri-strips or let the shower hit directly on your site. You may wash around your site with soap and water", . Avoid lotions, ointments, or perfumes over your incision until it is well-healed.  . You may use a hot tub or a pool AFTER your wound check appointment if the incision is completely closed.  . Your ICD is designed to protect you from life threatening heart rhythms. Because of this, you may receive a shock.   o 1 shock with no symptoms:  Call the office during business hours. o 1 shock with symptoms (chest pain, chest pressure, dizziness, lightheadedness, shortness of breath, overall feeling unwell):  Call 911. o If you experience 2 or more shocks in 24 hours:  Call 911. o If you receive a shock, you should not drive for 6 months per the [place] DMV IF you receive  appropriate therapy from your ICD.   . ICD Alerts:  Some alerts are vibratory and others beep. These are NOT emergencies. Please call our office to let us know. If this occurs at night or on weekends, it can wait until the next business day. Send a remote transmission.  . If your device is capable of reading fluid status (for heart failure), you will be offered monthly monitoring to review this with you.   DEVICE MANAGEMENT . Remote monitoring is used to monitor your ICD from home. This monitoring is scheduled every 91 days by our office. It allows [place] to keep an eye on the functioning of your device to ensure it is working properly. You will routinely see your Electrophysiologist annually (more often if necessary).   . You should receive your ID card for your new device in 4-8 weeks. Keep this card with you at all times once received. Consider wearing a medical alert bracelet or necklace.  . Your ICD  may be MRI compatible. This will be discussed at your next office visit/wound check.  You should avoid contact with strong electric or magnetic fields.    Do not use amateur (ham) radio equipment or electric (arc) welding torches. MP3 player headphones with magnets should not be used. Some devices are safe to use if held at least 12 inches (30 cm) from your defibrillator. These include power tools, lawn mowers, and speakers.  If you are unsure if something is safe to use, ask your health care provider.   When using your cell phone, hold it to the ear that is on the opposite side from the defibrillator. Do not leave your cell phone in a pocket over the defibrillator.   You may safely use electric blankets, heating pads, computers, and microwave ovens.  Call the office right away if:  You have chest pain.  You feel more than one shock.  You feel more short of breath than you have felt before.  You feel more light-headed than you have felt before.  Your incision starts to open up.  This  information is not intended to replace advice given to you by your health care provider. Make sure you discuss any questions you have with your health care provider.

## 2020-03-22 NOTE — Interval H&P Note (Signed)
History and Physical Interval Note:  03/22/2020 9:18 AM  Andrew Hinton  has presented today for surgery, with the diagnosis of cardiomyopathy.  The various methods of treatment have been discussed with the patient and family. After consideration of risks, benefits and other options for treatment, the patient has consented to  Procedure(s): ICD IMPLANT (N/A) as a surgical intervention.  The patient's history has been reviewed, patient examined, no change in status, stable for surgery.  I have reviewed the patient's chart and labs.  Questions were answered to the patient's satisfaction.     Lewayne Bunting

## 2020-03-23 ENCOUNTER — Encounter (HOSPITAL_COMMUNITY): Payer: Self-pay | Admitting: Internal Medicine

## 2020-03-29 ENCOUNTER — Telehealth (HOSPITAL_COMMUNITY): Payer: Self-pay | Admitting: *Deleted

## 2020-03-29 NOTE — Telephone Encounter (Signed)
Records faxed to [facility] clinic at [phone] as requested by pt per signed records release form.

## 2020-04-05 ENCOUNTER — Ambulatory Visit (INDEPENDENT_AMBULATORY_CARE_PROVIDER_SITE_OTHER): Payer: Medicaid Other | Admitting: Emergency Medicine

## 2020-04-05 ENCOUNTER — Other Ambulatory Visit: Payer: Self-pay

## 2020-04-05 DIAGNOSIS — I5022 Chronic systolic (congestive) heart failure: Secondary | ICD-10-CM

## 2020-04-05 DIAGNOSIS — R55 Syncope and collapse: Secondary | ICD-10-CM | POA: Diagnosis not present

## 2020-04-05 DIAGNOSIS — Z95810 Presence of automatic (implantable) cardiac defibrillator: Secondary | ICD-10-CM

## 2020-04-06 NOTE — Progress Notes (Signed)
Wound check appointment. Steri-strips removed. Wound without redness or edema. Incision edges approximated, wound well healed. Normal device function. Thresholds, sensing, and impedances consistent with implant measurements. Device programmed at 3.5V for extra safety margin until 3 month visit. Histogram distribution appropriate for patient and level of activity. No ventricular arrhythmias noted since DFT tested. Patient educated about wound care, arm mobility, lifting restrictions, shock plan. ROV with  Dr Ladona Ridgel 07/05/20. Next home remote 06/28/20.

## 2020-04-14 ENCOUNTER — Other Ambulatory Visit: Payer: Self-pay

## 2020-04-14 ENCOUNTER — Encounter (HOSPITAL_COMMUNITY): Payer: Self-pay

## 2020-04-14 ENCOUNTER — Emergency Department (HOSPITAL_COMMUNITY)
Admission: EM | Admit: 2020-04-14 | Discharge: 2020-04-14 | Disposition: A | Discharge status: Home / Self Care | Payer: Medicaid Other | Attending: Emergency Medicine | Admitting: Emergency Medicine

## 2020-04-14 DIAGNOSIS — R0981 Nasal congestion: Secondary | ICD-10-CM | POA: Diagnosis present

## 2020-04-14 DIAGNOSIS — Z79899 Other long term (current) drug therapy: Secondary | ICD-10-CM | POA: Insufficient documentation

## 2020-04-14 DIAGNOSIS — Z87891 Personal history of nicotine dependence: Secondary | ICD-10-CM | POA: Diagnosis not present

## 2020-04-14 DIAGNOSIS — J019 Acute sinusitis, unspecified: Secondary | ICD-10-CM | POA: Insufficient documentation

## 2020-04-14 DIAGNOSIS — J45909 Unspecified asthma, uncomplicated: Secondary | ICD-10-CM | POA: Insufficient documentation

## 2020-04-14 DIAGNOSIS — Z955 Presence of coronary angioplasty implant and graft: Secondary | ICD-10-CM | POA: Insufficient documentation

## 2020-04-14 DIAGNOSIS — I5022 Chronic systolic (congestive) heart failure: Secondary | ICD-10-CM | POA: Diagnosis not present

## 2020-04-14 DIAGNOSIS — Z20822 Contact with and (suspected) exposure to covid-19: Secondary | ICD-10-CM | POA: Insufficient documentation

## 2020-04-14 DIAGNOSIS — J0100 Acute maxillary sinusitis, unspecified: Secondary | ICD-10-CM

## 2020-04-14 LAB — SARS CORONAVIRUS 2 BY RT PCR (HOSPITAL ORDER, PERFORMED IN ~~LOC~~ HOSPITAL LAB): SARS Coronavirus 2: NEGATIVE

## 2020-04-14 MED ORDER — AMOXICILLIN-POT CLAVULANATE 875-125 MG PO TABS
1.0000 | ORAL_TABLET | Freq: Two times a day (BID) | ORAL | 0 refills | Status: DC
Start: 2020-04-14 — End: 2020-08-29

## 2020-04-14 NOTE — Discharge Instructions (Addendum)
Take the entire course of the antibiotic as prescribed.  Additional treatments that may help with nasal congestion include strong mints such as Altoid mints, or Vicks nasal inhaler, steamy bathroom for temporary relief of sinus pressure.  Of course, you may also continue using the flonase nasal spray.  Get rechecked if you develop any new or worsening symptoms.

## 2020-04-14 NOTE — ED Triage Notes (Signed)
Pt has been having congestion for the last 2 weeks as well as bilateral ear pain. He has had coughing as well. Pt has not had COVID vaccines. Has tried Nyquil and Dayquil.

## 2020-04-14 NOTE — ED Provider Notes (Signed)
[facility] EMERGENCY DEPARTMENT Provider Note   CSN: [id number] Arrival date & time: 04/14/20  0740     History Chief Complaint  Patient presents with  . Nasal Congestion    Andrew Hinton is a 44 y.o. male with a history significant for asthma, also history of CHF presenting with a 2-week history of nasal congestion along with sinus pressure and pain which is becoming worse despite treatments with DayQuil, NyQuil and also an OTC nasal decongestant spray.  He endorses thick nasal discharge with occasional bloody streaking.  He also reports aching of his upper teeth, denies any gingival swelling or dental concerns however.  He does have bilateral ear pain and pressure, right greater than left.  He has had a nonproductive cough, denies shortness of breath and has been afebrile.  He is not Covid vaccinated, denies any known exposures but is concerned about this possibility as well.  HPI     Past Medical History:  Diagnosis Date  . Anxiety   . Asthma   . Depression   . Nerve damage     Patient Active Problem List   Diagnosis Date Noted  . Chronic systolic heart failure (HCC) 03/16/2020  . Syncope 10/09/2019  . AKI (acute kidney injury) (HCC) 10/09/2019  . Acute systolic CHF (congestive heart failure) (HCC) 10/09/2019  . Subarachnoid hemorrhage (HCC) 10/08/2019    Past Surgical History:  Procedure Laterality Date  . APPENDECTOMY    . ICD IMPLANT N/A 03/22/2020   Procedure: ICD IMPLANT;  Surgeon: Marinus Maw, MD;  Location: [facility] INVASIVE CV LAB;  Service: Cardiovascular;  Laterality: N/A;  . RIGHT/LEFT HEART CATH AND CORONARY ANGIOGRAPHY N/A 10/12/2019   Procedure: RIGHT/LEFT HEART CATH AND CORONARY ANGIOGRAPHY;  Surgeon: Laurey Morale, MD;  Location: [facility] INVASIVE CV LAB;  Service: Cardiovascular;  Laterality: N/A;       No family history on file.  Social History   Tobacco Use  . Smoking status: Former Smoker    Packs/day: 0.50    Types: Cigarettes    Quit date:  10/07/2017    Years since quitting: 2.5  . Smokeless tobacco: Never Used  Vaping Use  . Vaping Use: Never used  Substance Use Topics  . Alcohol use: Yes    Comment: occasionally   . Drug use: No    Home Medications Prior to Admission medications   Medication Sig Start Date End Date Taking? Authorizing Provider  amoxicillin-clavulanate (AUGMENTIN) 875-125 MG tablet Take 1 tablet by mouth every 12 (twelve) hours. 04/14/20   Burgess Amor, PA-C  carvedilol (COREG) 6.25 MG tablet Take 1 tablet (6.25 mg total) by mouth 2 (two) times daily with a meal. 03/10/20   Laurey Morale, MD  dapagliflozin propanediol (FARXIGA) 10 MG TABS tablet Take 1 tablet (10 mg total) by mouth daily before breakfast. 02/11/20   Laurey Morale, MD  digoxin (LANOXIN) 0.125 MG tablet Take 1 tablet (0.125 mg total) by mouth daily. 02/11/20   Laurey Morale, MD  eplerenone (INSPRA) 25 MG tablet Take 1 tablet (25 mg total) by mouth daily. 03/10/20   Laurey Morale, MD  meclizine (ANTIVERT) 25 MG tablet Take 0.5 tablets (12.5 mg total) by mouth 3 (three) times daily as needed for dizziness. 11/10/19   Robbie Lis M, PA-C  sacubitril-valsartan (ENTRESTO) 97-103 MG Take 1 tablet by mouth 2 (two) times daily. 02/05/20   Laurey Morale, MD    Allergies    Spironolactone  Review of Systems   Review  of Systems  Constitutional: Negative for chills and fever.  HENT: Positive for congestion, ear pain, rhinorrhea and sinus pain. Negative for dental problem, facial swelling, sinus pressure, sore throat, trouble swallowing and voice change.   Eyes: Negative for discharge.  Respiratory: Positive for cough. Negative for shortness of breath, wheezing and stridor.   Cardiovascular: Negative for chest pain.  Gastrointestinal: Negative for abdominal pain.  Genitourinary: Negative.     Physical Exam Updated Vital Signs BP (!) 145/97 (BP Location: Right Arm)   Pulse 67   Temp (!) 97.5 F (36.4 C) (Oral)   Resp 16   Ht 6'  (1.829 m)   Wt 93 kg   SpO2 100%   BMI 27.80 kg/m   Physical Exam Vitals and nursing note reviewed.  Constitutional:      Appearance: He is well-developed.  HENT:     Head: Normocephalic and atraumatic.     Right Ear: Tympanic membrane normal.     Left Ear: Tympanic membrane normal.     Nose: Congestion and rhinorrhea present.     Right Sinus: Maxillary sinus tenderness present.     Left Sinus: Maxillary sinus tenderness present.  Eyes:     Conjunctiva/sclera: Conjunctivae normal.  Cardiovascular:     Rate and Rhythm: Normal rate and regular rhythm.     Heart sounds: Normal heart sounds.  Pulmonary:     Effort: Pulmonary effort is normal.     Breath sounds: Normal breath sounds. No stridor. No wheezing or rhonchi.  Musculoskeletal:        General: Normal range of motion.     Cervical back: Normal range of motion.  Skin:    General: Skin is warm and dry.  Neurological:     Mental Status: He is alert.     ED Results / Procedures / Treatments   Labs (all labs ordered are listed, but only abnormal results are displayed) Labs Reviewed  SARS CORONAVIRUS 2 BY RT PCR (HOSPITAL ORDER, PERFORMED IN [facility] LAB)    EKG None  Radiology No results found.  Procedures Procedures (including critical care time)  Medications Ordered in ED Medications - No data to display  ED Course  I have reviewed the triage vital signs and the nursing notes.  Pertinent labs & imaging results that were available during my care of the patient were reviewed by me and considered in my medical decision making (see chart for details).    MDM Rules/Calculators/A&P                          Patient was Covid test is negative which was relayed to patient.  His exam and history strongly suggest acute maxillary sinusitis.  He was placed on Augmentin, also encouraged to continue his nasal decongestant.  He was also given additional recommendations for home treatment for symptom relief.   Return precautions were outlined, plan follow-up with PCP for any persistent or worsening symptoms. Final Clinical Impression(s) / ED Diagnoses Final diagnoses:  Acute non-recurrent maxillary sinusitis    Rx / DC Orders ED Discharge Orders         Ordered    amoxicillin-clavulanate (AUGMENTIN) 875-125 MG tablet  Every 12 hours        04/14/20 1036           Burgess Amor, PA-C 04/14/20 1635    Milagros Loll, MD 04/19/20 1537

## 2020-04-20 ENCOUNTER — Other Ambulatory Visit: Payer: Self-pay

## 2020-04-20 ENCOUNTER — Encounter (HOSPITAL_COMMUNITY): Payer: Self-pay | Admitting: *Deleted

## 2020-04-20 ENCOUNTER — Emergency Department (HOSPITAL_COMMUNITY)
Admission: EM | Admit: 2020-04-20 | Discharge: 2020-04-20 | Disposition: A | Discharge status: Home / Self Care | Payer: Medicaid Other | Attending: Emergency Medicine | Admitting: Emergency Medicine

## 2020-04-20 ENCOUNTER — Emergency Department (HOSPITAL_COMMUNITY): Payer: Medicaid Other

## 2020-04-20 DIAGNOSIS — Z20822 Contact with and (suspected) exposure to covid-19: Secondary | ICD-10-CM | POA: Insufficient documentation

## 2020-04-20 DIAGNOSIS — J45909 Unspecified asthma, uncomplicated: Secondary | ICD-10-CM | POA: Diagnosis not present

## 2020-04-20 DIAGNOSIS — J0101 Acute recurrent maxillary sinusitis: Secondary | ICD-10-CM | POA: Diagnosis not present

## 2020-04-20 DIAGNOSIS — I5021 Acute systolic (congestive) heart failure: Secondary | ICD-10-CM | POA: Insufficient documentation

## 2020-04-20 DIAGNOSIS — R630 Anorexia: Secondary | ICD-10-CM | POA: Insufficient documentation

## 2020-04-20 DIAGNOSIS — R531 Weakness: Secondary | ICD-10-CM | POA: Insufficient documentation

## 2020-04-20 DIAGNOSIS — R112 Nausea with vomiting, unspecified: Secondary | ICD-10-CM | POA: Insufficient documentation

## 2020-04-20 DIAGNOSIS — Z87891 Personal history of nicotine dependence: Secondary | ICD-10-CM | POA: Insufficient documentation

## 2020-04-20 DIAGNOSIS — Z79899 Other long term (current) drug therapy: Secondary | ICD-10-CM | POA: Insufficient documentation

## 2020-04-20 DIAGNOSIS — R079 Chest pain, unspecified: Secondary | ICD-10-CM | POA: Insufficient documentation

## 2020-04-20 DIAGNOSIS — R0981 Nasal congestion: Secondary | ICD-10-CM | POA: Diagnosis present

## 2020-04-20 LAB — LIPASE, BLOOD: Lipase: 33 U/L (ref 11–51)

## 2020-04-20 LAB — BASIC METABOLIC PANEL
Anion gap: 12 (ref 5–15)
BUN: 19 mg/dL (ref 6–20)
CO2: 26 mmol/L (ref 22–32)
Calcium: 9.5 mg/dL (ref 8.9–10.3)
Chloride: 101 mmol/L (ref 98–111)
Creatinine, Ser: 1.22 mg/dL (ref 0.61–1.24)
GFR calc Af Amer: >60 mL/min (ref >60)
GFR calc non Af Amer: >60 mL/min (ref >60)
Glucose, Bld: 132 mg/dL — ABNORMAL HIGH (ref 70–99)
Potassium: 4.2 mmol/L (ref 3.5–5.1)
Sodium: 139 mmol/L (ref 135–145)

## 2020-04-20 LAB — CBC WITH DIFFERENTIAL/PLATELET
Abs Immature Granulocytes: 0.03 10*3/uL (ref 0.00–0.07)
Basophils Absolute: 0.2 10*3/uL — ABNORMAL HIGH (ref 0.0–0.1)
Basophils Relative: 2 %
Eosinophils Absolute: 1.2 10*3/uL — ABNORMAL HIGH (ref 0.0–0.5)
Eosinophils Relative: 13 %
HCT: 48.3 % (ref 39.0–52.0)
Hemoglobin: 16.1 g/dL (ref 13.0–17.0)
Immature Granulocytes: 0 %
Lymphocytes Relative: 27 %
Lymphs Abs: 2.6 10*3/uL (ref 0.7–4.0)
MCH: 32.3 pg (ref 26.0–34.0)
MCHC: 33.3 g/dL (ref 30.0–36.0)
MCV: 96.8 fL (ref 80.0–100.0)
Monocytes Absolute: 0.7 10*3/uL (ref 0.1–1.0)
Monocytes Relative: 8 %
Neutro Abs: 4.9 10*3/uL (ref 1.7–7.7)
Neutrophils Relative %: 50 %
Platelets: 333 10*3/uL (ref 150–400)
RBC: 4.99 MIL/uL (ref 4.22–5.81)
RDW: 12.5 % (ref 11.5–15.5)
WBC: 9.6 10*3/uL (ref 4.0–10.5)
nRBC: 0 % (ref 0.0–0.2)

## 2020-04-20 LAB — TROPONIN I (HIGH SENSITIVITY)
Troponin I (High Sensitivity): 3 ng/L (ref <18)
Troponin I (High Sensitivity): 4 ng/L (ref <18)

## 2020-04-20 LAB — HEPATIC FUNCTION PANEL
ALT: 45 U/L — ABNORMAL HIGH (ref 0–44)
AST: 26 U/L (ref 15–41)
Albumin: 4.6 g/dL (ref 3.5–5.0)
Alkaline Phosphatase: 53 U/L (ref 38–126)
Bilirubin, Direct: 0.1 mg/dL (ref 0.0–0.2)
Indirect Bilirubin: 0.9 mg/dL (ref 0.3–0.9)
Total Bilirubin: 1 mg/dL (ref 0.3–1.2)
Total Protein: 8.1 g/dL (ref 6.5–8.1)

## 2020-04-20 LAB — SARS CORONAVIRUS 2 BY RT PCR (HOSPITAL ORDER, PERFORMED IN ~~LOC~~ HOSPITAL LAB): SARS Coronavirus 2: NEGATIVE

## 2020-04-20 MED ORDER — PREDNISONE 20 MG PO TABS: 20.0000 mg | ORAL_TABLET | Freq: Every day | ORAL | 0 refills | Status: AC

## 2020-04-20 MED ORDER — CLINDAMYCIN HCL 300 MG PO CAPS: 300.0000 mg | ORAL_CAPSULE | Freq: Three times a day (TID) | ORAL | 0 refills | Status: AC

## 2020-04-20 MED ORDER — ONDANSETRON HCL 4 MG/2ML IJ SOLN
4.0000 mg | Freq: Once | INTRAMUSCULAR | Status: AC
  Administered 2020-04-20: 4 mg via INTRAVENOUS
  Filled 2020-04-20: qty 2

## 2020-04-20 MED ORDER — CLINDAMYCIN HCL 150 MG PO CAPS
300.0000 mg | ORAL_CAPSULE | Freq: Once | ORAL | Status: AC
  Administered 2020-04-20: 300 mg via ORAL
  Filled 2020-04-20: qty 2

## 2020-04-20 NOTE — ED Provider Notes (Signed)
[facility] EMERGENCY DEPARTMENT Provider Note   CSN: [id number] Arrival date & time: 04/20/20  1311    History Chief Complaint  Patient presents with  . Emesis    Andrew Hinton is a 44 y.o. male with with past history significant for asthma, anxiety, heart failure, subarachnoid hemorrhage post fall who presents for evaluation of upper respiratory complaints.  Patient states over the last 3 weeks he has had nasal congestion, sinus pressure, pain, sinus headache as well as body aches and pains.  He feels like he has had a fever however has not taken his temperature.  Has a nonproductive cough.  He endorses thick, purulent nasal discharge.  Feels like he "hurts all over."  He denies any shortness of breath.  States he has chronic pain in his chest which has been present since his defibrillator placement greater than 1 month ago.  Patient states this is not changed.  His pain is not pleuritic or exertional in nature.  Does not radiate to left arm, left jaw.  Patient states over the last 3 days he has had 4 episodes of NBNB emesis.  He denies any focal abdominal pain.  States he has had "soft stool" however denies symptoms diarrhea.  No melena or bright blood per rectum.  Patient states he was exposed to Covid.  He is not Covid vaccinated.  He denies any sign of thunderclap headache, recent head trauma, head injury.  No neck pain, neck stiffness, facial droop, paresthesias, difficulty with word finding, hemoptysis, diarrhea, dysuria, rashes, lesions.  He was prescribed Augmentin and started this on 04/15/2020.  He did not take his dose today.  He has not been using any additional medications for his symptoms.  He has been compliant with his heart failure medications.  Denies additional aggravating or alleviating factor. Patient states he was also given and additional antibiotic prior to Augmentin however this prior ABx did not work. He is unsure of the name, possibly ?Doxycycline?   History obtained  from patient and past medical records.  No interpreter is used.  HPI     Past Medical History:  Diagnosis Date  . Anxiety   . Asthma   . Depression   . Nerve damage     Patient Active Problem List   Diagnosis Date Noted  . Chronic systolic heart failure (HCC) 03/16/2020  . Syncope 10/09/2019  . AKI (acute kidney injury) (HCC) 10/09/2019  . Acute systolic CHF (congestive heart failure) (HCC) 10/09/2019  . Subarachnoid hemorrhage (HCC) 10/08/2019    Past Surgical History:  Procedure Laterality Date  . APPENDECTOMY    . ICD IMPLANT N/A 03/22/2020   Procedure: ICD IMPLANT;  Surgeon: Marinus Maw, MD;  Location: [facility] INVASIVE CV LAB;  Service: Cardiovascular;  Laterality: N/A;  . RIGHT/LEFT HEART CATH AND CORONARY ANGIOGRAPHY N/A 10/12/2019   Procedure: RIGHT/LEFT HEART CATH AND CORONARY ANGIOGRAPHY;  Surgeon: Laurey Morale, MD;  Location: [facility] INVASIVE CV LAB;  Service: Cardiovascular;  Laterality: N/A;      History reviewed. No pertinent family history.  Social History   Tobacco Use  . Smoking status: Former Smoker    Packs/day: 0.50    Types: Cigarettes    Quit date: 10/07/2017    Years since quitting: 2.5  . Smokeless tobacco: Never Used  Vaping Use  . Vaping Use: Never used  Substance Use Topics  . Alcohol use: Yes    Comment: occasionally   . Drug use: No    Home Medications Prior to  Admission medications   Medication Sig Start Date End Date Taking? Authorizing Provider  amoxicillin-clavulanate (AUGMENTIN) 875-125 MG tablet Take 1 tablet by mouth every 12 (twelve) hours. 04/14/20  Yes Idol, Raynelle Fanning, PA-C  carvedilol (COREG) 6.25 MG tablet Take 1 tablet (6.25 mg total) by mouth 2 (two) times daily with a meal. 03/10/20  Yes Laurey Morale, MD  dapagliflozin propanediol (FARXIGA) 10 MG TABS tablet Take 1 tablet (10 mg total) by mouth daily before breakfast. 02/11/20  Yes Laurey Morale, MD  digoxin (LANOXIN) 0.125 MG tablet Take 1 tablet (0.125 mg total) by mouth  daily. 02/11/20  Yes Laurey Morale, MD  eplerenone (INSPRA) 25 MG tablet Take 1 tablet (25 mg total) by mouth daily. 03/10/20  Yes Laurey Morale, MD  meclizine (ANTIVERT) 25 MG tablet Take 0.5 tablets (12.5 mg total) by mouth 3 (three) times daily as needed for dizziness. 11/10/19  Yes Simmons, Brittainy M, PA-C  sacubitril-valsartan (ENTRESTO) 97-103 MG Take 1 tablet by mouth 2 (two) times daily. 02/05/20  Yes Laurey Morale, MD  clindamycin (CLEOCIN) 300 MG capsule Take 1 capsule (300 mg total) by mouth 3 (three) times daily for 7 days. 04/20/20 04/27/20  Laureano Hetzer A, PA-C  predniSONE (DELTASONE) 20 MG tablet Take 1 tablet (20 mg total) by mouth daily for 7 days. 04/20/20 04/27/20  Deundre Thong A, PA-C    Allergies    Spironolactone  Review of Systems   Review of Systems  Constitutional: Positive for activity change, appetite change, chills, fatigue and fever (Subjective).  HENT: Positive for congestion, postnasal drip, rhinorrhea, sinus pressure, sinus pain, sneezing and sore throat (Scratchy throat). Negative for ear pain, facial swelling, hearing loss, nosebleeds, trouble swallowing and voice change.   Eyes: Negative.   Respiratory: Positive for cough. Negative for apnea, choking, chest tightness, shortness of breath, wheezing and stridor.   Cardiovascular: Positive for chest pain (Chronic, no new pain). Negative for palpitations and leg swelling.  Gastrointestinal: Positive for nausea and vomiting. Negative for abdominal distention, abdominal pain, anal bleeding, blood in stool, constipation, diarrhea and rectal pain.  Genitourinary: Negative.   Musculoskeletal: Negative.   Skin: Negative.   Neurological: Positive for weakness (Generalized) and headaches (Frontal, sinus HA). Negative for dizziness, tremors, seizures, syncope, facial asymmetry, speech difficulty, light-headedness and numbness.    Physical Exam Updated Vital Signs BP (!) 109/98   Pulse 74   Temp 98.8 F (37.1  C) (Oral)   Resp 18   Ht 6' (1.829 m)   Wt 93 kg   SpO2 100%   BMI 27.80 kg/m   Physical Exam Vitals and nursing note reviewed.  Constitutional:      General: He is not in acute distress.    Appearance: He is not ill-appearing, toxic-appearing or diaphoretic.  HENT:     Head: Normocephalic and atraumatic.     Jaw: There is normal jaw occlusion.     Right Ear: Tympanic membrane, ear canal and external ear normal. There is no impacted cerumen. No hemotympanum. Tympanic membrane is not injected, scarred, perforated, erythematous, retracted or bulging.     Left Ear: Tympanic membrane, ear canal and external ear normal. There is no impacted cerumen. No hemotympanum. Tympanic membrane is not injected, scarred, perforated, erythematous, retracted or bulging.     Ears:     Comments: No Mastoid tenderness.    Nose: Congestion and rhinorrhea present.     Comments: Purulent  rhinorrhea and congestion to bilateral nares. Tenderness over maxillary and  frontal sinuses    Mouth/Throat:     Mouth: Mucous membranes are moist.     Comments: Posterior oropharynx clear.  Mucous membranes moist.  Tonsils without erythema or exudate.  Uvula midline without deviation.  No evidence of PTA or RPA.  No drooling, dysphasia or trismus.  Phonation normal. Eyes:     Extraocular Movements: Extraocular movements intact.     Conjunctiva/sclera: Conjunctivae normal.  Neck:     Trachea: Trachea and phonation normal.     Meningeal: Brudzinski's sign and Kernig's sign absent.     Comments: No Neck stiffness or neck rigidity.  No meningismus.  No cervical lymphadenopathy. Cardiovascular:     Rate and Rhythm: Normal rate.     Pulses: Normal pulses.     Heart sounds: Normal heart sounds.     Comments: No murmurs rubs or gallops. Pulmonary:     Effort: Pulmonary effort is normal.     Breath sounds: Normal breath sounds.     Comments: Clear to auscultation bilaterally without wheeze, rhonchi or rales.  No accessory  muscle usage.  Able speak in full sentences. Abdominal:     General: Bowel sounds are normal. There is no distension.     Palpations: There is no mass.     Tenderness: There is no abdominal tenderness. There is no right CVA tenderness, left CVA tenderness, guarding or rebound.     Hernia: No hernia is present.     Comments: Soft, nontender without rebound or guarding.  No CVA tenderness. No overlying skin changes to chest wall  Musculoskeletal:        General: No swelling, tenderness, deformity or signs of injury. Normal range of motion.     Cervical back: Full passive range of motion without pain and normal range of motion.     Right lower leg: No edema.     Left lower leg: No edema.     Comments: Moves all 4 extremities without difficulty.  Lower extremities without edema, erythema or warmth. Homans sign negative  Skin:    General: Skin is warm.     Capillary Refill: Capillary refill takes less than 2 seconds.     Comments: Brisk capillary refill.  No rashes or lesions.  Neurological:     General: No focal deficit present.     Mental Status: He is alert and oriented to person, place, and time.     Cranial Nerves: Cranial nerves are intact.     Sensory: Sensation is intact.     Motor: Motor function is intact.     Coordination: Coordination is intact.     Gait: Gait is intact.     Comments: Mental Status:  Alert, oriented, thought content appropriate. Speech fluent without evidence of aphasia. Able to follow 2 step commands without difficulty.  Cranial Nerves:  II:  Peripheral visual fields grossly normal, pupils equal, round, reactive to light III,IV, VI: ptosis not present, extra-ocular motions intact bilaterally  V,VII: smile symmetric, facial light touch sensation equal VIII: hearing grossly normal bilaterally  IX,X: midline uvula rise  XI: bilateral shoulder shrug equal and strong XII: midline tongue extension  Motor:  5/5 in upper and lower extremities bilaterally including  strong and equal grip strength and dorsiflexion/plantar flexion Sensory: Pinprick and light touch normal in all extremities.  Deep Tendon Reflexes: 2+ and symmetric  Cerebellar: normal finger-to-nose with bilateral upper extremities Gait: normal gait and balance CV: distal pulses palpable throughout      ED Results / Procedures /  Treatments   Labs (all labs ordered are listed, but only abnormal results are displayed) Labs Reviewed  CBC WITH DIFFERENTIAL/PLATELET - Abnormal; Notable for the following components:      Result Value   Eosinophils Absolute 1.2 (*)    Basophils Absolute 0.2 (*)    All other components within normal limits  BASIC METABOLIC PANEL - Abnormal; Notable for the following components:   Glucose, Bld 132 (*)    All other components within normal limits  HEPATIC FUNCTION PANEL - Abnormal; Notable for the following components:   ALT 45 (*)    All other components within normal limits  SARS CORONAVIRUS 2 BY RT PCR (HOSPITAL ORDER, PERFORMED IN [facility] LAB)  LIPASE, BLOOD  TROPONIN I (HIGH SENSITIVITY)  TROPONIN I (HIGH SENSITIVITY)    EKG EKG Interpretation  Date/Time:  Wednesday April 20 2020 13:43:07 EDT Ventricular Rate:  93 PR Interval:  106 QRS Duration: 102 QT Interval:  370 QTC Calculation: 460 R Axis:   11 Text Interpretation: Sinus rhythm with short PR Left ventricular hypertrophy with repolarization abnormality ( R in aVL , Sokolow-Lyon , Cornell product , Romhilt-Estes ) No significant change since last tracing Confirmed by Cathren Laine ([id number]) on 04/20/2020 1:52:45 PM   Radiology CT Head Wo Contrast  Result Date: 04/20/2020 CLINICAL DATA:  Headache EXAM: CT HEAD WITHOUT CONTRAST TECHNIQUE: Contiguous axial images were obtained from the base of the skull through the vertex without intravenous contrast. COMPARISON:  [id number] FINDINGS: Brain: His No acute intracranial abnormality. Specifically, no hemorrhage, hydrocephalus, mass  lesion, acute infarction, or significant intracranial injury. Vascular: No hyperdense vessel or unexpected calcification. Skull: No acute calvarial abnormality. Sinuses/Orbits: Near complete opacification of the paranasal sinuses. Mastoid air cells clear. Other: None IMPRESSION: No intracranial abnormality. Extensive sinusitis with near complete opacification of the paranasal sinuses. Electronically Signed   By: Charlett Nose M.D.   On: 04/20/2020 20:21   DG Chest Portable 1 View  Result Date: 04/20/2020 CLINICAL DATA:  Fever for 3 days. EXAM: PORTABLE CHEST 1 VIEW COMPARISON:  Single-view of the chest 03/22/2020. FINDINGS: AICD is unchanged. Lungs are clear. Heart size is normal. No pneumothorax or pleural fluid. IMPRESSION: No acute disease. Electronically Signed   By: Drusilla Kanner M.D.   On: 04/20/2020 14:27   Procedures Procedures (including critical care time)  Medications Ordered in ED Medications  clindamycin (CLEOCIN) capsule 300 mg (has no administration in time range)  ondansetron (ZOFRAN) injection 4 mg (4 mg Intravenous Given 04/20/20 1912)   ED Course  I have reviewed the triage vital signs and the nursing notes.  Pertinent labs & imaging results that were available during my care of the patient were reviewed by me and considered in my medical decision making (see chart for details).  44 year old presents for evaluation of multiple complaints.  He is afebrile, nonseptic appearing.  Patient with ongoing congestion, rhinorrhea, cough as well as myalgias over the last 3 weeks.  He has been seen in the emergency department twice for similar complaints.  Recently started on Augmentin which he has been taking for 4 days.  He is not taking any additional complaints.  He has chronic chest pain which has not changed.  Has defibrillator in place and this has not fired.  His abdomen is soft, nontender.  No melena or bright blood per rectum in stools.  He does have a frontal headache as well as  some pain around his prior site of his prior subdural hemorrhage.  He denies any recent head trauma or injuries.  No sudden onset thunderclap headache.  He has no neck stiffness or neck rigidity.  He has no meningismus.  Patient with nonfocal neuro exam without deficits.    Labs and imaging personally viewed interpreted which began from triage:  DG chest without acute findings specifically no infiltrates, cardiomegaly, pleural edema, pneumothorax EKG without significant change since last EKG CBC without leukocytosis, hemoglobin 16.1 Basic metabolic panel mild hyperglycemia to 132 Hepatic function panel with ALT at 45 over no additional electrolyte, renal or normality Lipase 33 Troponin 3>>4, flat COVID negative CT head with sinusitis  Patient reassessed. Tolerating p.o. intake without difficulty.  Given patient for sinusitis and no improvement plan to change abx and add steroids.  Discussed with Dr. Pollyann Kennedy with ENT. Recommends starting clindamycin +/- steroid and follow up in office. Discussed this plan with patient. He is agreeable to this. He is tolerating p.o. intake without difficulty. Patient continues have nonfocal neuro exam without deficits. Abdomen soft, nontender.  Patient does not meet the SIRS or Sepsis criteria.  On repeat exam patient does not have a surgical abdomin and there are no peritoneal signs.  No indication of appendicitis, bowel obstruction, bowel perforation, cholecystitis, diverticulitis.  The patient has been appropriately medically screened and/or stabilized in the ED. I have low suspicion for any other emergent medical condition which would require further screening, evaluation or treatment in the ED or require inpatient management.  Patient is hemodynamically stable and in no acute distress.  Patient able to ambulate in department prior to ED.  Evaluation does not show acute pathology that would require ongoing or additional emergent interventions while in the  emergency department or further inpatient treatment.  I have discussed the diagnosis with the patient and answered all questions.  Pain is been managed while in the emergency department and patient has no further complaints prior to discharge.  Patient is comfortable with plan discussed in room and is stable for discharge at this time.  I have discussed strict return precautions for returning to the emergency department.  Patient was encouraged to follow-up with PCP/specialist refer to at discharge.    MDM Rules/Calculators/A&P                          Kameryn Carmona was evaluated in Emergency Department on 04/20/2020 for the symptoms described in the history of present illness. He was evaluated in the context of the global COVID-19 pandemic, which necessitated consideration that the patient might be at risk for infection with the SARS-CoV-2 virus that causes COVID-19. Institutional protocols and algorithms that pertain to the evaluation of patients at risk for COVID-19 are in a state of rapid change based on information released by regulatory bodies including the CDC and federal and state organizations. These policies and algorithms were followed during the patient's care in the ED. Final Clinical Impression(s) / ED Diagnoses Final diagnoses:  Acute recurrent maxillary sinusitis    Rx / DC Orders ED Discharge Orders         Ordered    clindamycin (CLEOCIN) 300 MG capsule  3 times daily        04/20/20 2035    predniSONE (DELTASONE) 20 MG tablet  Daily        04/20/20 2035           Lavonna Lampron A, PA-C 04/20/20 2043    Sabas Sous, MD 04/20/20 2139

## 2020-04-20 NOTE — ED Notes (Signed)
ED Provider at bedside. 

## 2020-04-20 NOTE — ED Notes (Signed)
Pt has not had any vomiting since this morning. Pt has head congestion with productive cough

## 2020-04-20 NOTE — ED Triage Notes (Signed)
Pt with fever for past 3 days and emesis for a total of 4 times.  Recent sick contact, unknown of covid results.  Pt denies being tested since last Thursday (which was negative).  Pt with recent sinus infection.

## 2020-04-20 NOTE — Discharge Instructions (Signed)
Stop taking the Augmentin  Start the Clindamycin and the steroids.  Follow-up with the ear nose and throat doctors if you have continued symptoms beyond 1 week

## 2020-05-09 ENCOUNTER — Ambulatory Visit (HOSPITAL_COMMUNITY)
Admission: RE | Admit: 2020-05-09 | Discharge: 2020-05-09 | Disposition: A | Discharge status: Home / Self Care | Payer: Medicaid Other | Source: Ambulatory Visit | Attending: Cardiology | Admitting: Cardiology

## 2020-05-09 ENCOUNTER — Other Ambulatory Visit: Payer: Self-pay

## 2020-05-09 VITALS — BP 148/90 | HR 81 | Wt 204.6 lb

## 2020-05-09 DIAGNOSIS — I5022 Chronic systolic (congestive) heart failure: Secondary | ICD-10-CM | POA: Diagnosis present

## 2020-05-09 DIAGNOSIS — Z87891 Personal history of nicotine dependence: Secondary | ICD-10-CM | POA: Insufficient documentation

## 2020-05-09 DIAGNOSIS — I428 Other cardiomyopathies: Secondary | ICD-10-CM | POA: Insufficient documentation

## 2020-05-09 DIAGNOSIS — Z7984 Long term (current) use of oral hypoglycemic drugs: Secondary | ICD-10-CM | POA: Insufficient documentation

## 2020-05-09 DIAGNOSIS — J45909 Unspecified asthma, uncomplicated: Secondary | ICD-10-CM | POA: Insufficient documentation

## 2020-05-09 DIAGNOSIS — Z7901 Long term (current) use of anticoagulants: Secondary | ICD-10-CM | POA: Diagnosis not present

## 2020-05-09 DIAGNOSIS — Z95810 Presence of automatic (implantable) cardiac defibrillator: Secondary | ICD-10-CM | POA: Insufficient documentation

## 2020-05-09 DIAGNOSIS — Z79899 Other long term (current) drug therapy: Secondary | ICD-10-CM | POA: Diagnosis not present

## 2020-05-09 DIAGNOSIS — Z8249 Family history of ischemic heart disease and other diseases of the circulatory system: Secondary | ICD-10-CM | POA: Insufficient documentation

## 2020-05-09 LAB — DIGOXIN LEVEL: Digoxin Level: 0.4 ng/mL — ABNORMAL LOW (ref 1.0–2.0)

## 2020-05-09 LAB — BASIC METABOLIC PANEL
Anion gap: 10 (ref 5–15)
BUN: 20 mg/dL (ref 6–20)
CO2: 23 mmol/L (ref 22–32)
Calcium: 9.4 mg/dL (ref 8.9–10.3)
Chloride: 106 mmol/L (ref 98–111)
Creatinine, Ser: 1.13 mg/dL (ref 0.61–1.24)
GFR calc Af Amer: >60 mL/min (ref >60)
GFR calc non Af Amer: >60 mL/min (ref >60)
Glucose, Bld: 105 mg/dL — ABNORMAL HIGH (ref 70–99)
Potassium: 4.5 mmol/L (ref 3.5–5.1)
Sodium: 139 mmol/L (ref 135–145)

## 2020-05-09 MED ORDER — EPLERENONE 50 MG PO TABS: 50.0000 mg | ORAL_TABLET | Freq: Every day | ORAL | 4 refills | Status: DC

## 2020-05-09 MED ORDER — CARVEDILOL 6.25 MG PO TABS
9.3750 mg | ORAL_TABLET | Freq: Two times a day (BID) | ORAL | 4 refills | Status: DC
Start: 2020-05-09 — End: 2020-10-14

## 2020-05-09 NOTE — Progress Notes (Signed)
PCP: [facility] Primary Cardiologist: Dr Shirlee Latch   HPI: [name] is a 44 y.o. with history of anxiety, asthma, and depression. Previously a heavy drinker and smoker.  No longer has driver's license due to multiple DWIs.   Admitted 10/08/19 after syncopal episode and dyspnea with exertion. Had small SAH and rib fracture. Had echo that showed severely reduced EF, new onset acute systolic heart failure. Cardiac cath was negative for coronary disease, and showed low filling pressures and adequate cardiac output. Discharged with Life Vest given concern that syncope was arrhythmic. Started low dose HF meds.   Was evaluated at [facility] ED in 3/21 due to dyspnea and rib pain. New rib fracture was noted on the left. SAH had resolved. CXR and CTA negative. He was discharged to home.   Echo in 6/21 showed that EF remained 30%. Now with [employer] ICD in 8/21.   He returns today for followup of CHF.  He is not drinking or smoking.  Feels the best that he's felt in a long time.  No activity limitations, no significant exertional dyspnea or chest pain.  No lightheadedness.  No orthopnea/PND.    [employer] device interrogation: Heartlogic score 0.   Labs (6/21): K 4.7, creatinine 1.15  PMH: 1. Anxiety 2. Asthma 3. H/o ETOH abuse.  4. Chronic systolic CHF: Echo (2/21) with EF < 20%, normal RV.  Nonischemic cardiomyopathy.  - RHC/LHC (3/21): No significant CAD; mean RA 3, PA 24/5, mean PCWP 3, CI 2.3.  - Cardiac MRI (3/21): EF 25%, normal RV, no LGE.  - Echo (6/21): EF 30%, diffuse hypokinesis, normal RV, normal IVC.  [employer] ICD.  5. Syncope: Has ICD 6. Traumatic SAH in 2/21.   SH: Does not drive. No longer smokes. Stopped drinking in 2/21, prior heavy use. Lives with son, mother in law, and father in Social worker. Difficulty paying for medications. Work on cars full time.   FH: Mom and 2 sister have heart disease. He has not idea what kind of heart problems. Mother  deceased ? Cancer.   ROS: All systems negative except as listed in HPI, PMH and Problem List.   Current Outpatient Medications  Medication Sig Dispense Refill  . carvedilol (COREG) 6.25 MG tablet Take 1.5 tablets (9.375 mg total) by mouth 2 (two) times daily with a meal. 90 tablet 4  . dapagliflozin propanediol (FARXIGA) 10 MG TABS tablet Take 1 tablet (10 mg total) by mouth daily before breakfast. 30 tablet 11  . digoxin (LANOXIN) 0.125 MG tablet Take 1 tablet (0.125 mg total) by mouth daily. 90 tablet 3  . eplerenone (INSPRA) 50 MG tablet Take 1 tablet (50 mg total) by mouth daily. 30 tablet 4  . meclizine (ANTIVERT) 25 MG tablet Take 0.5 tablets (12.5 mg total) by mouth 3 (three) times daily as needed for dizziness. 30 tablet 6  . sacubitril-valsartan (ENTRESTO) 97-103 MG Take 1 tablet by mouth 2 (two) times daily. 60 tablet 5  . amoxicillin-clavulanate (AUGMENTIN) 875-125 MG tablet Take 1 tablet by mouth every 12 (twelve) hours. (Patient not taking: Reported on 05/09/2020) 14 tablet 0   No current facility-administered medications for this encounter.   Wt Readings from Last 3 Encounters:  05/09/20 92.8 kg (204 lb 9.6 oz)  04/20/20 93 kg (205 lb)  04/14/20 93 kg (205 lb)    Vitals:   05/09/20 1117  BP: (!) 148/90  Pulse: 81  SpO2: 97%  Weight: 92.8 kg (204 lb 9.6 oz)  General: NAD Neck: No JVD, no thyromegaly or thyroid nodule.  Lungs: Clear to auscultation bilaterally with normal respiratory effort. CV: Nondisplaced PMI.  Heart regular S1/S2, no S3/S4, no murmur.  No peripheral edema.  No carotid bruit.  Normal pedal pulses.  Abdomen: Soft, nontender, no hepatosplenomegaly, no distention.  Skin: Intact without lesions or rashes.  Neurologic: Alert and oriented x 3.  Psych: Normal affect. Extremities: No clubbing or cyanosis.  HEENT: Normal.   Assessment/Plan: 1. Chronic Systolic HF:  Nonischemic cardiomyopathy. Echo in 2/21 showedLV dilation with EF <20%, the RV  appeared normal. Given the LV dilation, I suspected that the cardiomyopathy was not new (probably less likely to be a stress cardiomyopathy related to the subarachnoid hemorrhage). 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 He is no longer drinking. Cannot rule out viral cardiomyopathy. He has multiple family members with "heart disease" but he does not have details. Coronary angiography did not show any significant CAD. Cardiac MRI showed no infiltrative disease, no prior MI, EF 25%, RV normal.  Echo in 6/21 showed EF 30% with diffuse hypokinesis, mild LV dilation. He had a [employer] ICD placed.  NYHA class I symptoms, he is not volume overloaded on exam or by Heartlogic.   - Continue Entresto 97/103 bid.  - Continue digoxin 0.125 mg daily. Check level today  - Increase eplerenone to 50 mg daily.  BMET today and again in 10 days.  - Increase Coreg to 9.375 mg bid.  - Continue Farxiga 10 mg daily.  2. Syncope: Initial presentation with syncope, no prodrome.  Given low EF, ?arrhythmic event.  He now has ICD.  3. SAH: Small volume SAH post-syncope in 2/21.  - Repeat CT of head 10/21/19 showed SAH has resolved.  4. ETOH abuse: Reports complete cessation.    Followup with HF pharmacist in 3 wks for med titration (Coreg).  See me again in 3 months.   Marca Ancona 05/09/2020

## 2020-05-09 NOTE — Patient Instructions (Signed)
INCREASE Eplerone 50mg  Daily  INCREASE Carvedilol 9.375mg  Twice Daily  Labs done today, your results will be available in MyChart, we will contact you for abnormal readings.  Please repeat labs in 10-14 days. A prescription has been sent with you to have it done locally.  Please follow up with our pharmacist in 1 month  Please follow up with our physician in 3 months for a follow up appointment  If you have any questions or concerns before your next appointment please send [phone] a message through [place] or call our office at [phone].    TO LEAVE A MESSAGE FOR THE NURSE SELECT OPTION 2, PLEASE LEAVE A MESSAGE INCLUDING: . YOUR NAME . DATE OF BIRTH . CALL BACK NUMBER . REASON FOR CALL**this is important as we prioritize the call backs  YOU WILL RECEIVE A CALL BACK THE SAME DAY AS LONG AS YOU CALL BEFORE 4:00 PM  At the [facility], you and your health needs are our priority. As part of our continuing mission to provide you with exceptional heart care, we have created designated Provider Care Teams. These Care Teams include your primary Cardiologist (physician) and Advanced Practice Providers (APPs- Physician Assistants and Nurse Practitioners) who all work together to provide you with the care you need, when you need it.   You may see any of the following providers on your designated Care Team at your next follow up: [phone] Dr Marland Kitchen . Dr Arvilla Meres . Marca Ancona, NP . Tonye Becket, PA . Robbie Lis, PharmD   Please be sure to bring in all your medications bottles to every appointment.

## 2020-06-08 ENCOUNTER — Inpatient Hospital Stay (HOSPITAL_COMMUNITY): Admission: RE | Admit: 2020-06-08 | Payer: Medicaid Other | Source: Ambulatory Visit

## 2020-06-21 ENCOUNTER — Ambulatory Visit (INDEPENDENT_AMBULATORY_CARE_PROVIDER_SITE_OTHER): Payer: Medicaid Other

## 2020-06-21 DIAGNOSIS — I5022 Chronic systolic (congestive) heart failure: Secondary | ICD-10-CM

## 2020-06-21 LAB — CUP PACEART REMOTE DEVICE CHECK
Battery Remaining Longevity: 180 mo
Battery Remaining Percentage: 100 %
Brady Statistic RV Percent Paced: 0 %
Date Time Interrogation Session: 20211109003000
HighPow Impedance: 88 Ohm
Implantable Lead Implant Date: 20210810
Implantable Lead Location: 753860
Implantable Lead Model: 273
Implantable Lead Serial Number: 111079
Implantable Pulse Generator Implant Date: 20210810
Lead Channel Impedance Value: 637 Ohm
Lead Channel Setting Pacing Amplitude: 3.5 V
Lead Channel Setting Pacing Pulse Width: 0.4 ms
Lead Channel Setting Sensing Sensitivity: 0.5 mV
Pulse Gen Serial Number: 278671

## 2020-06-23 NOTE — Progress Notes (Signed)
Remote ICD transmission.   

## 2020-07-05 ENCOUNTER — Other Ambulatory Visit: Payer: Self-pay

## 2020-07-05 ENCOUNTER — Encounter: Payer: Self-pay | Admitting: Internal Medicine

## 2020-07-05 ENCOUNTER — Ambulatory Visit (INDEPENDENT_AMBULATORY_CARE_PROVIDER_SITE_OTHER): Payer: Medicaid Other | Admitting: Internal Medicine

## 2020-07-05 VITALS — BP 128/90 | HR 70 | Ht 72.0 in | Wt 206.8 lb

## 2020-07-05 DIAGNOSIS — I5022 Chronic systolic (congestive) heart failure: Secondary | ICD-10-CM

## 2020-07-05 DIAGNOSIS — Z95810 Presence of automatic (implantable) cardiac defibrillator: Secondary | ICD-10-CM

## 2020-07-05 NOTE — Progress Notes (Signed)
HPI Mr. Bloom returns today for followup after undergoing ICD insertion. He is a pleasant 44 yo man with a h/o chronic systolic heart failure, EF 30%, and remote ETOH abuse. He has felt well since his ICD was inserted and denies any ICD therapies. He is back to work as a Curator.  Allergies  Allergen Reactions  . Spironolactone     Gynecomastia/breast tenderness     Current Outpatient Medications  Medication Sig Dispense Refill  . amoxicillin-clavulanate (AUGMENTIN) 875-125 MG tablet Take 1 tablet by mouth every 12 (twelve) hours. 14 tablet 0  . carvedilol (COREG) 6.25 MG tablet Take 1.5 tablets (9.375 mg total) by mouth 2 (two) times daily with a meal. 90 tablet 4  . dapagliflozin propanediol (FARXIGA) 10 MG TABS tablet Take 1 tablet (10 mg total) by mouth daily before breakfast. 30 tablet 11  . digoxin (LANOXIN) 0.125 MG tablet Take 1 tablet (0.125 mg total) by mouth daily. 90 tablet 3  . meclizine (ANTIVERT) 25 MG tablet Take 0.5 tablets (12.5 mg total) by mouth 3 (three) times daily as needed for dizziness. 30 tablet 6  . sacubitril-valsartan (ENTRESTO) 97-103 MG Take 1 tablet by mouth 2 (two) times daily. 60 tablet 5   No current facility-administered medications for this visit.     Past Medical History:  Diagnosis Date  . Anxiety   . Asthma   . Depression   . Nerve damage     ROS:   All systems reviewed and negative except as noted in the HPI.   Past Surgical History:  Procedure Laterality Date  . APPENDECTOMY    . ICD IMPLANT N/A 03/22/2020   Procedure: ICD IMPLANT;  Surgeon: Marinus Maw, MD;  Location: [facility] INVASIVE CV LAB;  Service: Cardiovascular;  Laterality: N/A;  . RIGHT/LEFT HEART CATH AND CORONARY ANGIOGRAPHY N/A 10/12/2019   Procedure: RIGHT/LEFT HEART CATH AND CORONARY ANGIOGRAPHY;  Surgeon: Laurey Morale, MD;  Location: [facility] INVASIVE CV LAB;  Service: Cardiovascular;  Laterality: N/A;     History reviewed. No pertinent family  history.   Social History   Socioeconomic History  . Marital status: Divorced    Spouse name: Not on file  . Number of children: 2  . Years of education: Not on file  . Highest education level: Not on file  Occupational History  . Not on file  Tobacco Use  . Smoking status: Former Smoker    Packs/day: 0.50    Types: Cigarettes    Quit date: 10/07/2017    Years since quitting: 2.7  . Smokeless tobacco: Never Used  Vaping Use  . Vaping Use: Never used  Substance and Sexual Activity  . Alcohol use: Yes    Comment: occasionally   . Drug use: No  . Sexual activity: Not on file  Other Topics Concern  . Not on file  Social History Narrative  . Not on file   Social Determinants of Health   Financial Resource Strain: Medium Risk  . Difficulty of Paying Living Expenses: Somewhat hard  Food Insecurity: No Food Insecurity  . Worried About Programme researcher, broadcasting/film/video in the Last Year: Never true  . Ran Out of Food in the Last Year: Never true  Transportation Needs: No Transportation Needs  . Lack of Transportation (Medical): No  . Lack of Transportation (Non-Medical): No  Physical Activity:   . Days of Exercise per Week: Not on file  . Minutes of Exercise per Session: Not on file  Stress: Stress Concern Present  . Feeling of Stress : Rather much  Social Connections:   . Frequency of Communication with Friends and Family: Not on file  . Frequency of Social Gatherings with Friends and Family: Not on file  . Attends Religious Services: Not on file  . Active Member of Clubs or Organizations: Not on file  . Attends Banker Meetings: Not on file  . Marital Status: Not on file  Intimate Partner Violence:   . Fear of Current or Ex-Partner: Not on file  . Emotionally Abused: Not on file  . Physically Abused: Not on file  . Sexually Abused: Not on file     BP 128/90   Pulse 70   Ht 6' (1.829 m)   Wt 206 lb 12.8 oz (93.8 kg)   SpO2 98%   BMI 28.05 kg/m   Physical  Exam:  Well appearing NAD HEENT: Unremarkable Neck:  No JVD, no thyromegally Lymphatics:  No adenopathy Back:  No CVA tenderness Lungs:  Clear with no wheezes HEART:  Regular rate rhythm, no murmurs, no rubs, no clicks Abd:  soft, positive bowel sounds, no organomegally, no rebound, no guarding Ext:  2 plus pulses, no edema, no cyanosis, no clubbing Skin:  No rashes no nodules Neuro:  CN II through XII intact, motor grossly intact  EKG - nsr with NSSTT changes  DEVICE  Normal device function.  See PaceArt for details.    Assess/Plan: 1. Chronic systolic heart failure - his symptoms are class 2 on maximal medical therapy. He will continue current GDMT. 2. ICD - his Anthem Sci single chamber ICD is working normally. No change in meds. 3. Remote ETOH - he is abstaining.   Sharlot Gowda Avianna Moynahan,MD

## 2020-07-05 NOTE — Patient Instructions (Signed)

## 2020-07-13 ENCOUNTER — Emergency Department (HOSPITAL_COMMUNITY)
Admission: EM | Admit: 2020-07-13 | Discharge: 2020-07-13 | Disposition: A | Discharge status: Home / Self Care | Payer: Medicaid Other | Attending: Emergency Medicine | Admitting: Emergency Medicine

## 2020-07-13 ENCOUNTER — Other Ambulatory Visit: Payer: Self-pay

## 2020-07-13 ENCOUNTER — Emergency Department (HOSPITAL_COMMUNITY): Payer: Medicaid Other

## 2020-07-13 ENCOUNTER — Encounter (HOSPITAL_COMMUNITY): Payer: Self-pay | Admitting: Emergency Medicine

## 2020-07-13 DIAGNOSIS — R079 Chest pain, unspecified: Secondary | ICD-10-CM | POA: Diagnosis present

## 2020-07-13 DIAGNOSIS — J45909 Unspecified asthma, uncomplicated: Secondary | ICD-10-CM | POA: Insufficient documentation

## 2020-07-13 DIAGNOSIS — Z87891 Personal history of nicotine dependence: Secondary | ICD-10-CM | POA: Diagnosis not present

## 2020-07-13 DIAGNOSIS — Z95810 Presence of automatic (implantable) cardiac defibrillator: Secondary | ICD-10-CM | POA: Insufficient documentation

## 2020-07-13 DIAGNOSIS — I509 Heart failure, unspecified: Secondary | ICD-10-CM | POA: Insufficient documentation

## 2020-07-13 LAB — BASIC METABOLIC PANEL
Anion gap: 8 (ref 5–15)
BUN: 22 mg/dL — ABNORMAL HIGH (ref 6–20)
CO2: 26 mmol/L (ref 22–32)
Calcium: 9 mg/dL (ref 8.9–10.3)
Chloride: 106 mmol/L (ref 98–111)
Creatinine, Ser: 1.24 mg/dL (ref 0.61–1.24)
GFR, Estimated: >60 mL/min (ref >60)
Glucose, Bld: 95 mg/dL (ref 70–99)
Potassium: 4.6 mmol/L (ref 3.5–5.1)
Sodium: 140 mmol/L (ref 135–145)

## 2020-07-13 LAB — CBC
HCT: 46.7 % (ref 39.0–52.0)
Hemoglobin: 15.4 g/dL (ref 13.0–17.0)
MCH: 31.9 pg (ref 26.0–34.0)
MCHC: 33 g/dL (ref 30.0–36.0)
MCV: 96.7 fL (ref 80.0–100.0)
Platelets: 326 10*3/uL (ref 150–400)
RBC: 4.83 MIL/uL (ref 4.22–5.81)
RDW: 12.4 % (ref 11.5–15.5)
WBC: 8.5 10*3/uL (ref 4.0–10.5)
nRBC: 0 % (ref 0.0–0.2)

## 2020-07-13 LAB — TROPONIN I (HIGH SENSITIVITY): Troponin I (High Sensitivity): 6 ng/L (ref <18)

## 2020-07-13 MED ORDER — ACETAMINOPHEN 500 MG PO TABS
500.0000 mg | ORAL_TABLET | Freq: Once | ORAL | Status: AC
  Administered 2020-07-13: 500 mg via ORAL
  Filled 2020-07-13: qty 1

## 2020-07-13 MED ORDER — OXYCODONE HCL 5 MG PO TABS
5.0000 mg | ORAL_TABLET | Freq: Once | ORAL | Status: AC
  Administered 2020-07-13: 5 mg via ORAL
  Filled 2020-07-13: qty 1

## 2020-07-13 MED ORDER — METHOCARBAMOL 500 MG PO TABS: 500.0000 mg | ORAL_TABLET | Freq: Three times a day (TID) | ORAL | 0 refills | Status: DC | PRN

## 2020-07-13 MED ORDER — ACETAMINOPHEN 500 MG PO TABS
1000.0000 mg | ORAL_TABLET | Freq: Once | ORAL | Status: AC
  Administered 2020-07-13: 500 mg via ORAL
  Filled 2020-07-13: qty 2

## 2020-07-13 MED ORDER — OXYCODONE HCL 5 MG PO TABS
5.0000 mg | ORAL_TABLET | Freq: Once | ORAL | Status: DC
  Filled 2020-07-13: qty 1

## 2020-07-13 NOTE — ED Provider Notes (Signed)
[facility]-EMERGENCY DEPT [facility] Emergency Department Provider Note MRN:  [id number]  Arrival date & time: 07/13/20     Chief Complaint   Chest Pain   History of Present Illness   Andrew Hinton is a 44 y.o. year-old male with a history of CHF, ICD insertion presenting to the ED with chief complaint of chest pain.  2 days of left-sided chest pain described as a soreness, gradual onset, has been constant.  Worse with palpation or movement of the left arm.  Denies shortness of breath, no leg pain or swelling, no dizziness or diaphoresis, no nausea or vomiting.  Symptoms mild to moderate.  Review of Systems  A complete 10 system review of systems was obtained and all systems are negative except as noted in the HPI and PMH.   Patient's Health History    Past Medical History:  Diagnosis Date  . Anxiety   . Asthma   . Depression   . Nerve damage     Past Surgical History:  Procedure Laterality Date  . APPENDECTOMY    . ICD IMPLANT N/A 03/22/2020   Procedure: ICD IMPLANT;  Surgeon: Marinus Maw, MD;  Location: [facility] INVASIVE CV LAB;  Service: Cardiovascular;  Laterality: N/A;  . RIGHT/LEFT HEART CATH AND CORONARY ANGIOGRAPHY N/A 10/12/2019   Procedure: RIGHT/LEFT HEART CATH AND CORONARY ANGIOGRAPHY;  Surgeon: Laurey Morale, MD;  Location: [facility] INVASIVE CV LAB;  Service: Cardiovascular;  Laterality: N/A;    History reviewed. No pertinent family history.  Social History   Socioeconomic History  . Marital status: Divorced    Spouse name: Not on file  . Number of children: 2  . Years of education: Not on file  . Highest education level: Not on file  Occupational History  . Not on file  Tobacco Use  . Smoking status: Former Smoker    Packs/day: 0.50    Types: Cigarettes    Quit date: 10/07/2017    Years since quitting: 2.7  . Smokeless tobacco: Never Used  Vaping Use  . Vaping Use: Never used  Substance and Sexual Activity  . Alcohol use: Yes    Comment: occasionally    . Drug use: No  . Sexual activity: Not on file  Other Topics Concern  . Not on file  Social History Narrative  . Not on file   Social Determinants of Health   Financial Resource Strain: Medium Risk  . Difficulty of Paying Living Expenses: Somewhat hard  Food Insecurity: No Food Insecurity  . Worried About Programme researcher, broadcasting/film/video in the Last Year: Never true  . Ran Out of Food in the Last Year: Never true  Transportation Needs: No Transportation Needs  . Lack of Transportation (Medical): No  . Lack of Transportation (Non-Medical): No  Physical Activity:   . Days of Exercise per Week: Not on file  . Minutes of Exercise per Session: Not on file  Stress: Stress Concern Present  . Feeling of Stress : Rather much  Social Connections:   . Frequency of Communication with Friends and Family: Not on file  . Frequency of Social Gatherings with Friends and Family: Not on file  . Attends Religious Services: Not on file  . Active Member of Clubs or Organizations: Not on file  . Attends Banker Meetings: Not on file  . Marital Status: Not on file  Intimate Partner Violence:   . Fear of Current or Ex-Partner: Not on file  . Emotionally Abused: Not on file  .  Physically Abused: Not on file  . Sexually Abused: Not on file     Physical Exam   Vitals:   07/13/20 1930 07/13/20 2000  BP: 132/88 (!) 146/83  Pulse: 91 77  Resp: 16 18  Temp:    SpO2: 97% 98%    CONSTITUTIONAL: Well-appearing, NAD NEURO:  Alert and oriented x 3, no focal deficits EYES:  eyes equal and reactive ENT/NECK:  no LAD, no JVD CARDIO: Regular rate, well-perfused, normal S1 and S2, tender to palpation to the left chest, normal appearance of ICD site PULM:  CTAB no wheezing or rhonchi GI/GU:  normal bowel sounds, non-distended, non-tender MSK/SPINE:  No gross deformities, no edema SKIN:  no rash, atraumatic PSYCH:  Appropriate speech and behavior  *Additional and/or pertinent findings included in MDM  below  Diagnostic and Interventional Summary    EKG Interpretation  Date/Time:  Wednesday July 13 2020 17:24:23 EST Ventricular Rate:  94 PR Interval:  116 QRS Duration: 92 QT Interval:  350 QTC Calculation: 437 R Axis:   28 Text Interpretation: Normal sinus rhythm ST & T wave abnormality, consider inferolateral ischemia Abnormal ECG Confirmed by Kennis Carina [phone]) on 07/13/2020 7:43:51 PM      Labs Reviewed  BASIC METABOLIC PANEL - Abnormal; Notable for the following components:      Result Value   BUN 22 (*)    All other components within normal limits  CBC  TROPONIN I (HIGH SENSITIVITY)    DG Chest 2 View  Final Result      Medications  oxyCODONE (Oxy IR/ROXICODONE) immediate release tablet 5 mg (5 mg Oral Not Given 07/13/20 2003)  acetaminophen (TYLENOL) tablet 1,000 mg (500 mg Oral Given 07/13/20 2003)  oxyCODONE (Oxy IR/ROXICODONE) immediate release tablet 5 mg (5 mg Oral Given 07/13/20 2018)  acetaminophen (TYLENOL) tablet 500 mg (500 mg Oral Given 07/13/20 2018)     Procedures  /  Critical Care Procedures  ED Course and Medical Decision Making  I have reviewed the triage vital signs, the nursing notes, and pertinent available records from the EMR.  Listed above are laboratory and imaging tests that I personally ordered, reviewed, and interpreted and then considered in my medical decision making (see below for details).  Exam consistent with chest wall related pain, however given his cardiac history will be screening with EKG and troponin.  Doubt PE.     Work-up reassuring with negative troponin.  Pain has been constant and present for over 24 hours and so no need for second troponin.  The exam is very consistent with musculoskeletal etiology, appropriate for discharge  Elmer Sow. Pilar Plate, MD [facility] Emergency Medicine [facility] [email or web address]  Final Clinical Impressions(s) / ED Diagnoses     ICD-10-CM   1. Chest pain,  unspecified type  R07.9     ED Discharge Orders         Ordered    methocarbamol (ROBAXIN) 500 MG tablet  Every 8 hours PRN        07/13/20 2121           Discharge Instructions Discussed with and Provided to Patient:     Discharge Instructions     You were evaluated in the Emergency Department and after careful evaluation, we did not find any emergent condition requiring admission or further testing in the hospital.  Your exam/testing today was overall reassuring.  No evidence of heart damage or heart attack.  Still we recommend follow-up with your cardiologist to discuss  your symptoms.  We suspect it is related to muscle strain or soreness.  We recommend Tylenol and/or Motrin.  You can use the muscle relaxer provided for more significant pain  Please return to the Emergency Department if you experience any worsening of your condition.  Thank you for allowing [place] to be a part of your care.        Sabas Sous, MD 07/13/20 2127

## 2020-07-13 NOTE — ED Triage Notes (Signed)
Pt c/o chest pain that began yesterday and continued to get worse today. Pt states his left arm hurts and radiates to his upper back.

## 2020-07-13 NOTE — Discharge Instructions (Addendum)
You were evaluated in the Emergency Department and after careful evaluation, we did not find any emergent condition requiring admission or further testing in the hospital.  Your exam/testing today was overall reassuring.  No evidence of heart damage or heart attack.  Still we recommend follow-up with your cardiologist to discuss your symptoms.  We suspect it is related to muscle strain or soreness.  We recommend Tylenol and/or Motrin.  You can use the muscle relaxer provided for more significant pain  Please return to the Emergency Department if you experience any worsening of your condition.  Thank you for allowing [place] to be a part of your care.

## 2020-08-09 ENCOUNTER — Encounter (HOSPITAL_COMMUNITY): Payer: Medicaid Other | Admitting: Cardiology

## 2020-08-10 ENCOUNTER — Emergency Department (HOSPITAL_COMMUNITY): Payer: Medicaid Other

## 2020-08-10 ENCOUNTER — Encounter (HOSPITAL_COMMUNITY): Payer: Self-pay | Admitting: *Deleted

## 2020-08-10 ENCOUNTER — Other Ambulatory Visit (HOSPITAL_COMMUNITY): Payer: Self-pay | Admitting: Cardiology

## 2020-08-10 ENCOUNTER — Other Ambulatory Visit: Payer: Self-pay

## 2020-08-10 ENCOUNTER — Emergency Department (HOSPITAL_COMMUNITY)
Admission: EM | Admit: 2020-08-10 | Discharge: 2020-08-10 | Disposition: A | Discharge status: Home / Self Care | Payer: Medicaid Other | Attending: Emergency Medicine | Admitting: Emergency Medicine

## 2020-08-10 DIAGNOSIS — Z95810 Presence of automatic (implantable) cardiac defibrillator: Secondary | ICD-10-CM | POA: Diagnosis not present

## 2020-08-10 DIAGNOSIS — Z955 Presence of coronary angioplasty implant and graft: Secondary | ICD-10-CM | POA: Insufficient documentation

## 2020-08-10 DIAGNOSIS — I5021 Acute systolic (congestive) heart failure: Secondary | ICD-10-CM | POA: Insufficient documentation

## 2020-08-10 DIAGNOSIS — Z79899 Other long term (current) drug therapy: Secondary | ICD-10-CM | POA: Insufficient documentation

## 2020-08-10 DIAGNOSIS — B349 Viral infection, unspecified: Secondary | ICD-10-CM | POA: Diagnosis not present

## 2020-08-10 DIAGNOSIS — J45909 Unspecified asthma, uncomplicated: Secondary | ICD-10-CM | POA: Diagnosis not present

## 2020-08-10 DIAGNOSIS — Z20822 Contact with and (suspected) exposure to covid-19: Secondary | ICD-10-CM | POA: Insufficient documentation

## 2020-08-10 DIAGNOSIS — Z87891 Personal history of nicotine dependence: Secondary | ICD-10-CM | POA: Diagnosis not present

## 2020-08-10 DIAGNOSIS — R509 Fever, unspecified: Secondary | ICD-10-CM | POA: Diagnosis present

## 2020-08-10 LAB — CBC WITH DIFFERENTIAL/PLATELET
Abs Immature Granulocytes: 0.01 10*3/uL (ref 0.00–0.07)
Basophils Absolute: 0.1 10*3/uL (ref 0.0–0.1)
Basophils Relative: 2 %
Eosinophils Absolute: 0.5 10*3/uL (ref 0.0–0.5)
Eosinophils Relative: 7 %
HCT: 49.9 % (ref 39.0–52.0)
Hemoglobin: 16.5 g/dL (ref 13.0–17.0)
Immature Granulocytes: 0 %
Lymphocytes Relative: 30 %
Lymphs Abs: 1.9 10*3/uL (ref 0.7–4.0)
MCH: 32.1 pg (ref 26.0–34.0)
MCHC: 33.1 g/dL (ref 30.0–36.0)
MCV: 97.1 fL (ref 80.0–100.0)
Monocytes Absolute: 0.5 10*3/uL (ref 0.1–1.0)
Monocytes Relative: 8 %
Neutro Abs: 3.3 10*3/uL (ref 1.7–7.7)
Neutrophils Relative %: 53 %
Platelets: 366 10*3/uL (ref 150–400)
RBC: 5.14 MIL/uL (ref 4.22–5.81)
RDW: 12.2 % (ref 11.5–15.5)
WBC: 6.3 10*3/uL (ref 4.0–10.5)
nRBC: 0 % (ref 0.0–0.2)

## 2020-08-10 LAB — BASIC METABOLIC PANEL
Anion gap: 11 (ref 5–15)
BUN: 14 mg/dL (ref 6–20)
CO2: 26 mmol/L (ref 22–32)
Calcium: 9.4 mg/dL (ref 8.9–10.3)
Chloride: 101 mmol/L (ref 98–111)
Creatinine, Ser: 1.16 mg/dL (ref 0.61–1.24)
GFR, Estimated: >60 mL/min (ref >60)
Glucose, Bld: 98 mg/dL (ref 70–99)
Potassium: 3.9 mmol/L (ref 3.5–5.1)
Sodium: 138 mmol/L (ref 135–145)

## 2020-08-10 LAB — RESP PANEL BY RT-PCR (FLU A&B, COVID) ARPGX2
Influenza A by PCR: NEGATIVE
Influenza B by PCR: NEGATIVE
SARS Coronavirus 2 by RT PCR: NEGATIVE

## 2020-08-10 LAB — POC SARS CORONAVIRUS 2 AG -  ED: SARS Coronavirus 2 Ag: NEGATIVE

## 2020-08-10 MED ORDER — ONDANSETRON 8 MG PO TBDP: 8.0000 mg | ORAL_TABLET | Freq: Three times a day (TID) | ORAL | 0 refills | Status: DC | PRN

## 2020-08-10 NOTE — ED Triage Notes (Signed)
Pt with fever and N/V/D and body aches since Monday.  Denies any known covid exposure.  Denies taking covid vaccines.

## 2020-08-10 NOTE — ED Provider Notes (Signed)
[facility] EMERGENCY DEPARTMENT Provider Note   CSN: [id number] Arrival date & time: 08/10/20  1404     History Chief Complaint  Patient presents with   Fever    Christohper Dube is a 44 y.o. male.  HPI    45 year old male comes in a chief complaint of fever, body aches, cough and nausea with vomiting.  Patient has history of CHF and ICD placement.  Reports that his 84 year old son has been having some cough as well.  Patient is not vaccinated.  Patient symptoms started on Monday, he has not been around anyone else who is sick.  Patient has emesis right after he eats something.  Denies any dizziness.  Past Medical History:  Diagnosis Date   Anxiety    Asthma    Depression    Nerve damage     Patient Active Problem List   Diagnosis Date Noted   ICD (implantable cardioverter-defibrillator) in place 07/05/2020   Chronic systolic heart failure (HCC) 03/16/2020   Syncope 10/09/2019   AKI (acute kidney injury) (HCC) 10/09/2019   Acute systolic CHF (congestive heart failure) (HCC) 10/09/2019   Subarachnoid hemorrhage (HCC) 10/08/2019    Past Surgical History:  Procedure Laterality Date   APPENDECTOMY     ICD IMPLANT N/A 03/22/2020   Procedure: ICD IMPLANT;  Surgeon: Marinus Maw, MD;  Location: [facility] INVASIVE CV LAB;  Service: Cardiovascular;  Laterality: N/A;   RIGHT/LEFT HEART CATH AND CORONARY ANGIOGRAPHY N/A 10/12/2019   Procedure: RIGHT/LEFT HEART CATH AND CORONARY ANGIOGRAPHY;  Surgeon: Laurey Morale, MD;  Location: [facility] INVASIVE CV LAB;  Service: Cardiovascular;  Laterality: N/A;       History reviewed. No pertinent family history.  Social History   Tobacco Use   Smoking status: Former Smoker    Packs/day: 0.50    Types: Cigarettes    Quit date: 10/07/2017    Years since quitting: 2.8   Smokeless tobacco: Never Used  Vaping Use   Vaping Use: Never used  Substance Use Topics   Alcohol use: Yes    Comment: occasionally    Drug use:  No    Home Medications Prior to Admission medications   Medication Sig Start Date End Date Taking? Authorizing Provider  carvedilol (COREG) 6.25 MG tablet Take 1.5 tablets (9.375 mg total) by mouth 2 (two) times daily with a meal. 05/09/20  Yes Laurey Morale, MD  dapagliflozin propanediol (FARXIGA) 10 MG TABS tablet Take 1 tablet (10 mg total) by mouth daily before breakfast. 02/11/20  Yes Laurey Morale, MD  digoxin (LANOXIN) 0.125 MG tablet Take 1 tablet (0.125 mg total) by mouth daily. 02/11/20  Yes Laurey Morale, MD  ENTRESTO 97-103 MG TAKE 1 TABLET BY MOUTH TWICE DAILY 08/10/20  Yes Laurey Morale, MD  meclizine (ANTIVERT) 25 MG tablet Take 0.5 tablets (12.5 mg total) by mouth 3 (three) times daily as needed for dizziness. 11/10/19  Yes Robbie Lis M, PA-C  methocarbamol (ROBAXIN) 500 MG tablet Take 1 tablet (500 mg total) by mouth every 8 (eight) hours as needed for muscle spasms. 07/13/20  Yes Sabas Sous, MD  ondansetron (ZOFRAN ODT) 8 MG disintegrating tablet Take 1 tablet (8 mg total) by mouth every 8 (eight) hours as needed for nausea. 08/10/20  Yes Asani Deniston, MD  Pseudoeph-Doxylamine-DM-APAP (NYQUIL PO) Take 1 tablet by mouth daily.   Yes [provider]  Pseudoephedrine-APAP-DM (DAYQUIL PO) Take 1 tablet by mouth daily.   Yes [provider]  sertraline (  ZOLOFT) 25 MG tablet Take 25 mg by mouth daily. 07/19/20  Yes [provider]  amoxicillin-clavulanate (AUGMENTIN) 875-125 MG tablet Take 1 tablet by mouth every 12 (twelve) hours. Patient not taking: Reported on 08/10/2020 04/14/20   Burgess Amor, PA-C  eplerenone (INSPRA) 25 MG tablet Take 25 mg by mouth daily.  05/12/20   [provider]  ibuprofen (ADVIL) 200 MG tablet Take 200 mg by mouth every 6 (six) hours as needed.    [provider]    Allergies    Spironolactone  Review of Systems   Review of Systems  Constitutional: Positive for activity change.   Respiratory: Positive for cough. Negative for shortness of breath.   Cardiovascular: Negative for chest pain.  Gastrointestinal: Positive for nausea and vomiting.  Allergic/Immunologic: Negative for immunocompromised state.  Neurological: Negative for dizziness and headaches.  All other systems reviewed and are negative.   Physical Exam Updated Vital Signs BP (!) 164/95 (BP Location: Left Arm)    Pulse (!) 57    Temp 98.2 F (36.8 C) (Oral)    Resp 12    Ht 6' (1.829 m)    Wt 92.5 kg    SpO2 100%    BMI 27.67 kg/m   Physical Exam Vitals and nursing note reviewed.  Constitutional:      Appearance: He is well-developed.  HENT:     Head: Atraumatic.  Cardiovascular:     Rate and Rhythm: Normal rate.  Pulmonary:     Effort: Pulmonary effort is normal.  Musculoskeletal:     Cervical back: Neck supple.  Skin:    General: Skin is warm.  Neurological:     Mental Status: He is alert and oriented to person, place, and time.     ED Results / Procedures / Treatments   Labs (all labs ordered are listed, but only abnormal results are displayed) Labs Reviewed  RESP PANEL BY RT-PCR (FLU A&B, COVID) [id number]  CBC WITH DIFFERENTIAL/PLATELET  BASIC METABOLIC PANEL  POC SARS CORONAVIRUS 2 AG -  ED    EKG None  Radiology DG Chest 2 View  Result Date: 08/10/2020 CLINICAL DATA:  Cough and fever. EXAM: CHEST - 2 VIEW COMPARISON:  07/13/2020 FINDINGS: Single lead left-sided pacemaker in place.The cardiomediastinal contours are normal. The lungs are clear. Pulmonary vasculature is normal. No consolidation, pleural effusion, or pneumothorax. No acute osseous abnormalities are seen. IMPRESSION: No acute chest findings. Electronically Signed   By: Narda Rutherford M.D.   On: 08/10/2020 17:49    Procedures Procedures (including critical care time)  Medications Ordered in ED Medications - No data to display  ED Course  I have reviewed the triage vital signs and the nursing  notes.  Pertinent labs & imaging results that were available during my care of the patient were reviewed by me and considered in my medical decision making (see chart for details).    MDM Rules/Calculators/A&P                          Juandavid Dallman was evaluated in Emergency Department on 08/10/2020 for the symptoms described in the history of present illness. He was evaluated in the context of the global COVID-19 pandemic, which necessitated consideration that the patient might be at risk for infection with the SARS-CoV-2 virus that causes COVID-19. Institutional protocols and algorithms that pertain to the evaluation of patients at risk for COVID-19 are in a state of rapid change based on information  released by regulatory bodies including the CDC and federal and state organizations. These policies and algorithms were followed during the patient's care in the ED.   44 year old comes in a chief complaint of cough, body aches, fevers and weakness.  Symptomatic for the last 3 days.  He has a sick contact at home.  Patient is not vaccinated against Covid.  COVID-19 is on the differential along with other viral illnesses including flu..  Reassessment:  Work-up revealed negative COVID-19 test.  Chest x-ray is also reassuring.  We repeated a rapid Covid which is also negative.  Patient is stable for discharge viral infection.   Final Clinical Impression(s) / ED Diagnoses Final diagnoses:  Acute viral syndrome    Rx / DC Orders ED Discharge Orders         Ordered    ondansetron (ZOFRAN ODT) 8 MG disintegrating tablet  Every 8 hours PRN        08/10/20 1843           Derwood Kaplan, MD 08/10/20 1847

## 2020-08-10 NOTE — Discharge Instructions (Addendum)
We suspect you have a viral infection. Covid test and chest xray are negative. Flu test is also negative.  Please take nausea medicine as needed. Hydrate well. Take tylenol or motrin for aches.

## 2020-08-26 ENCOUNTER — Other Ambulatory Visit: Payer: Self-pay

## 2020-08-26 ENCOUNTER — Inpatient Hospital Stay (HOSPITAL_COMMUNITY)
Admission: EM | Admit: 2020-08-26 | Discharge: 2020-08-29 | DRG: 178 | Disposition: A | Discharge status: Home / Self Care | Payer: Medicaid Other | Attending: Internal Medicine | Admitting: Internal Medicine

## 2020-08-26 ENCOUNTER — Emergency Department (HOSPITAL_COMMUNITY): Payer: Medicaid Other

## 2020-08-26 ENCOUNTER — Encounter (HOSPITAL_COMMUNITY): Payer: Self-pay | Admitting: Emergency Medicine

## 2020-08-26 DIAGNOSIS — E861 Hypovolemia: Secondary | ICD-10-CM

## 2020-08-26 DIAGNOSIS — I5022 Chronic systolic (congestive) heart failure: Secondary | ICD-10-CM | POA: Diagnosis present

## 2020-08-26 DIAGNOSIS — A0839 Other viral enteritis: Secondary | ICD-10-CM | POA: Diagnosis present

## 2020-08-26 DIAGNOSIS — U071 COVID-19: Principal | ICD-10-CM | POA: Diagnosis present

## 2020-08-26 DIAGNOSIS — R197 Diarrhea, unspecified: Secondary | ICD-10-CM

## 2020-08-26 DIAGNOSIS — Z95810 Presence of automatic (implantable) cardiac defibrillator: Secondary | ICD-10-CM

## 2020-08-26 DIAGNOSIS — R55 Syncope and collapse: Secondary | ICD-10-CM | POA: Diagnosis present

## 2020-08-26 DIAGNOSIS — N179 Acute kidney failure, unspecified: Secondary | ICD-10-CM | POA: Diagnosis present

## 2020-08-26 DIAGNOSIS — F32A Depression, unspecified: Secondary | ICD-10-CM | POA: Diagnosis present

## 2020-08-26 DIAGNOSIS — K429 Umbilical hernia without obstruction or gangrene: Secondary | ICD-10-CM | POA: Diagnosis present

## 2020-08-26 DIAGNOSIS — I5042 Chronic combined systolic (congestive) and diastolic (congestive) heart failure: Secondary | ICD-10-CM

## 2020-08-26 DIAGNOSIS — E119 Type 2 diabetes mellitus without complications: Secondary | ICD-10-CM | POA: Diagnosis present

## 2020-08-26 DIAGNOSIS — E860 Dehydration: Secondary | ICD-10-CM

## 2020-08-26 DIAGNOSIS — Z79899 Other long term (current) drug therapy: Secondary | ICD-10-CM

## 2020-08-26 DIAGNOSIS — Z87891 Personal history of nicotine dependence: Secondary | ICD-10-CM

## 2020-08-26 DIAGNOSIS — Z7984 Long term (current) use of oral hypoglycemic drugs: Secondary | ICD-10-CM

## 2020-08-26 DIAGNOSIS — K760 Fatty (change of) liver, not elsewhere classified: Secondary | ICD-10-CM

## 2020-08-26 DIAGNOSIS — K4090 Unilateral inguinal hernia, without obstruction or gangrene, not specified as recurrent: Secondary | ICD-10-CM

## 2020-08-26 DIAGNOSIS — I959 Hypotension, unspecified: Secondary | ICD-10-CM | POA: Diagnosis present

## 2020-08-26 LAB — COMPREHENSIVE METABOLIC PANEL
ALT: 40 U/L (ref 0–44)
AST: 44 U/L — ABNORMAL HIGH (ref 15–41)
Albumin: 4.3 g/dL (ref 3.5–5.0)
Alkaline Phosphatase: 52 U/L (ref 38–126)
Anion gap: 11 (ref 5–15)
BUN: 30 mg/dL — ABNORMAL HIGH (ref 6–20)
CO2: 20 mmol/L — ABNORMAL LOW (ref 22–32)
Calcium: 8.7 mg/dL — ABNORMAL LOW (ref 8.9–10.3)
Chloride: 104 mmol/L (ref 98–111)
Creatinine, Ser: 2.97 mg/dL — ABNORMAL HIGH (ref 0.61–1.24)
GFR, Estimated: 26 mL/min — ABNORMAL LOW (ref >60)
Glucose, Bld: 84 mg/dL (ref 70–99)
Potassium: 3.8 mmol/L (ref 3.5–5.1)
Sodium: 135 mmol/L (ref 135–145)
Total Bilirubin: 0.6 mg/dL (ref 0.3–1.2)
Total Protein: 7.8 g/dL (ref 6.5–8.1)

## 2020-08-26 LAB — CBC WITH DIFFERENTIAL/PLATELET
Abs Immature Granulocytes: 0.01 10*3/uL (ref 0.00–0.07)
Basophils Absolute: 0 10*3/uL (ref 0.0–0.1)
Basophils Relative: 0 %
Eosinophils Absolute: 0.2 10*3/uL (ref 0.0–0.5)
Eosinophils Relative: 3 %
HCT: 44.7 % (ref 39.0–52.0)
Hemoglobin: 15.4 g/dL (ref 13.0–17.0)
Immature Granulocytes: 0 %
Lymphocytes Relative: 20 %
Lymphs Abs: 1 10*3/uL (ref 0.7–4.0)
MCH: 32.8 pg (ref 26.0–34.0)
MCHC: 34.5 g/dL (ref 30.0–36.0)
MCV: 95.3 fL (ref 80.0–100.0)
Monocytes Absolute: 0.6 10*3/uL (ref 0.1–1.0)
Monocytes Relative: 12 %
Neutro Abs: 3.3 10*3/uL (ref 1.7–7.7)
Neutrophils Relative %: 65 %
Platelets: 170 10*3/uL (ref 150–400)
RBC: 4.69 MIL/uL (ref 4.22–5.81)
RDW: 12.4 % (ref 11.5–15.5)
WBC: 5.1 10*3/uL (ref 4.0–10.5)
nRBC: 0 % (ref 0.0–0.2)

## 2020-08-26 LAB — RESP PANEL BY RT-PCR (FLU A&B, COVID) ARPGX2
Influenza A by PCR: NEGATIVE
Influenza B by PCR: NEGATIVE
SARS Coronavirus 2 by RT PCR: POSITIVE — AB

## 2020-08-26 LAB — LACTIC ACID, PLASMA: Lactic Acid, Venous: 1.7 mmol/L (ref 0.5–1.9)

## 2020-08-26 LAB — LIPASE, BLOOD: Lipase: 46 U/L (ref 11–51)

## 2020-08-26 LAB — TROPONIN I (HIGH SENSITIVITY): Troponin I (High Sensitivity): 9 ng/L (ref <18)

## 2020-08-26 LAB — DIGOXIN LEVEL: Digoxin Level: 0.7 ng/mL — ABNORMAL LOW (ref 0.8–2.0)

## 2020-08-26 LAB — BRAIN NATRIURETIC PEPTIDE: B Natriuretic Peptide: 23 pg/mL (ref 0.0–100.0)

## 2020-08-26 MED ORDER — SODIUM CHLORIDE 0.9 % IV BOLUS
500.0000 mL | Freq: Once | INTRAVENOUS | Status: AC
  Administered 2020-08-26: 500 mL via INTRAVENOUS

## 2020-08-26 MED ORDER — SODIUM CHLORIDE 0.9 % IV BOLUS
500.0000 mL | Freq: Once | INTRAVENOUS | Status: AC
  Administered 2020-08-27: 500 mL via INTRAVENOUS

## 2020-08-26 NOTE — ED Notes (Signed)
Date and time results received: 08/26/20 11:15 PM(use smartphrase ".now" to insert current time)  Test: covid Critical Value: positive  Name of Provider Notified: Dr Pilar Plate  Orders Received? Or Actions Taken?:see chart

## 2020-08-26 NOTE — ED Provider Notes (Addendum)
Emergency Department Provider Note   I have reviewed the triage vital signs and the nursing notes.   HISTORY  Chief Complaint Abdominal Pain   HPI Andrew Hinton is a 45 y.o. male with past medical history of systolic CHF (EF 16%30%) followed by cardiology with AICD in place presents to the emergency department with generalized weakness and an episode of syncope today.  Patient states has been feeling fatigued today with few other symptoms.  He denies specifically having chest pain or shortness of breath.  Denies any heart palpitations.  Denies any changes to his medications.  He has been compliant with his medicines and eating/drinking well.  Denies any fevers, chills, body aches.  No sore throat.  He has noticed some pain in his lower abdomen on the left but no diarrhea or vomiting.  He denies developing a lot of fluid in his legs or other parts of his body.  He is near his dry weight according to the patient.    Past Medical History:  Diagnosis Date  . Anxiety   . Asthma   . Depression   . Nerve damage     Patient Active Problem List   Diagnosis Date Noted  . COVID-19 virus infection 08/27/2020  . Acute diarrhea 08/27/2020  . Dehydration 08/27/2020  . Hepatic steatosis 08/27/2020  . Umbilical hernia 08/27/2020  . Left inguinal hernia 08/27/2020  . Gastroenteritis due to COVID-19 virus 08/27/2020  . Chronic combined systolic and diastolic heart failure (HCC) 08/27/2020  . Hypotension 08/27/2020  . ICD (implantable cardioverter-defibrillator) in place 07/05/2020  . Chronic systolic heart failure (HCC) 03/16/2020  . Syncope 10/09/2019  . AKI (acute kidney injury) (HCC) 10/09/2019  . Acute systolic CHF (congestive heart failure) (HCC) 10/09/2019  . Subarachnoid hemorrhage (HCC) 10/08/2019    Past Surgical History:  Procedure Laterality Date  . APPENDECTOMY    . ICD IMPLANT N/A 03/22/2020   Procedure: ICD IMPLANT;  Surgeon: Marinus Mawaylor, Gregg W, MD;  [facility];  Service: Cardiovascular;  Laterality: N/A;  . RIGHT/LEFT HEART CATH AND CORONARY ANGIOGRAPHY N/A 10/12/2019   Procedure: RIGHT/LEFT HEART CATH AND CORONARY ANGIOGRAPHY;  Surgeon: Laurey MoraleMcLean, Dalton S, MD;  [facility];  Service: Cardiovascular;  Laterality: N/A;    Allergies Spironolactone  History reviewed. No pertinent family history.  Social History Social History   Tobacco Use  . Smoking status: Former Smoker    Packs/day: 0.50    Types: Cigarettes    Quit date: 10/07/2017    Years since quitting: 2.8  . Smokeless tobacco: Never Used  Vaping Use  . Vaping Use: Never used  Substance Use Topics  . Alcohol use: Yes    Comment: occasionally   . Drug use: No    Review of Systems  Constitutional: No fever/chills. Generalized weakness and fatigue.  Eyes: No visual changes. ENT: No sore throat. Cardiovascular: Denies chest pain. Positive syncope.  Respiratory: Denies shortness of breath. Gastrointestinal: No abdominal pain.  No nausea, no vomiting.  No diarrhea.  No constipation. Genitourinary: Negative for dysuria. Musculoskeletal: Negative for back pain. Skin: Negative for rash. Neurological: Negative for headaches, focal weakness or numbness.  10-point ROS otherwise negative.  ____________________________________________   PHYSICAL EXAM:  VITAL SIGNS: Today's Vitals   08/27/20 0900 08/27/20 1000 08/27/20 1235 08/27/20 1300  BP: (!) 86/52 123/72 103/71 106/67  Pulse: (!) 52 76 (!) 48 (!) 59  Resp: 11 17 18 19   Temp:   98 F (36.7 C)  TempSrc:   Oral   SpO2: 98% 100% 99% 99%  Weight:      Height:      PainSc:   8     Body mass index is 27.8 kg/m.   Constitutional: Alert and oriented. Well appearing and in no acute distress. Eyes: Conjunctivae are normal.  Head: Atraumatic. Nose: No congestion/rhinnorhea. Mouth/Throat: Mucous membranes are moist.   Neck: No stridor.   Cardiovascular: Normal rate, regular rhythm. Good peripheral  circulation. Grossly normal heart sounds.   Respiratory: Normal respiratory effort.  No retractions. Lungs CTAB. Gastrointestinal: Soft and nontender. No distention.  Musculoskeletal: No lower extremity tenderness with trace bilateral LE edema. No gross deformities of extremities. Neurologic:  Normal speech and language. No gross focal neurologic deficits are appreciated.  Skin:  Skin is warm, dry and intact. No rash noted.   ____________________________________________   LABS (all labs ordered are listed, but only abnormal results are displayed)  Labs Reviewed  RESP PANEL BY RT-PCR (FLU A&B, COVID) ARPGX2 - Abnormal; Notable for the following components:      Result Value   SARS Coronavirus 2 by RT PCR POSITIVE (*)    All other components within normal limits  COMPREHENSIVE METABOLIC PANEL - Abnormal; Notable for the following components:   CO2 20 (*)    BUN 30 (*)    Creatinine, Ser 2.97 (*)    Calcium 8.7 (*)    AST 44 (*)    GFR, Estimated 26 (*)    All other components within normal limits  DIGOXIN LEVEL - Abnormal; Notable for the following components:   Digoxin Level 0.7 (*)    All other components within normal limits  CREATININE, SERUM - Abnormal; Notable for the following components:   Creatinine, Ser 1.86 (*)    GFR, Estimated 45 (*)    All other components within normal limits  CBG MONITORING, ED - Abnormal; Notable for the following components:   Glucose-Capillary 117 (*)    All other components within normal limits  C DIFFICILE QUICK SCREEN W PCR REFLEX  GASTROINTESTINAL PANEL BY PCR, STOOL (REPLACES STOOL CULTURE)  CULTURE, BLOOD (ROUTINE X 2)  CULTURE, BLOOD (ROUTINE X 2)  LIPASE, BLOOD  CBC WITH DIFFERENTIAL/PLATELET  BRAIN NATRIURETIC PEPTIDE  LACTIC ACID, PLASMA  LACTIC ACID, PLASMA  HEMOGLOBIN A1C  CBC  PROCALCITONIN  CORTISOL-AM, BLOOD  BASIC METABOLIC PANEL  CBG MONITORING, ED  TROPONIN I (HIGH SENSITIVITY)  TROPONIN I (HIGH SENSITIVITY)    ____________________________________________  EKG   EKG Interpretation  Date/Time:  Friday August 26 2020 21:38:39 EST Ventricular Rate:  59 PR Interval:    QRS Duration: 97 QT Interval:  391 QTC Calculation: 388 R Axis:   20 Text Interpretation: Sinus rhythm Borderline repolarization abnormality No significant change since last tracing No STEMI Confirmed by Alona Bene [phone]) on 08/26/2020 10:03:32 PM       ____________________________________________  RADIOLOGY  CT ABDOMEN PELVIS WO CONTRAST  Result Date: 08/27/2020 CLINICAL DATA:  Right lower quadrant pain. EXAM: CT ABDOMEN AND PELVIS WITHOUT CONTRAST TECHNIQUE: Multidetector CT imaging of the abdomen and pelvis was performed following the standard protocol without IV contrast. COMPARISON:  October 08, 2019 FINDINGS: Lower chest: The lung bases are clear. The heart size is normal. Hepatobiliary: There is decreased hepatic attenuation suggestive of hepatic steatosis. Normal gallbladder.There is no biliary ductal dilation. Pancreas: Normal contours without ductal dilatation. No peripancreatic fluid collection. Spleen: Unremarkable. Adrenals/Urinary Tract: --Adrenal glands: Unremarkable. --Right kidney/ureter: No hydronephrosis or radiopaque kidney stones. --Left  kidney/ureter: No hydronephrosis or radiopaque kidney stones. --Urinary bladder: Unremarkable. Stomach/Bowel: --Stomach/Duodenum: No hiatal hernia or other gastric abnormality. Normal duodenal course and caliber. --Small bowel: Unremarkable. --Colon: There is liquid stool throughout the colon. --Appendix: Not visualized. No right lower quadrant inflammation or free fluid. Vascular/Lymphatic: Normal course and caliber of the major abdominal vessels. --No retroperitoneal lymphadenopathy. --No mesenteric lymphadenopathy. --No pelvic or inguinal lymphadenopathy. Reproductive: Unremarkable Other: No ascites or free air. There is a fat containing left inguinal hernia. There is a fat  containing umbilical hernia. Musculoskeletal. No acute displaced fractures. IMPRESSION: 1. Liquid stool throughout the colon consistent with diarrheal illness. 2. Hepatic steatosis. 3. Fat containing left inguinal and umbilical hernias. Electronically Signed   By: Katherine Mantle M.D.   On: 08/27/2020 01:09   DG Chest Portable 1 View  Result Date: 08/26/2020 CLINICAL DATA:  Syncope EXAM: PORTABLE CHEST 1 VIEW COMPARISON:  08/10/2020 FINDINGS: Left-sided pacing device. No focal opacity or pleural effusion. Normal cardiomediastinal silhouette. No pneumothorax. IMPRESSION: No active disease. Electronically Signed   By: Jasmine Pang M.D.   On: 08/26/2020 21:51   ECHOCARDIOGRAM COMPLETE  Result Date: 08/27/2020    ECHOCARDIOGRAM REPORT   Patient Name:   Andrew Hinton Date of Exam: 08/27/2020 Medical Rec #:  [id number]          Height:       72.0 in Accession #:    [id number]         Weight:       205.0 lb Date of Birth:  1976/06/05          BSA:          2.153 m Patient Age:    44 years           BP:           123/72 mmHg Patient Gender: M                  HR:           53 bpm. [facility] Procedure: 2D Echo, Cardiac Doppler and Color Doppler Indications:    R55 Syncope  History:        Patient has prior history of Echocardiogram examinations, most                 recent 02/05/2020. Covid 19 positive.  Sonographer:    Roosvelt Maser Referring Phys: [id number] OLADAPO ADEFESO IMPRESSIONS  1. Left ventricular ejection fraction, by estimation, is 25 to 30%. The left ventricle has severely decreased function. The left ventricle demonstrates global hypokinesis. Left ventricular diastolic parameters are indeterminate.  2. Right ventricular systolic function is normal. The right ventricular size is normal. There is normal pulmonary artery systolic pressure.  3. The mitral valve is normal in structure. Trivial mitral valve regurgitation. No evidence of mitral stenosis.  4. The aortic valve is tricuspid.  Aortic valve regurgitation is not visualized. No aortic stenosis is present.  5. The inferior vena cava is normal in size with greater than 50% respiratory variability, suggesting right atrial pressure of 3 mmHg. FINDINGS  Left Ventricle: Left ventricular ejection fraction, by estimation, is 25 to 30%. The left ventricle has severely decreased function. The left ventricle demonstrates global hypokinesis. The left ventricular internal cavity size was normal in size. There is no left ventricular hypertrophy. Left ventricular diastolic parameters are indeterminate. Right Ventricle: The right ventricular size is normal.Right ventricular systolic function is normal. There is normal pulmonary artery systolic pressure. The  tricuspid regurgitant velocity is 1.89 m/s, and with an assumed right atrial pressure of 3 mmHg, the estimated right ventricular systolic pressure is 17.3 mmHg. Left Atrium: Left atrial size was normal in size. Right Atrium: Right atrial size was normal in size. Pericardium: There is no evidence of pericardial effusion. Mitral Valve: The mitral valve is normal in structure. Trivial mitral valve regurgitation. No evidence of mitral valve stenosis. Tricuspid Valve: The tricuspid valve is normal in structure. Tricuspid valve regurgitation is trivial. No evidence of tricuspid stenosis. Aortic Valve: The aortic valve is tricuspid. Aortic valve regurgitation is not visualized. No aortic stenosis is present. Pulmonic Valve: The pulmonic valve was normal in structure. Pulmonic valve regurgitation is not visualized. No evidence of pulmonic stenosis. Aorta: The aortic root is normal in size and structure. Venous: The inferior vena cava is normal in size with greater than 50% respiratory variability, suggesting right atrial pressure of 3 mmHg.  Additional Comments: A pacer wire is visualized.  LEFT VENTRICLE PLAX 2D LVIDd:         5.23 cm      Diastology LVIDs:         4.55 cm      LV e' medial:    9.36 cm/s LV PW:          0.96 cm      LV E/e' medial:  6.6 LV IVS:        0.89 cm      LV e' lateral:   8.59 cm/s LVOT diam:     2.20 cm      LV E/e' lateral: 7.2 LV SV:         36 LV SV Index:   17 LVOT Area:     3.80 cm  LV Volumes (MOD) LV vol d, MOD A2C: 129.0 ml LV vol d, MOD A4C: 155.0 ml LV vol s, MOD A2C: 77.0 ml LV vol s, MOD A4C: 69.6 ml LV SV MOD A2C:     52.0 ml LV SV MOD A4C:     155.0 ml LV SV MOD BP:      68.7 ml RIGHT VENTRICLE RV Basal diam:  3.25 cm LEFT ATRIUM             Index       RIGHT ATRIUM           Index LA diam:        3.20 cm 1.49 cm/m  RA Area:     11.60 cm LA Vol (A2C):   45.9 ml 21.32 ml/m RA Volume:   27.10 ml  12.59 ml/m LA Vol (A4C):   44.8 ml 20.81 ml/m LA Biplane Vol: 46.3 ml 21.50 ml/m  AORTIC VALVE LVOT Vmax:   49.00 cm/s LVOT Vmean:  35.500 cm/s LVOT VTI:    0.095 m  AORTA Ao Root diam: 3.70 cm Ao Asc diam:  2.70 cm MITRAL VALVE               TRICUSPID VALVE MV Area (PHT): 2.77 cm    TR Peak grad:   14.3 mmHg MV Decel Time: 274 msec    TR Vmax:        189.00 cm/s MV E velocity: 61.70 cm/s MV A velocity: 48.00 cm/s  SHUNTS MV E/A ratio:  1.29        Systemic VTI:  0.09 m  Systemic Diam: 2.20 cm Olga Millers MD Electronically signed by Olga Millers MD Signature Date/Time: 08/27/2020/12:05:19 PM    Final     ____________________________________________   PROCEDURES  Procedure(s) performed:   .Critical Care Performed by: Maia Plan, MD Authorized by: Maia Plan, MD   Critical care provider statement:    Critical care time (minutes):  45   Critical care time was exclusive of:  Separately billable procedures and treating other patients and teaching time   Critical care was necessary to treat or prevent imminent or life-threatening deterioration of the following conditions:  Shock   Critical care was time spent personally by me on the following activities:  Discussions with consultants, evaluation of patient's response to treatment,  examination of patient, ordering and performing treatments and interventions, ordering and review of laboratory studies, ordering and review of radiographic studies, pulse oximetry, re-evaluation of patient's condition, obtaining history from patient or surrogate, review of old charts, blood draw for specimens and development of treatment plan with patient or surrogate   I assumed direction of critical care for this patient from another provider in my specialty: no       ____________________________________________   INITIAL IMPRESSION / ASSESSMENT AND PLAN / ED COURSE  Pertinent labs & imaging results that were available during my care of the patient were reviewed by me and considered in my medical decision making (see chart for details).   Patient presents emergency department with generalized weakness and syncope.  He is hypertensive on arrival but despite his low blood pressure looks very well.  He is well perfused and does not appear to be in decompensated heart failure.  He does not appear septic to explain his shock.  He is not undergone traumatic injury.  Plan to interrogate his AICD which has been done.  Chest x-ray looks normal on my independent review and confirmed by radiology.  He is not tachycardic.  We will add a digoxin level to his lab work.  Patient does not appear volume overloaded and is near his dry weight.  EKG is similar to his prior values.  Will give gentle IV fluid bolus while additional lab work is pending.   10:57 PM  BP improving after gentle IVF hydration now into the 90s systolic from starting in the 03T. Interrogation results are pending but faxed. Labs pending. CXR is clear.   Care transferred to Dr. Pilar Plate. Patient is COVID positive.  ____________________________________________  FINAL CLINICAL IMPRESSION(S) / ED DIAGNOSES  Final diagnoses:  AKI (acute kidney injury) (HCC)  Hypovolemia  COVID-19     MEDICATIONS GIVEN DURING THIS VISIT:  Medications   enoxaparin (LOVENOX) injection 40 mg (40 mg Subcutaneous Given 08/27/20 0943)  acetaminophen (TYLENOL) tablet 650 mg (650 mg Oral Given 08/27/20 1239)  ascorbic acid (VITAMIN C) tablet 500 mg (500 mg Oral Given 08/27/20 0946)  zinc sulfate capsule 220 mg (220 mg Oral Given 08/27/20 0946)  digoxin (LANOXIN) tablet 0.125 mg (0.125 mg Oral Given 08/27/20 0946)  insulin aspart (novoLOG) injection 0-9 Units (0 Units Subcutaneous Not Given 08/27/20 1227)  insulin aspart (novoLOG) injection 0-5 Units (has no administration in time range)  remdesivir 100 mg in sodium chloride 0.9 % 100 mL IVPB (has no administration in time range)  lactated ringers infusion ( Intravenous Rate/Dose Verify 08/27/20 1110)  sodium chloride 0.9 % bolus 500 mL (0 mLs Intravenous Stopped 08/27/20 0021)  sodium chloride 0.9 % bolus 500 mL (0 mLs Intravenous Stopped 08/27/20 0139)  remdesivir 100 mg  in sodium chloride 0.9 % 100 mL IVPB (0 mg Intravenous Stopped 08/27/20 1106)     Note:  This document was prepared using Dragon voice recognition software and may include unintentional dictation errors.  Alona Bene, MD, FACEP Emergency Medicine    Long, Arlyss Repress, MD 08/27/20 1350    Long, Arlyss Repress, MD 08/27/20 1352

## 2020-08-26 NOTE — ED Provider Notes (Signed)
  Provider Note MRN:  [id number]  Arrival date & time: 08/27/20    ED Course and Medical Decision Making  Assumed care from Dr. Jacqulyn Bath at shift change.  Patient blood pressure responding to fluids.  Labs reveal significant AKI, suspect patient is hypovolemic causing a prerenal insult.  CT imaging without emergent concerns.  Will admit to hospitalist service.  Procedures  Final Clinical Impressions(s) / ED Diagnoses     ICD-10-CM   1. AKI (acute kidney injury) (HCC)  N17.9   2. Hypovolemia  E86.1   3. COVID-19  U07.1     ED Discharge Orders    None      Discharge Instructions   None     Elmer Sow. Pilar Plate, MD [facility] Emergency Medicine [facility] [email or web address]    Sabas Sous, MD 08/27/20 [phone]

## 2020-08-26 NOTE — ED Triage Notes (Signed)
Pt c/o RLQ pain for past few days. Tonight pt became weak and had syncopal episode. Pt hypotensive for EMS. Received NS.

## 2020-08-27 ENCOUNTER — Observation Stay (HOSPITAL_BASED_OUTPATIENT_CLINIC_OR_DEPARTMENT_OTHER): Payer: Medicaid Other

## 2020-08-27 ENCOUNTER — Emergency Department (HOSPITAL_COMMUNITY): Payer: Medicaid Other

## 2020-08-27 DIAGNOSIS — I959 Hypotension, unspecified: Secondary | ICD-10-CM | POA: Diagnosis present

## 2020-08-27 DIAGNOSIS — N179 Acute kidney failure, unspecified: Secondary | ICD-10-CM | POA: Diagnosis present

## 2020-08-27 DIAGNOSIS — Z7984 Long term (current) use of oral hypoglycemic drugs: Secondary | ICD-10-CM | POA: Diagnosis not present

## 2020-08-27 DIAGNOSIS — A0839 Other viral enteritis: Secondary | ICD-10-CM | POA: Diagnosis present

## 2020-08-27 DIAGNOSIS — E119 Type 2 diabetes mellitus without complications: Secondary | ICD-10-CM | POA: Diagnosis present

## 2020-08-27 DIAGNOSIS — R55 Syncope and collapse: Secondary | ICD-10-CM | POA: Diagnosis present

## 2020-08-27 DIAGNOSIS — K760 Fatty (change of) liver, not elsewhere classified: Secondary | ICD-10-CM | POA: Diagnosis present

## 2020-08-27 DIAGNOSIS — Z87891 Personal history of nicotine dependence: Secondary | ICD-10-CM | POA: Diagnosis not present

## 2020-08-27 DIAGNOSIS — Z79899 Other long term (current) drug therapy: Secondary | ICD-10-CM | POA: Diagnosis not present

## 2020-08-27 DIAGNOSIS — U071 COVID-19: Secondary | ICD-10-CM | POA: Diagnosis present

## 2020-08-27 DIAGNOSIS — E860 Dehydration: Secondary | ICD-10-CM

## 2020-08-27 DIAGNOSIS — F32A Depression, unspecified: Secondary | ICD-10-CM | POA: Diagnosis present

## 2020-08-27 DIAGNOSIS — I5042 Chronic combined systolic (congestive) and diastolic (congestive) heart failure: Secondary | ICD-10-CM | POA: Diagnosis present

## 2020-08-27 DIAGNOSIS — Z95810 Presence of automatic (implantable) cardiac defibrillator: Secondary | ICD-10-CM | POA: Diagnosis not present

## 2020-08-27 DIAGNOSIS — K4090 Unilateral inguinal hernia, without obstruction or gangrene, not specified as recurrent: Secondary | ICD-10-CM

## 2020-08-27 DIAGNOSIS — R197 Diarrhea, unspecified: Secondary | ICD-10-CM

## 2020-08-27 DIAGNOSIS — E861 Hypovolemia: Secondary | ICD-10-CM | POA: Diagnosis present

## 2020-08-27 DIAGNOSIS — I5022 Chronic systolic (congestive) heart failure: Secondary | ICD-10-CM | POA: Diagnosis not present

## 2020-08-27 DIAGNOSIS — K429 Umbilical hernia without obstruction or gangrene: Secondary | ICD-10-CM

## 2020-08-27 LAB — BASIC METABOLIC PANEL
Anion gap: 10 (ref 5–15)
BUN: 22 mg/dL — ABNORMAL HIGH (ref 6–20)
CO2: 17 mmol/L — ABNORMAL LOW (ref 22–32)
Calcium: 8.5 mg/dL — ABNORMAL LOW (ref 8.9–10.3)
Chloride: 111 mmol/L (ref 98–111)
Creatinine, Ser: 1.65 mg/dL — ABNORMAL HIGH (ref 0.61–1.24)
GFR, Estimated: 53 mL/min — ABNORMAL LOW (ref >60)
Glucose, Bld: 87 mg/dL (ref 70–99)
Potassium: 3.9 mmol/L (ref 3.5–5.1)
Sodium: 138 mmol/L (ref 135–145)

## 2020-08-27 LAB — HEMOGLOBIN A1C
Hgb A1c MFr Bld: 5.1 % (ref 4.8–5.6)
Mean Plasma Glucose: 99.67 mg/dL

## 2020-08-27 LAB — PROCALCITONIN: Procalcitonin: <0.1 ng/mL

## 2020-08-27 LAB — C DIFFICILE QUICK SCREEN W PCR REFLEX
C Diff antigen: NEGATIVE
C Diff interpretation: NOT DETECTED
C Diff toxin: NEGATIVE

## 2020-08-27 LAB — CBG MONITORING, ED
Glucose-Capillary: 88 mg/dL (ref 70–99)
Glucose-Capillary: 117 mg/dL — ABNORMAL HIGH (ref 70–99)

## 2020-08-27 LAB — ECHOCARDIOGRAM COMPLETE
Area-P 1/2: 2.77 cm2
Calc EF: 46.7 %
Height: 72 in
S' Lateral: 4.55 cm
Single Plane A2C EF: 40.3 %
Single Plane A4C EF: 55.1 %
Weight: 3280 [oz_av]

## 2020-08-27 LAB — GLUCOSE, CAPILLARY
Glucose-Capillary: 103 mg/dL — ABNORMAL HIGH (ref 70–99)
Glucose-Capillary: 92 mg/dL (ref 70–99)

## 2020-08-27 LAB — CBC
HCT: 44.8 % (ref 39.0–52.0)
Hemoglobin: 15.3 g/dL (ref 13.0–17.0)
MCH: 32.8 pg (ref 26.0–34.0)
MCHC: 34.2 g/dL (ref 30.0–36.0)
MCV: 95.9 fL (ref 80.0–100.0)
Platelets: 182 10*3/uL (ref 150–400)
RBC: 4.67 MIL/uL (ref 4.22–5.81)
RDW: 12.4 % (ref 11.5–15.5)
WBC: 4.1 10*3/uL (ref 4.0–10.5)
nRBC: 0 % (ref 0.0–0.2)

## 2020-08-27 LAB — CREATININE, SERUM
Creatinine, Ser: 1.86 mg/dL — ABNORMAL HIGH (ref 0.61–1.24)
GFR, Estimated: 45 mL/min — ABNORMAL LOW (ref >60)

## 2020-08-27 LAB — LACTIC ACID, PLASMA: Lactic Acid, Venous: 0.5 mmol/L (ref 0.5–1.9)

## 2020-08-27 LAB — TROPONIN I (HIGH SENSITIVITY): Troponin I (High Sensitivity): 6 ng/L (ref <18)

## 2020-08-27 LAB — CORTISOL-AM, BLOOD: Cortisol - AM: 14.6 ug/dL (ref 6.7–22.6)

## 2020-08-27 MED ORDER — ZINC SULFATE 220 (50 ZN) MG PO CAPS
220.0000 mg | ORAL_CAPSULE | Freq: Every day | ORAL | Status: DC
  Administered 2020-08-27 – 2020-08-29 (×3): 220 mg via ORAL
  Filled 2020-08-27 (×3): qty 1

## 2020-08-27 MED ORDER — SODIUM CHLORIDE 0.9 % IV SOLN
100.0000 mL/h | INTRAVENOUS | Status: DC
  Administered 2020-08-27: 100 mL/h via INTRAVENOUS

## 2020-08-27 MED ORDER — CHLORHEXIDINE GLUCONATE CLOTH 2 % EX PADS
6.0000 | MEDICATED_PAD | Freq: Every day | CUTANEOUS | Status: DC
  Administered 2020-08-27 – 2020-08-29 (×3): 6 via TOPICAL

## 2020-08-27 MED ORDER — SODIUM CHLORIDE 0.9 % IV SOLN
100.0000 mg | Freq: Every day | INTRAVENOUS | Status: DC
  Administered 2020-08-28 – 2020-08-29 (×2): 100 mg via INTRAVENOUS
  Filled 2020-08-27 (×2): qty 20

## 2020-08-27 MED ORDER — SODIUM CHLORIDE 0.9 % IV SOLN
100.0000 mg | INTRAVENOUS | Status: AC
  Administered 2020-08-27 (×2): 100 mg via INTRAVENOUS
  Filled 2020-08-27 (×2): qty 20

## 2020-08-27 MED ORDER — INSULIN ASPART 100 UNIT/ML ~~LOC~~ SOLN: 0.0000 [IU] | Freq: Every day | SUBCUTANEOUS | Status: DC

## 2020-08-27 MED ORDER — INSULIN ASPART 100 UNIT/ML ~~LOC~~ SOLN
0.0000 [IU] | Freq: Three times a day (TID) | SUBCUTANEOUS | Status: DC
  Administered 2020-08-28: 1 [IU] via SUBCUTANEOUS

## 2020-08-27 MED ORDER — LACTATED RINGERS IV SOLN: INTRAVENOUS | Status: AC

## 2020-08-27 MED ORDER — ACETAMINOPHEN 325 MG PO TABS
650.0000 mg | ORAL_TABLET | Freq: Four times a day (QID) | ORAL | Status: DC | PRN
  Administered 2020-08-27: 650 mg via ORAL
  Filled 2020-08-27: qty 2

## 2020-08-27 MED ORDER — ENOXAPARIN SODIUM 40 MG/0.4ML ~~LOC~~ SOLN
40.0000 mg | SUBCUTANEOUS | Status: DC
  Administered 2020-08-27 – 2020-08-29 (×3): 40 mg via SUBCUTANEOUS
  Filled 2020-08-27 (×3): qty 0.4

## 2020-08-27 MED ORDER — DIGOXIN 125 MCG PO TABS
0.1250 mg | ORAL_TABLET | Freq: Every day | ORAL | Status: DC
  Administered 2020-08-27 – 2020-08-29 (×3): 0.125 mg via ORAL
  Filled 2020-08-27 (×3): qty 1

## 2020-08-27 MED ORDER — ASCORBIC ACID 500 MG PO TABS
500.0000 mg | ORAL_TABLET | Freq: Every day | ORAL | Status: DC
  Administered 2020-08-27 – 2020-08-29 (×3): 500 mg via ORAL
  Filled 2020-08-27 (×4): qty 1

## 2020-08-27 NOTE — ED Notes (Addendum)
Dr. Arbutus Leas paged regarding patient's heart rate. Pt denies chest pain or shortness of breath at this time. Denies numbness, tingling, headache, dizziness or lightheadedness. Awaiting a page back at this time.

## 2020-08-27 NOTE — ED Notes (Signed)
Echo at bedside at this time. Pt appears to be resting in bed, no signs of acute distress noted. Bed is locked in the lowest position, side rails x1 and call bell within reach.

## 2020-08-27 NOTE — Progress Notes (Signed)
PROGRESS NOTE  Andrew Andrew OVZ:[id number] DOB: 03-20-1976 DOA: 08/26/2020 PCP: Ponciano Ort [facility]  Brief History:  45-year-old male with a history of systolic CHF with EF 30%, ICD placement, subarachnoid hemorrhage February 2021, and depression presenting with 3-day history of diarrhea, coughing, and nasal congestion.  Patient also had some associated abdominal pain and generalized weakness.  He denies any fevers, chills, chest pain, shortness of breath, hemoptysis, nausea, vomiting.  The patient was getting up from the couch when he felt dizzy and had a syncopal episode lasting about 30 seconds.  There was no aura.  There was no loss of bowel or bladder.  He denied biting his tongue.  He denied any prior chest pain, shortness of breath, palpitations.  He has had 8-9 loose bowel movements on a daily basis for last 3 days.  There is no hematochezia or melena.  He had some upper abdominal pain.  There is no dysuria or hematuria.  He has had some nausea without emesis.  He has had decreased oral intake. In the emergency department, the patient was afebrile with soft blood pressures.  Oxygen saturation was 96% on room air.  WBC 5.1, hemoglobin 15.4, platelets 170,000.  BMP showed a serum creatinine of 2.97 which is above his usual baseline.  CT of the abdomen and pelvis showed liquid stool throughout the colon consistent with diarrhea.  There was hepatic steatosis.  There is fat-containing umbilical hernia.  There is no hydronephrosis.  Negative gallbladder  Assessment/Plan: COVID-19 gastroenteritis -Manifested by nausea and diarrhea -C. difficile negative -Stool pathogen panel -08/27/2020-CT abd as discussed above  Hypotension -Lactic acid peaked at 1.7>> 0.5 -Secondary to volume depletion -A.m. cortisol -Check procalcitonin -Blood cultures x2 -Switch fluids to LR  Syncope -Secondary to volume depletion and hypotension -AICD interrogated--noted concerning  dysrhythmia -Personally reviewed chest x-ray--no pulmonary edema or infiltrates  Acute kidney injury -Baseline creatinine 1.0-1.2 -Presented with serum creatinine 2.97 -Continue IV fluids -Holding Entresto  Chronic systolic and diastolic CHF -02/05/2020 echo EF 30%, grade 2 DD -Holding Entresto, carvedilol, eplerenone secondary to hypotension -Holding Farxiga secondary to AKI temporarily -Continue digoxin  Depression -Continue Zoloft  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        Family Communication:   No Family at bedside  Consultants:  none  Code Status:  FULL   DVT ProphylaxisSC Lovenox   Procedures: As Listed in Progress Note Above  Antibiotics: None   Total time spent 35 minutes.  Greater than 50% spent face to face counseling and coordinating care.     Subjective: Patient continues to have loose stools.  His abdominal pain is a little better.  He has poor appetite.  He denies any fevers, chills, headache, chest pain, shortness of breath.  He has nasal congestion.  Abdominal pain is improved.  There is no hematochezia or melena.  Objective: Vitals:   08/27/20 0430 08/27/20 0500 08/27/20 0530 08/27/20 0600  BP: (!) 77/42 (!) 99/58 (!) 88/49 (!) 83/46  Pulse: (!) 55 (!) 113 (!) 46 70  Resp: 13 15 13 14   Temp:    98.5 F (36.9 C)  TempSrc:    Oral  SpO2: 96% 94% 96% 96%  Weight:      Height:        Intake/Output Summary (Last 24 hours) at 08/27/2020 0739 Last data filed at 08/27/2020 0139 Gross per 24 hour  Intake 1000 ml  Output --  Net 1000 ml   Weight change:  Exam:   General:  Pt is alert, follows  commands appropriately, not in acute distress  HEENT: No icterus, No thrush, No neck mass, [place]/AT  Cardiovascular: RRR, S1/S2, no rubs, no gallops  Respiratory: Fine bibasilar crackles.  No wheezing  Abdomen: Soft/+BS, non tender, non distended, no guarding  Extremities: No edema, No lymphangitis, No petechiae, No rashes, no synovitis   Data Reviewed: I have personally reviewed following labs and imaging studies Basic Metabolic Panel: Recent Labs  Lab 08/26/20 2115 08/27/20 0523  NA 135  --   K 3.8  --   CL 104  --   CO2 20*  --   GLUCOSE 84  --   BUN 30*  --   CREATININE 2.97* 1.86*  CALCIUM 8.7*  --    Liver Function Tests: Recent Labs  Lab 08/26/20 2115  AST 44*  ALT 40  ALKPHOS 52  BILITOT 0.6  PROT 7.8  ALBUMIN 4.3   Recent Labs  Lab 08/26/20 2115  LIPASE 46   No results for input(s): AMMONIA in the last 168 hours. Coagulation Profile: No results for input(s): INR, PROTIME in the last 168 hours. CBC: Recent Labs  Lab 08/26/20 2115 08/27/20 0523  WBC 5.1 4.1  NEUTROABS 3.3  --   HGB 15.4 15.3  HCT 44.7 44.8  MCV 95.3 95.9  PLT 170 182   Cardiac Enzymes: No results for input(s): CKTOTAL, CKMB, CKMBINDEX, TROPONINI in the last 168 hours. BNP: Invalid input(s): POCBNP CBG: No results for input(s): GLUCAP in the last 168 hours. HbA1C: No results for input(s): HGBA1C in the last 72 hours. Urine analysis:    Component Value Date/Time   COLORURINE YELLOW 08/25/2019 1421   APPEARANCEUR CLEAR 08/25/2019 1421   LABSPEC 1.023 08/25/2019 1421   PHURINE 5.0 08/25/2019 1421   GLUCOSEU NEGATIVE 08/25/2019 1421   HGBUR NEGATIVE 08/25/2019 1421   BILIRUBINUR NEGATIVE 08/25/2019 1421   KETONESUR NEGATIVE 08/25/2019 1421   PROTEINUR 30 (A) 08/25/2019 1421   NITRITE NEGATIVE 08/25/2019 1421   LEUKOCYTESUR NEGATIVE 08/25/2019 1421   Sepsis Labs: @LABRCNTIP (procalcitonin:4,lacticidven:4) ) Recent Results (from the past 240 hour(s))  Resp Panel by  RT-PCR (Flu A&B, Covid) Nasopharyngeal Swab     Status: Abnormal   Collection Time: 08/26/20  9:16 PM   Specimen: Nasopharyngeal Swab; Nasopharyngeal(NP) swabs in vial transport medium  Result Value Ref Range Status   SARS Coronavirus 2 by RT PCR POSITIVE (A) NEGATIVE Final    Comment: RESULT CALLED TO, READ BACK BY AND VERIFIED WITH: TURNER,C @ 2314 ON 08/26/20 BY JUW (NOTE) SARS-CoV-2 target nucleic acids are DETECTED.  The SARS-CoV-2 RNA is generally detectable in upper respiratory specimens during the acute phase of infection. Positive results are indicative of the presence of the identified virus, but do not rule out bacterial infection or co-infection with other pathogens not detected by the test. Clinical correlation with patient history and other diagnostic information is necessary to determine patient infection status.  The expected result is Negative.  Fact Sheet for Patients: BloggerCourse.com  Fact Sheet for Healthcare Providers: SeriousBroker.it  This test is not yet approved or cleared by the [place] FDA and  has been authorized for detection and/or diagnosis of SARS-CoV-2 by FDA under an Emergency Use Authorization (EUA).  This EUA will remain in effect (meaning this test can be  used) for the duration of  the COVID-19 declaration under Section 564(b)(1) of the Act, 21 U.S.C. section 360bbb-3(b)(1), unless the authorization is terminated or revoked sooner.     Influenza A by PCR NEGATIVE NEGATIVE Final   Influenza B by PCR NEGATIVE NEGATIVE Final    Comment: (NOTE) The Xpert Xpress SARS-CoV-2/FLU/RSV plus assay is intended as an aid in the diagnosis of influenza from Nasopharyngeal swab specimens and should not be used as a sole basis for treatment. Nasal washings and aspirates are unacceptable for Xpert Xpress SARS-CoV-2/FLU/RSV testing.  Fact Sheet for  Patients: BloggerCourse.com  Fact Sheet for Healthcare Providers: SeriousBroker.it  This test is not yet approved or cleared by the [place] FDA and has been authorized for detection and/or diagnosis of SARS-CoV-2 by FDA under an Emergency Use Authorization (EUA). This EUA will remain in effect (meaning this test can be used) for the duration of the COVID-19 declaration under Section 564(b)(1) of the Act, 21 U.S.C. section 360bbb-3(b)(1), unless the authorization is terminated or revoked.  Performed at [facility], [address], [place], [place] [address]   C Difficile Quick Screen w PCR reflex     Status: None   Collection Time: 08/27/20  4:10 AM   Specimen: STOOL  Result Value Ref Range Status   C Diff antigen NEGATIVE NEGATIVE Final   C Diff toxin NEGATIVE NEGATIVE Final   C Diff interpretation No C. difficile detected.  Final    Comment: Performed at [facility], [address], [place], [place] [address]     Scheduled Meds: . vitamin C  500 mg Oral Daily  . digoxin  0.125 mg Oral Daily  . enoxaparin (LOVENOX) injection  40 mg Subcutaneous Q24H  . insulin aspart  0-5 Units Subcutaneous QHS  . insulin aspart  0-9 Units Subcutaneous TID WC  . zinc sulfate  220 mg Oral Daily   Continuous Infusions: . sodium chloride 100 mL/hr (08/27/20 0505)    Procedures/Studies: CT ABDOMEN PELVIS WO CONTRAST  Result Date: 08/27/2020 CLINICAL DATA:  Right lower quadrant pain. EXAM: CT ABDOMEN AND PELVIS WITHOUT CONTRAST TECHNIQUE: Multidetector CT imaging of the abdomen and pelvis was performed following the standard protocol without IV contrast. COMPARISON:  October 08, 2019 FINDINGS: Lower chest: The lung bases are clear. The heart size is normal. Hepatobiliary: There is decreased hepatic attenuation suggestive of hepatic steatosis. Normal gallbladder.There is no biliary ductal dilation. Pancreas: Normal contours without ductal  dilatation. No peripancreatic fluid collection. Spleen: Unremarkable. Adrenals/Urinary Tract: --Adrenal glands: Unremarkable. --Right kidney/ureter: No hydronephrosis or radiopaque kidney stones. --Left kidney/ureter: No hydronephrosis or radiopaque kidney stones. --Urinary bladder: Unremarkable. Stomach/Bowel: --Stomach/Duodenum: No hiatal hernia or other gastric abnormality. Normal duodenal course and caliber. --Small bowel: Unremarkable. --Colon: There is liquid stool throughout the colon. --Appendix: Not visualized. No right lower quadrant inflammation or free fluid. Vascular/Lymphatic: Normal course and caliber of the major abdominal vessels. --No retroperitoneal lymphadenopathy. --No mesenteric lymphadenopathy. --No pelvic or inguinal lymphadenopathy. Reproductive: Unremarkable Other: No ascites or free air. There is a fat containing left inguinal hernia. There is a fat containing umbilical hernia. Musculoskeletal. No acute displaced fractures.  IMPRESSION: 1. Liquid stool throughout the colon consistent with diarrheal illness. 2. Hepatic steatosis. 3. Fat containing left inguinal and umbilical hernias. Electronically Signed   By: Katherine Mantle M.D.   On: 08/27/2020 01:09   DG Chest 2 View  Result Date: 08/10/2020 CLINICAL DATA:  Cough and fever. EXAM: CHEST - 2 VIEW COMPARISON:  07/13/2020 FINDINGS: Single lead left-sided pacemaker in place.The cardiomediastinal contours are normal. The lungs are clear. Pulmonary vasculature is normal. No consolidation, pleural effusion, or pneumothorax. No acute osseous abnormalities are seen. IMPRESSION: No acute chest findings. Electronically Signed   By: Narda Rutherford M.D.   On: 08/10/2020 17:49   DG Chest Portable 1 View  Result Date: 08/26/2020 CLINICAL DATA:  Syncope EXAM: PORTABLE CHEST 1 VIEW COMPARISON:  08/10/2020 FINDINGS: Left-sided pacing device. No focal opacity or pleural effusion. Normal cardiomediastinal silhouette. No pneumothorax.  IMPRESSION: No active disease. Electronically Signed   By: Jasmine Pang M.D.   On: 08/26/2020 21:51    Catarina Hartshorn, DO  [facility]  If 7PM-7AM, please contact night-coverage [email or web address] Password [facility] 08/27/2020, 7:39 AM   LOS: 0 days

## 2020-08-27 NOTE — ED Notes (Signed)
Pt sitting up in bed with lunch tray at this time. Pt is tolerating PO intake without distress.

## 2020-08-27 NOTE — ED Notes (Signed)
Meal tray given 

## 2020-08-27 NOTE — ED Notes (Signed)
Meal tray given. Warm wet wash cloth provided to pt per request to wash up. Assisted in comfort measures

## 2020-08-27 NOTE — H&P (Signed)
History and Physical  Evren Shankland LPF:[id number] DOB: [birth year] DOA: 08/26/2020  Referring physician: Sabas Sous, MD PCP: Ponciano Ort [facility]  Patient coming from: Home  Chief Complaint: Syncope  HPI: Andrew Hinton is a 45 y.o. male with medical history significant for CHF (LVEF 30%- June 2021) who presents to the emergency department via EMS due to syncopal episode at home PTA.  Patient endorsed 3-day onset of watery diarrhea of 8-10 episodes daily with resultant fatigue, patient states that he was weak and was sitting on sofa at home, when he tries to stand up from the sofa, he states that he passed out and was told by his family that he was only unconscious for a few seconds.  EMS was activated, on arrival of EMS, BP was within hypotensive range and 500 mL of IV NS was provided in route to the hospital.  He denies fever, chills, chest pain, shortness of breath.  Patient denies having any sick contacts  ED Course: In the Emergency Department, BP on arrival was low at 73/46, IV NS of 500 mls was given with slight improvement in BP.  Work-up in the ED showed normal CBC, BUN/creatinine 30/2.97 (baseline creatinine at 1.0-1.2).  SARS coronavirus 2 was positive. CT abdomen and pelvis showed liquid stool throughout the colon consistent with diarrheal illness, hepatic steatosis and fat containing left inguinal and umbilical hernias Chest x-ray showed no active disease Patient was brought with IV hydration of 500 mL.  Hospitalist was asked to admit patient for further evaluation and management.  Review of Systems: Constitutional: Negative for chills and fever.  HENT: Negative for ear pain and sore throat.   Eyes: Negative for pain and visual disturbance.  Respiratory: Negative for cough, chest tightness and shortness of breath.   Cardiovascular: Positive for syncope.  Negative for chest pain and palpitations.  Gastrointestinal: Positive for diarrhea.  Negative for abdominal  pain and vomiting.  Endocrine: Negative for polyphagia and polyuria.  Genitourinary: Negative for decreased urine volume, dysuria, enuresis Musculoskeletal: Negative for arthralgias and back pain.  Skin: Negative for color change and rash.  Allergic/Immunologic: Negative for immunocompromised state.  Neurological: Negative for tremors, speech difficulty All other systems reviewed and are negative  Past Medical History:  Diagnosis Date  . Anxiety   . Asthma   . Depression   . Nerve damage    Past Surgical History:  Procedure Laterality Date  . APPENDECTOMY    . ICD IMPLANT N/A 03/22/2020   Procedure: ICD IMPLANT;  Surgeon: Marinus Maw, MD;  Location: [facility] INVASIVE CV LAB;  Service: Cardiovascular;  Laterality: N/A;  . RIGHT/LEFT HEART CATH AND CORONARY ANGIOGRAPHY N/A 10/12/2019   Procedure: RIGHT/LEFT HEART CATH AND CORONARY ANGIOGRAPHY;  Surgeon: Laurey Morale, MD;  Location: [facility] INVASIVE CV LAB;  Service: Cardiovascular;  Laterality: N/A;    Social History:  reports that he quit smoking about 2 years ago. His smoking use included cigarettes. He smoked 0.50 packs per day. He has never used smokeless tobacco. He reports current alcohol use. He reports that he does not use drugs.   Allergies  Allergen Reactions  . Spironolactone     Gynecomastia/breast tenderness    History reviewed. No pertinent family history.   Prior to Admission medications   Medication Sig Start Date End Date Taking? Authorizing Provider  carvedilol (COREG) 6.25 MG tablet Take 1.5 tablets (9.375 mg total) by mouth 2 (two) times daily with a meal. 05/09/20  Yes Laurey Morale, MD  dapagliflozin  propanediol (FARXIGA) 10 MG TABS tablet Take 1 tablet (10 mg total) by mouth daily before breakfast. 02/11/20  Yes Laurey Morale, MD  digoxin (LANOXIN) 0.125 MG tablet Take 1 tablet (0.125 mg total) by mouth daily. 02/11/20  Yes Laurey Morale, MD  ENTRESTO 97-103 MG TAKE 1 TABLET BY MOUTH TWICE DAILY 08/10/20   Yes Laurey Morale, MD  ibuprofen (ADVIL) 200 MG tablet Take 200 mg by mouth every 6 (six) hours as needed.   Yes [provider]  meclizine (ANTIVERT) 25 MG tablet Take 0.5 tablets (12.5 mg total) by mouth 3 (three) times daily as needed for dizziness. 11/10/19  Yes Robbie Lis M, PA-C  methocarbamol (ROBAXIN) 500 MG tablet Take 1 tablet (500 mg total) by mouth every 8 (eight) hours as needed for muscle spasms. 07/13/20  Yes Sabas Sous, MD  ondansetron (ZOFRAN ODT) 8 MG disintegrating tablet Take 1 tablet (8 mg total) by mouth every 8 (eight) hours as needed for nausea. 08/10/20  Yes Derwood Kaplan, MD  sertraline (ZOLOFT) 25 MG tablet Take 25 mg by mouth daily. 07/19/20  Yes [provider]  amoxicillin-clavulanate (AUGMENTIN) 875-125 MG tablet Take 1 tablet by mouth every 12 (twelve) hours. Patient not taking: Reported on 08/10/2020 04/14/20   Burgess Amor, PA-C  eplerenone (INSPRA) 25 MG tablet Take 25 mg by mouth daily.  05/12/20   [provider]  Pseudoeph-Doxylamine-DM-APAP (NYQUIL PO) Take 1 tablet by mouth daily. Patient not taking: Reported on 08/26/2020    [provider]  Pseudoephedrine-APAP-DM (DAYQUIL PO) Take 1 tablet by mouth daily. Patient not taking: Reported on 08/26/2020    [provider]    Physical Exam: BP (!) 80/59   Pulse 64   Resp 14   Ht 6' (1.829 m)   Wt 93 kg   SpO2 93%   BMI 27.80 kg/m   . General: 45 y.o. year-old male well developed well nourished in no acute distress.  Alert and oriented x3. Marland Kitchen HEENT: Dry mucous membrane.  NCAT, EOMI . Neck: Supple, trachea medial . Cardiovascular: AICD incision site noted.  Regular rate and rhythm with no rubs or gallops.  No thyromegaly or JVD noted.  No lower extremity edema. 2/4 pulses in all 4 extremities. Marland Kitchen Respiratory: Clear to auscultation with no wheezes or rales. Good inspiratory effort. . Abdomen: Soft nontender nondistended with normal bowel sounds x4  quadrants. . Muskuloskeletal: No cyanosis, clubbing or edema noted bilaterally . Neuro: CN II-XII intact, strength, sensation, reflexes intact . Skin: No ulcerative lesions noted or rashes . Psychiatry: Judgement and insight appear normal. Mood is appropriate for condition and setting          Labs on Admission:  Basic Metabolic Panel: Recent Labs  Lab 08/26/20 2115  NA 135  K 3.8  CL 104  CO2 20*  GLUCOSE 84  BUN 30*  CREATININE 2.97*  CALCIUM 8.7*   Liver Function Tests: Recent Labs  Lab 08/26/20 2115  AST 44*  ALT 40  ALKPHOS 52  BILITOT 0.6  PROT 7.8  ALBUMIN 4.3   Recent Labs  Lab 08/26/20 2115  LIPASE 46   No results for input(s): AMMONIA in the last 168 hours. CBC: Recent Labs  Lab 08/26/20 2115  WBC 5.1  NEUTROABS 3.3  HGB 15.4  HCT 44.7  MCV 95.3  PLT 170   Cardiac Enzymes: No results for input(s): CKTOTAL, CKMB, CKMBINDEX, TROPONINI in the last 168 hours.  BNP (last 3 results) Recent Labs  08/26/20 2154  BNP 23.0    ProBNP (last 3 results) No results for input(s): PROBNP in the last 8760 hours.  CBG: No results for input(s): GLUCAP in the last 168 hours.  Radiological Exams on Admission: CT ABDOMEN PELVIS WO CONTRAST  Result Date: 08/27/2020 CLINICAL DATA:  Right lower quadrant pain. EXAM: CT ABDOMEN AND PELVIS WITHOUT CONTRAST TECHNIQUE: Multidetector CT imaging of the abdomen and pelvis was performed following the standard protocol without IV contrast. COMPARISON:  October 08, 2019 FINDINGS: Lower chest: The lung bases are clear. The heart size is normal. Hepatobiliary: There is decreased hepatic attenuation suggestive of hepatic steatosis. Normal gallbladder.There is no biliary ductal dilation. Pancreas: Normal contours without ductal dilatation. No peripancreatic fluid collection. Spleen: Unremarkable. Adrenals/Urinary Tract: --Adrenal glands: Unremarkable. --Right kidney/ureter: No hydronephrosis or radiopaque kidney stones.  --Left kidney/ureter: No hydronephrosis or radiopaque kidney stones. --Urinary bladder: Unremarkable. Stomach/Bowel: --Stomach/Duodenum: No hiatal hernia or other gastric abnormality. Normal duodenal course and caliber. --Small bowel: Unremarkable. --Colon: There is liquid stool throughout the colon. --Appendix: Not visualized. No right lower quadrant inflammation or free fluid. Vascular/Lymphatic: Normal course and caliber of the major abdominal vessels. --No retroperitoneal lymphadenopathy. --No mesenteric lymphadenopathy. --No pelvic or inguinal lymphadenopathy. Reproductive: Unremarkable Other: No ascites or free air. There is a fat containing left inguinal hernia. There is a fat containing umbilical hernia. Musculoskeletal. No acute displaced fractures. IMPRESSION: 1. Liquid stool throughout the colon consistent with diarrheal illness. 2. Hepatic steatosis. 3. Fat containing left inguinal and umbilical hernias. Electronically Signed   By: Katherine Mantle M.D.   On: 08/27/2020 01:09   DG Chest Portable 1 View  Result Date: 08/26/2020 CLINICAL DATA:  Syncope EXAM: PORTABLE CHEST 1 VIEW COMPARISON:  08/10/2020 FINDINGS: Left-sided pacing device. No focal opacity or pleural effusion. Normal cardiomediastinal silhouette. No pneumothorax. IMPRESSION: No active disease. Electronically Signed   By: Jasmine Pang M.D.   On: 08/26/2020 21:51    EKG: I independently viewed the EKG done and my findings are as followed: Sinus bradycardia at rate of 59 bpm, T wave inversion in the inferior leads and V4-V6 (no change from prior EKG done on 07/05/2020)  Assessment/Plan Present on Admission: . Syncope . AKI (acute kidney injury) (HCC) . Chronic systolic heart failure (HCC)  Principal Problem:   Syncope Active Problems:   AKI (acute kidney injury) (HCC)   Chronic systolic heart failure (HCC)   COVID-19 virus infection   Acute diarrhea   Dehydration   Hepatic steatosis   Umbilical hernia   Left  inguinal hernia  Acute syncope This may be due to volume depletion considering several days of multiple episodes of diarrhea Patient will be admitted to telemetry unit, monitor for arrhythmias Troponin x 2 - 9 > 6 EKG personally reviewed showed sinus bradycardia at rate of 59 bpm, T wave inversion in the inferior leads and V4-V6 (no change from prior EKG done on 07/05/2020) Echocardiogram will be done in the morning to rule out significant aortic stenosis or other outflow obstruction, and also to evaluate EF and to rule out segmental/Regional wall motion abnormalities.   Acute diarrhea in the setting of COVID-19 virus infection SARS coronavirus 2 was positive Patient does not have any shortness of breath or any respiratory symptoms C. difficile and GI panel will be done to rule out possible bacterial cause of diarrhea Gentle hydration will be provided due to dehydration and AKI (considering the patient has COVID-19 virus infection) No indication for IV steroids or remdesivir at this time  Continue vitamin-C 500 mg p.o. Daily Continue zinc 220 mg p.o. Daily Continue monitoring daily inflammatory markers Physician PPE: Surgical mask with face shield, N-95, nonsterile gloves, disposable gown, head and shoe covers Patient PPE:  Face mask   Dehydration/acute kidney injury BUN/creatinine 30/2.97 (baseline creatinine at 1.0-1.2). Continue gentle hydration as described above Renally adjust medications, avoid nephrotoxic agents/dehydration/hypotension  Hepatic steatosis/umbilical hernia/left inguinal hernia per CT abdomen and pelvis Stable  History of chronic systolic CHF Continue digoxin Coreg and Entresto will be temporarily held due to soft BP  Type 2 DM Continue ISS and hypoglycemic protocol  DVT prophylaxis: Lovenox  Code Status: Full code  Family Communication: None at bedside  Disposition Plan: Patient is from:                        home Anticipated DC to:                     Home Anticipated DC date:                1 day Anticipated DC barriers:           Patient is not stable to be discharged at this time due to syncopal episode requiring further work-up  Consults called:  Admission status: Observation  Frankey Shown MD [facility]  08/27/2020, 3:57 AM

## 2020-08-27 NOTE — Progress Notes (Signed)
  Echocardiogram 2D Echocardiogram has been performed.  Andrew Hinton F 08/27/2020, 11:35 AM

## 2020-08-27 NOTE — ED Notes (Signed)

## 2020-08-27 NOTE — ED Notes (Signed)
New orders obtained at this time. Pt remains in no signs of distress. All questions and concerns voiced addressed. Bed is locked in the lowest position, side rails x1 at patient request, cardiac monitor in place and vital signs cycling q30 minutes. Will continue to monitor at this time.

## 2020-08-28 DIAGNOSIS — N179 Acute kidney failure, unspecified: Secondary | ICD-10-CM | POA: Diagnosis not present

## 2020-08-28 DIAGNOSIS — I5042 Chronic combined systolic (congestive) and diastolic (congestive) heart failure: Secondary | ICD-10-CM | POA: Diagnosis not present

## 2020-08-28 DIAGNOSIS — R55 Syncope and collapse: Secondary | ICD-10-CM | POA: Diagnosis not present

## 2020-08-28 DIAGNOSIS — U071 COVID-19: Secondary | ICD-10-CM | POA: Diagnosis not present

## 2020-08-28 LAB — D-DIMER, QUANTITATIVE: D-Dimer, Quant: 0.5 ug/mL-FEU (ref 0.00–0.50)

## 2020-08-28 LAB — GASTROINTESTINAL PANEL BY PCR, STOOL (REPLACES STOOL CULTURE)

## 2020-08-28 LAB — CBC WITH DIFFERENTIAL/PLATELET
Abs Immature Granulocytes: 0.01 10*3/uL (ref 0.00–0.07)
Basophils Absolute: 0 10*3/uL (ref 0.0–0.1)
Basophils Relative: 1 %
Eosinophils Absolute: 0.4 10*3/uL (ref 0.0–0.5)
Eosinophils Relative: 11 %
HCT: 44.7 % (ref 39.0–52.0)
Hemoglobin: 15 g/dL (ref 13.0–17.0)
Immature Granulocytes: 0 %
Lymphocytes Relative: 42 %
Lymphs Abs: 1.5 10*3/uL (ref 0.7–4.0)
MCH: 32.1 pg (ref 26.0–34.0)
MCHC: 33.6 g/dL (ref 30.0–36.0)
MCV: 95.5 fL (ref 80.0–100.0)
Monocytes Absolute: 0.5 10*3/uL (ref 0.1–1.0)
Monocytes Relative: 15 %
Neutro Abs: 1.1 10*3/uL — ABNORMAL LOW (ref 1.7–7.7)
Neutrophils Relative %: 31 %
Platelets: 155 10*3/uL (ref 150–400)
RBC: 4.68 MIL/uL (ref 4.22–5.81)
RDW: 12.4 % (ref 11.5–15.5)
WBC: 3.4 10*3/uL — ABNORMAL LOW (ref 4.0–10.5)
nRBC: 0 % (ref 0.0–0.2)

## 2020-08-28 LAB — COMPREHENSIVE METABOLIC PANEL
ALT: 39 U/L (ref 0–44)
AST: 35 U/L (ref 15–41)
Albumin: 3.5 g/dL (ref 3.5–5.0)
Alkaline Phosphatase: 50 U/L (ref 38–126)
Anion gap: 8 (ref 5–15)
BUN: 12 mg/dL (ref 6–20)
CO2: 24 mmol/L (ref 22–32)
Calcium: 8.7 mg/dL — ABNORMAL LOW (ref 8.9–10.3)
Chloride: 107 mmol/L (ref 98–111)
Creatinine, Ser: 1.04 mg/dL (ref 0.61–1.24)
GFR, Estimated: >60 mL/min (ref >60)
Glucose, Bld: 95 mg/dL (ref 70–99)
Potassium: 3.8 mmol/L (ref 3.5–5.1)
Sodium: 139 mmol/L (ref 135–145)
Total Bilirubin: 0.4 mg/dL (ref 0.3–1.2)
Total Protein: 6.7 g/dL (ref 6.5–8.1)

## 2020-08-28 LAB — FERRITIN: Ferritin: 329 ng/mL (ref 24–336)

## 2020-08-28 LAB — C-REACTIVE PROTEIN: CRP: 0.6 mg/dL (ref <1.0)

## 2020-08-28 LAB — GLUCOSE, CAPILLARY
Glucose-Capillary: 106 mg/dL — ABNORMAL HIGH (ref 70–99)
Glucose-Capillary: 138 mg/dL — ABNORMAL HIGH (ref 70–99)
Glucose-Capillary: 96 mg/dL (ref 70–99)

## 2020-08-28 LAB — PROCALCITONIN: Procalcitonin: <0.1 ng/mL

## 2020-08-28 LAB — MRSA PCR SCREENING: MRSA by PCR: POSITIVE — AB

## 2020-08-28 LAB — PHOSPHORUS: Phosphorus: 2.3 mg/dL — ABNORMAL LOW (ref 2.5–4.6)

## 2020-08-28 LAB — MAGNESIUM: Magnesium: 1.9 mg/dL (ref 1.7–2.4)

## 2020-08-28 MED ORDER — LACTATED RINGERS IV SOLN: INTRAVENOUS | Status: DC

## 2020-08-28 MED ORDER — LOPERAMIDE HCL 2 MG PO CAPS: 2.0000 mg | ORAL_CAPSULE | ORAL | Status: DC | PRN

## 2020-08-28 MED ORDER — MUPIROCIN 2 % EX OINT
1.0000 "application " | TOPICAL_OINTMENT | Freq: Two times a day (BID) | CUTANEOUS | Status: DC
  Administered 2020-08-28 – 2020-08-29 (×3): 1 via NASAL
  Filled 2020-08-28: qty 22

## 2020-08-28 MED ORDER — CHLORHEXIDINE GLUCONATE CLOTH 2 % EX PADS
6.0000 | MEDICATED_PAD | Freq: Every day | CUTANEOUS | Status: DC
  Administered 2020-08-28 – 2020-08-29 (×2): 6 via TOPICAL

## 2020-08-28 NOTE — Discharge Summary (Signed)
Physician Discharge Summary  Andrew Hinton AVW:[id number] DOB: [birth year] DOA: 08/26/2020  PCP: [facility]  Admit date: 08/26/2020 Discharge date: 08/29/2020  Admitted From: Home Disposition:  Home  Recommendations for Outpatient Follow-up:  1. Follow up with PCP in 1-2 weeks 2. Please obtain BMP/CBC in one week    Discharge Condition: Stable CODE STATUS:FULL Diet recommendation: Heart Healthy    Brief/Interim Summary: 45 year old male with a history of systolic CHF with EF 30%, ICD placement, subarachnoid hemorrhage February 2021, and depression presenting with 3-day history of diarrhea, coughing, and nasal congestion. Patient also had some associated abdominal pain and generalized weakness. He denies any fevers, chills, chest pain, shortness of breath, hemoptysis, nausea, vomiting. The patient was getting up from the couch when he felt dizzy and had a syncopal episode lasting about 30 seconds. There was no aura. There was no loss of bowel or bladder. He denied biting his tongue. He denied any prior chest pain, shortness of breath, palpitations. He has had 8-9 loose bowel movements on a daily basis for last 3 days. There is no hematochezia or melena. He had some upper abdominal pain. There is no dysuria or hematuria. He has had some nausea without emesis. He has had decreased oral intake. In the emergency department, the patient was afebrile with hypotension and SBPs in low 80s Oxygen saturation was 96% on room air. WBC 5.1, hemoglobin 15.4, platelets 170,000. BMP showed a serum creatinine of 2.97 which is above his usual baseline. CT of the abdomen and pelvis showed liquid stool throughout the colon consistent with diarrhea. There was hepatic steatosis. There is fat-containing umbilical hernia. There is no hydronephrosis. Negative gallbladder.  He was fluid bolused and started on IVF.  Discharge Diagnoses:  COVID-19 gastroenteritis -Manifested by  nausea and diarrhea -personally reviewed CXR--bilateral patchy GGO--stable on RA for entire hospitalization -C. difficile negative -Stool pathogen panel--neg -08/27/2020-CT abdas discussed above -loperamide prn  -diarrhea improved at time of dc  Hypotension -Lactic acid peaked at 1.7>>0.5 -Secondary to volume depletion -Holding Entresto and coreg>>restart after d/c -A.m. cortisol--14.6 -blood cultures neg -UA no pyuria -Check procalcitonin <0.10 -Blood cultures x2--neg to date -Switch fluids to LR -overall improved, but SBPs running soft 100-110 -instructed to restart coreg and entresto 08/30/20 and monitor BPs at home.  Call Dr. Shirlee Latch or PCP if SPBs running less than 100 after restarting meds  Syncope -Secondary to volume depletion and hypotension -AICD interrogated--noted concerning dysrhythmia, no VT -Personally reviewed chest x-ray--no pulmonary edema or infiltrates -no concerning dysrhythmias on tele  Acute kidney injury -Baseline creatinine 1.0-1.2 -Presented with serum creatinine 2.97 -Continue IV fluids>>resolved -serum creatinine 1.01 on day of d/c -Holding Entresto>>restart after d/c on 1/18 -improved with fluid resuscitation  Chronic systolic and diastolic CHF -02/05/2020 echo EF 30%, grade 2 DD -Holding Entresto, carvedilol,eplerenonesecondary to hypotension --instructed to restart coreg and entresto 08/30/20 and monitor BPs at home.  Call Dr. 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   dapagliflozin propanediol 10 MG Tabs tablet Commonly known as: Farxiga Take 1 tablet (10 mg total) by mouth daily before breakfast.   digoxin 0.125 MG tablet Commonly known as: LANOXIN Take 1 tablet (0.125 mg total) by mouth daily.   Entresto 97-103 MG Generic drug: sacubitril-valsartan TAKE 1 TABLET BY MOUTH TWICE DAILY   eplerenone 25 MG tablet Commonly known as: INSPRA Take 25 mg by mouth daily.   meclizine 25 MG tablet Commonly known as: ANTIVERT Take 0.5 tablets (12.5 mg total) by mouth 3 (three) times daily as needed for dizziness.   methocarbamol 500 MG tablet Commonly known as: ROBAXIN Take 1 tablet (500 mg total) by mouth every 8 (eight) hours as needed for muscle spasms.   ondansetron 8 MG disintegrating tablet Commonly known as: Zofran ODT Take 1 tablet (8 mg total) by mouth every 8 (eight) hours as needed for nausea.   sertraline 25 MG tablet Commonly known as: ZOLOFT Take 25 mg by mouth daily.       Allergies  Allergen Reactions   Spironolactone     Gynecomastia/breast tenderness    Consultations:  none   Procedures/Studies: CT ABDOMEN PELVIS WO CONTRAST  Result Date: 08/27/2020 CLINICAL DATA:  Right lower quadrant pain. EXAM: CT ABDOMEN AND PELVIS WITHOUT CONTRAST TECHNIQUE: Multidetector CT imaging of the abdomen and pelvis was performed following the standard protocol without IV contrast. COMPARISON:  October 08, 2019 FINDINGS: Lower chest: The lung bases are clear. The heart size is normal. Hepatobiliary: There is decreased hepatic attenuation suggestive of hepatic steatosis. Normal gallbladder.There is no biliary ductal dilation. Pancreas: Normal contours without ductal dilatation. No peripancreatic fluid  collection. Spleen: Unremarkable. Adrenals/Urinary Tract: --Adrenal glands: Unremarkable. --Right kidney/ureter: No hydronephrosis or radiopaque kidney stones. --Left kidney/ureter: No hydronephrosis or radiopaque kidney stones. --Urinary bladder: Unremarkable. Stomach/Bowel: --Stomach/Duodenum: No hiatal hernia or other gastric abnormality. Normal duodenal course and caliber. --Small bowel: Unremarkable. --Colon: There is liquid stool throughout the colon. --Appendix: Not visualized. No right lower quadrant inflammation or free fluid. Vascular/Lymphatic: Normal course and caliber of the major abdominal vessels. --No retroperitoneal lymphadenopathy. --No mesenteric lymphadenopathy. --No pelvic or inguinal lymphadenopathy. Reproductive: Unremarkable Other: No ascites or free air. There is a fat containing left inguinal hernia. There is a fat containing umbilical hernia. Musculoskeletal. No acute displaced fractures. IMPRESSION: 1. Liquid stool throughout the colon consistent with diarrheal illness. 2. Hepatic steatosis. 3. Fat containing left inguinal and umbilical hernias. Electronically Signed   By: Katherine Mantle M.D.   On: 08/27/2020 01:09   DG Chest 2 View  Result Date: 08/10/2020 CLINICAL DATA:  Cough and fever. EXAM: CHEST - 2 VIEW COMPARISON:  07/13/2020 FINDINGS: Single lead left-sided pacemaker in place.The cardiomediastinal contours are normal. The lungs are clear. Pulmonary vasculature is normal. No consolidation, pleural effusion, or pneumothorax. No acute osseous abnormalities are seen. IMPRESSION: No acute chest findings. Electronically Signed   By: Narda Rutherford M.D.   On: 08/10/2020 17:49   DG Chest Portable 1 View  Result Date: 08/26/2020 CLINICAL DATA:  Syncope EXAM: PORTABLE CHEST 1 VIEW COMPARISON:  08/10/2020 FINDINGS: Left-sided pacing device. No focal opacity or pleural effusion. Normal cardiomediastinal silhouette. No pneumothorax. IMPRESSION: No active disease. Electronically  Signed   By: Jasmine Pang M.D.   On: 08/26/2020 21:51   ECHOCARDIOGRAM COMPLETE  Result Date: 08/27/2020  ECHOCARDIOGRAM REPORT   Patient Name:   LYN DEEMER Date of Exam: 08/27/2020 Medical Rec #:  [id number]          Height:       72.0 in Accession #:    [id number]         Weight:       205.0 lb Date of Birth:  Sep 25, 1975          BSA:          2.153 m Patient Age:    44 years           BP:           123/72 mmHg Patient Gender: M                  HR:           53 bpm. [facility] Procedure: 2D Echo, Cardiac Doppler and Color Doppler Indications:    R55 Syncope  History:        Patient has prior history of Echocardiogram examinations, most                 recent 02/05/2020. Covid 19 positive.  Sonographer:    Roosvelt Maser Referring Phys: [id number] OLADAPO ADEFESO IMPRESSIONS  1. Left ventricular ejection fraction, by estimation, is 25 to 30%. The left ventricle has severely decreased function. The left ventricle demonstrates global hypokinesis. Left ventricular diastolic parameters are indeterminate.  2. Right ventricular systolic function is normal. The right ventricular size is normal. There is normal pulmonary artery systolic pressure.  3. The mitral valve is normal in structure. Trivial mitral valve regurgitation. No evidence of mitral stenosis.  4. The aortic valve is tricuspid. Aortic valve regurgitation is not visualized. No aortic stenosis is present.  5. The inferior vena cava is normal in size with greater than 50% respiratory variability, suggesting right atrial pressure of 3 mmHg. FINDINGS  Left Ventricle: Left ventricular ejection fraction, by estimation, is 25 to 30%. The left ventricle has severely decreased function. The left ventricle demonstrates global hypokinesis. The left ventricular internal cavity size was normal in size. There is no left ventricular hypertrophy. Left ventricular diastolic parameters are indeterminate. Right Ventricle: The right ventricular size is  normal.Right ventricular systolic function is normal. There is normal pulmonary artery systolic pressure. The tricuspid regurgitant velocity is 1.89 m/s, and with an assumed right atrial pressure of 3 mmHg, the estimated right ventricular systolic pressure is 17.3 mmHg. Left Atrium: Left atrial size was normal in size. Right Atrium: Right atrial size was normal in size. Pericardium: There is no evidence of pericardial effusion. Mitral Valve: The mitral valve is normal in structure. Trivial mitral valve regurgitation. No evidence of mitral valve stenosis. Tricuspid Valve: The tricuspid valve is normal in structure. Tricuspid valve regurgitation is trivial. No evidence of tricuspid stenosis. Aortic Valve: The aortic valve is tricuspid. Aortic valve regurgitation is not visualized. No aortic stenosis is present. Pulmonic Valve: The pulmonic valve was normal in structure. Pulmonic valve regurgitation is not visualized. No evidence of pulmonic stenosis. Aorta: The aortic root is normal in size and structure. Venous: The inferior vena cava is normal in size with greater than 50% respiratory variability, suggesting right atrial pressure of 3 mmHg.  Additional Comments: A pacer wire is visualized.  LEFT VENTRICLE PLAX 2D LVIDd:         5.23 cm      Diastology LVIDs:         4.55  cm      LV e' medial:    9.36 cm/s LV PW:         0.96 cm      LV E/e' medial:  6.6 LV IVS:        0.89 cm      LV e' lateral:   8.59 cm/s LVOT diam:     2.20 cm      LV E/e' lateral: 7.2 LV SV:         36 LV SV Index:   17 LVOT Area:     3.80 cm  LV Volumes (MOD) LV vol d, MOD A2C: 129.0 ml LV vol d, MOD A4C: 155.0 ml LV vol s, MOD A2C: 77.0 ml LV vol s, MOD A4C: 69.6 ml LV SV MOD A2C:     52.0 ml LV SV MOD A4C:     155.0 ml LV SV MOD BP:      68.7 ml RIGHT VENTRICLE RV Basal diam:  3.25 cm LEFT ATRIUM             Index       RIGHT ATRIUM           Index LA diam:        3.20 cm 1.49 cm/m  RA Area:     11.60 cm LA Vol (A2C):   45.9 ml 21.32 ml/m  RA Volume:   27.10 ml  12.59 ml/m LA Vol (A4C):   44.8 ml 20.81 ml/m LA Biplane Vol: 46.3 ml 21.50 ml/m  AORTIC VALVE LVOT Vmax:   49.00 cm/s LVOT Vmean:  35.500 cm/s LVOT VTI:    0.095 m  AORTA Ao Root diam: 3.70 cm Ao Asc diam:  2.70 cm MITRAL VALVE               TRICUSPID VALVE MV Area (PHT): 2.77 cm    TR Peak grad:   14.3 mmHg MV Decel Time: 274 msec    TR Vmax:        189.00 cm/s MV E velocity: 61.70 cm/s MV A velocity: 48.00 cm/s  SHUNTS MV E/A ratio:  1.29        Systemic VTI:  0.09 m                            Systemic Diam: 2.20 cm Olga Millers MD Electronically signed by Olga Millers MD Signature Date/Time: 08/27/2020/12:05:19 PM    Final         Discharge Exam: Vitals:   08/29/20 0742 08/29/20 1100  BP:    Pulse: (!) 57 60  Resp: 15 13  Temp:    SpO2: 94% 97%   Vitals:   08/29/20 0300 08/29/20 0400 08/29/20 0742 08/29/20 1100  BP:  127/83    Pulse: (!) 51 (!) 57 (!) 57 60  Resp:  12 15 13   Temp:  97.9 F (36.6 C)    TempSrc:  Oral    SpO2: 98% 96% 94% 97%  Weight:      Height:        General: Pt is alert, awake, not in acute distress Cardiovascular: RRR, S1/S2 +, no rubs, no gallops Respiratory: bibasilar rales. No wheeze Abdominal: Soft, NT, ND, bowel sounds + Extremities: no edema, no cyanosis   The results of significant diagnostics from this hospitalization (including imaging, microbiology, ancillary and laboratory) are listed below for reference.    Significant Diagnostic Studies: CT ABDOMEN PELVIS WO CONTRAST  Result Date: 08/27/2020 CLINICAL DATA:  Right lower quadrant pain. EXAM: CT ABDOMEN AND PELVIS WITHOUT CONTRAST TECHNIQUE: Multidetector CT imaging of the abdomen and pelvis was performed following the standard protocol without IV contrast. COMPARISON:  October 08, 2019 FINDINGS: Lower chest: The lung bases are clear. The heart size is normal. Hepatobiliary: There is decreased hepatic attenuation suggestive of hepatic steatosis. Normal  gallbladder.There is no biliary ductal dilation. Pancreas: Normal contours without ductal dilatation. No peripancreatic fluid collection. Spleen: Unremarkable. Adrenals/Urinary Tract: --Adrenal glands: Unremarkable. --Right kidney/ureter: No hydronephrosis or radiopaque kidney stones. --Left kidney/ureter: No hydronephrosis or radiopaque kidney stones. --Urinary bladder: Unremarkable. Stomach/Bowel: --Stomach/Duodenum: No hiatal hernia or other gastric abnormality. Normal duodenal course and caliber. --Small bowel: Unremarkable. --Colon: There is liquid stool throughout the colon. --Appendix: Not visualized. No right lower quadrant inflammation or free fluid. Vascular/Lymphatic: Normal course and caliber of the major abdominal vessels. --No retroperitoneal lymphadenopathy. --No mesenteric lymphadenopathy. --No pelvic or inguinal lymphadenopathy. Reproductive: Unremarkable Other: No ascites or free air. There is a fat containing left inguinal hernia. There is a fat containing umbilical hernia. Musculoskeletal. No acute displaced fractures. IMPRESSION: 1. Liquid stool throughout the colon consistent with diarrheal illness. 2. Hepatic steatosis. 3. Fat containing left inguinal and umbilical hernias. Electronically Signed   By: Katherine Mantle M.D.   On: 08/27/2020 01:09   DG Chest 2 View  Result Date: 08/10/2020 CLINICAL DATA:  Cough and fever. EXAM: CHEST - 2 VIEW COMPARISON:  07/13/2020 FINDINGS: Single lead left-sided pacemaker in place.The cardiomediastinal contours are normal. The lungs are clear. Pulmonary vasculature is normal. No consolidation, pleural effusion, or pneumothorax. No acute osseous abnormalities are seen. IMPRESSION: No acute chest findings. Electronically Signed   By: Narda Rutherford M.D.   On: 08/10/2020 17:49   DG Chest Portable 1 View  Result Date: 08/26/2020 CLINICAL DATA:  Syncope EXAM: PORTABLE CHEST 1 VIEW COMPARISON:  08/10/2020 FINDINGS: Left-sided pacing device. No focal  opacity or pleural effusion. Normal cardiomediastinal silhouette. No pneumothorax. IMPRESSION: No active disease. Electronically Signed   By: Jasmine Pang M.D.   On: 08/26/2020 21:51   ECHOCARDIOGRAM COMPLETE  Result Date: 08/27/2020    ECHOCARDIOGRAM REPORT   Patient Name:   Andrew Hinton Date of Exam: 08/27/2020 Medical Rec #:  [id number]          Height:       72.0 in Accession #:    [id number]         Weight:       205.0 lb Date of Birth:  10-22-1975          BSA:          2.153 m Patient Age:    44 years           BP:           123/72 mmHg Patient Gender: M                  HR:           53 bpm. [facility] Procedure: 2D Echo, Cardiac Doppler and Color Doppler Indications:    R55 Syncope  History:        Patient has prior history of Echocardiogram examinations, most                 recent 02/05/2020. Covid 19 positive.  Sonographer:    Roosvelt Maser Referring Phys: [id number] OLADAPO ADEFESO IMPRESSIONS  1. Left ventricular ejection fraction, by estimation, is 25 to  30%. The left ventricle has severely decreased function. The left ventricle demonstrates global hypokinesis. Left ventricular diastolic parameters are indeterminate.  2. Right ventricular systolic function is normal. The right ventricular size is normal. There is normal pulmonary artery systolic pressure.  3. The mitral valve is normal in structure. Trivial mitral valve regurgitation. No evidence of mitral stenosis.  4. The aortic valve is tricuspid. Aortic valve regurgitation is not visualized. No aortic stenosis is present.  5. The inferior vena cava is normal in size with greater than 50% respiratory variability, suggesting right atrial pressure of 3 mmHg. FINDINGS  Left Ventricle: Left ventricular ejection fraction, by estimation, is 25 to 30%. The left ventricle has severely decreased function. The left ventricle demonstrates global hypokinesis. The left ventricular internal cavity size was normal in size. There is no left  ventricular hypertrophy. Left ventricular diastolic parameters are indeterminate. Right Ventricle: The right ventricular size is normal.Right ventricular systolic function is normal. There is normal pulmonary artery systolic pressure. The tricuspid regurgitant velocity is 1.89 m/s, and with an assumed right atrial pressure of 3 mmHg, the estimated right ventricular systolic pressure is 17.3 mmHg. Left Atrium: Left atrial size was normal in size. Right Atrium: Right atrial size was normal in size. Pericardium: There is no evidence of pericardial effusion. Mitral Valve: The mitral valve is normal in structure. Trivial mitral valve regurgitation. No evidence of mitral valve stenosis. Tricuspid Valve: The tricuspid valve is normal in structure. Tricuspid valve regurgitation is trivial. No evidence of tricuspid stenosis. Aortic Valve: The aortic valve is tricuspid. Aortic valve regurgitation is not visualized. No aortic stenosis is present. Pulmonic Valve: The pulmonic valve was normal in structure. Pulmonic valve regurgitation is not visualized. No evidence of pulmonic stenosis. Aorta: The aortic root is normal in size and structure. Venous: The inferior vena cava is normal in size with greater than 50% respiratory variability, suggesting right atrial pressure of 3 mmHg.  Additional Comments: A pacer wire is visualized.  LEFT VENTRICLE PLAX 2D LVIDd:         5.23 cm      Diastology LVIDs:         4.55 cm      LV e' medial:    9.36 cm/s LV PW:         0.96 cm      LV E/e' medial:  6.6 LV IVS:        0.89 cm      LV e' lateral:   8.59 cm/s LVOT diam:     2.20 cm      LV E/e' lateral: 7.2 LV SV:         36 LV SV Index:   17 LVOT Area:     3.80 cm  LV Volumes (MOD) LV vol d, MOD A2C: 129.0 ml LV vol d, MOD A4C: 155.0 ml LV vol s, MOD A2C: 77.0 ml LV vol s, MOD A4C: 69.6 ml LV SV MOD A2C:     52.0 ml LV SV MOD A4C:     155.0 ml LV SV MOD BP:      68.7 ml RIGHT VENTRICLE RV Basal diam:  3.25 cm LEFT ATRIUM             Index        RIGHT ATRIUM           Index LA diam:        3.20 cm 1.49 cm/m  RA Area:     11.60 cm LA  Vol [facility](A2C):   45.9 ml 21.32 ml/m RA Volume:   27.10 ml  12.59 ml/m LA Vol (A4C):   44.8 ml 20.81 ml/m LA Biplane Vol: 46.3 ml 21.50 ml/m  AORTIC VALVE LVOT Vmax:   49.00 cm/s LVOT Vmean:  35.500 cm/s LVOT VTI:    0.095 m  AORTA Ao Root diam: 3.70 cm Ao Asc diam:  2.70 cm MITRAL VALVE               TRICUSPID VALVE MV Area (PHT): 2.77 cm    TR Peak grad:   14.3 mmHg MV Decel Time: 274 msec    TR Vmax:        189.00 cm/s MV E velocity: 61.70 cm/s MV A velocity: 48.00 cm/s  SHUNTS MV E/A ratio:  1.29        Systemic VTI:  0.09 m                            Systemic Diam: 2.20 cm Olga MillersBrian Crenshaw MD Electronically signed by Olga MillersBrian Crenshaw MD Signature Date/Time: 08/27/2020/12:05:19 PM    Final      Microbiology: Recent Results (from the past 240 hour(s))  Resp Panel by RT-PCR (Flu A&B, Covid) Nasopharyngeal Swab     Status: Abnormal   Collection Time: 08/26/20  9:16 PM   Specimen: Nasopharyngeal Swab; Nasopharyngeal(NP) swabs in vial transport medium  Result Value Ref Range Status   SARS Coronavirus 2 by RT PCR POSITIVE (A) NEGATIVE Final    Comment: RESULT CALLED TO, READ BACK BY AND VERIFIED WITH: TURNER,C @ 2314 ON 08/26/20 BY JUW (NOTE) SARS-CoV-2 target nucleic acids are DETECTED.  The SARS-CoV-2 RNA is generally detectable in upper respiratory specimens during the acute phase of infection. Positive results are indicative of the presence of the identified virus, but do not rule out bacterial infection or co-infection with other pathogens not detected by the test. Clinical correlation with patient history and other diagnostic information is necessary to determine patient infection status. The expected result is Negative.  Fact Sheet for Patients: BloggerCourse.comhttps://www.fda.gov/media/152166/download  Fact Sheet for Healthcare Providers: SeriousBroker.ithttps://www.fda.gov/media/152162/download  This test is not yet  approved or cleared by the [place] FDA and  has been authorized for detection and/or diagnosis of SARS-CoV-2 by FDA under an Emergency Use Authorization (EUA).  This EUA will remain in effect (meaning this test can be  used) for the duration of  the COVID-19 declaration under Section 564(b)(1) of the Act, 21 U.S.C. section 360bbb-3(b)(1), unless the authorization is terminated or revoked sooner.     Influenza A by PCR NEGATIVE NEGATIVE Final   Influenza B by PCR NEGATIVE NEGATIVE Final    Comment: (NOTE) The Xpert Xpress SARS-CoV-2/FLU/RSV plus assay is intended as an aid in the diagnosis of influenza from Nasopharyngeal swab specimens and should not be used as a sole basis for treatment. Nasal washings and aspirates are unacceptable for Xpert Xpress SARS-CoV-2/FLU/RSV testing.  Fact Sheet for Patients: BloggerCourse.comhttps://www.fda.gov/media/152166/download  Fact Sheet for Healthcare Providers: SeriousBroker.ithttps://www.fda.gov/media/152162/download  This test is not yet approved or cleared by the [place] FDA and has been authorized for detection and/or diagnosis of SARS-CoV-2 by FDA under an Emergency Use Authorization (EUA). This EUA will remain in effect (meaning this test can be used) for the duration of the COVID-19 declaration under Section 564(b)(1) of the Act, 21 U.S.C. section 360bbb-3(b)(1), unless the authorization is terminated or revoked.  Performed at [facility], [address] [place] [address]   C Difficile Quick Screen w PCR reflex     Status: None   Collection Time: 08/27/20  4:10 AM   Specimen: STOOL  Result Value Ref Range Status   C Diff antigen NEGATIVE NEGATIVE Final   C Diff toxin NEGATIVE NEGATIVE Final   C Diff interpretation No C. difficile detected.  Final    Comment: Performed at [facility], [address] [place], [place] [address]  Gastrointestinal Panel by PCR , Stool     Status: None   Collection Time: 08/27/20  4:10 AM   Specimen: STOOL   Result Value Ref Range Status   Campylobacter species NOT DETECTED NOT DETECTED Final   Plesimonas shigelloides NOT DETECTED NOT DETECTED Final   Salmonella species NOT DETECTED NOT DETECTED Final   Yersinia enterocolitica NOT DETECTED NOT DETECTED Final   Vibrio species NOT DETECTED NOT DETECTED Final   Vibrio cholerae NOT DETECTED NOT DETECTED Final   Enteroaggregative E coli (EAEC) NOT DETECTED NOT DETECTED Final   Enteropathogenic E coli (EPEC) NOT DETECTED NOT DETECTED Final   Enterotoxigenic E coli (ETEC) NOT DETECTED NOT DETECTED Final   Shiga like toxin producing E coli (STEC) NOT DETECTED NOT DETECTED Final   Shigella/Enteroinvasive E coli (EIEC) NOT DETECTED NOT DETECTED Final   Cryptosporidium NOT DETECTED NOT DETECTED Final   Cyclospora cayetanensis NOT DETECTED NOT DETECTED Final   Entamoeba histolytica NOT DETECTED NOT DETECTED Final   Giardia lamblia NOT DETECTED NOT DETECTED Final   Adenovirus F40/41 NOT DETECTED NOT DETECTED Final   Astrovirus NOT DETECTED NOT DETECTED Final   Norovirus GI/GII NOT DETECTED NOT DETECTED Final   Rotavirus A NOT DETECTED NOT DETECTED Final   Sapovirus (I, II, IV, and V) NOT DETECTED NOT DETECTED Final    Comment: Performed at [facility], [address] [place], [place] [address]  Culture, blood (Routine X 2) w Reflex to ID Panel     Status: None (Preliminary result)   Collection Time: 08/27/20  8:10 AM   Specimen: Right Antecubital; Blood  Result Value Ref Range Status   Specimen Description   Final    RIGHT ANTECUBITAL BOTTLES DRAWN AEROBIC AND ANAEROBIC   Special Requests Blood Culture adequate volume  Final   Culture   Final    NO GROWTH 2 DAYS Performed at [facility], [address], [place], [place] [address]    Report Status PENDING  Incomplete  Culture, blood (Routine X 2) w Reflex to ID Panel     Status: None (Preliminary result)   Collection Time: 08/27/20  8:10 AM   Specimen: BLOOD RIGHT ARM  Result Value  Ref Range Status   Specimen Description   Final    BLOOD RIGHT ARM BOTTLES DRAWN AEROBIC AND ANAEROBIC   Special Requests Blood Culture adequate volume  Final   Culture   Final    NO GROWTH 2 DAYS Performed at [facility], [address] [place], [place] [address]    Report Status PENDING  Incomplete  MRSA PCR Screening     Status: Abnormal   Collection Time: 08/27/20  3:25 PM   Specimen: Nasal Mucosa; Nasopharyngeal  Result Value Ref Range Status   MRSA by PCR POSITIVE (A) NEGATIVE Final    Comment:        The GeneXpert MRSA Assay (FDA approved for NASAL specimens only), is one component of a comprehensive MRSA colonization surveillance program. It is not intended to diagnose MRSA infection nor to guide or  monitor treatment for MRSA infections. RESULT CALLED TO, READ BACK BY AND VERIFIED WITH: PICKETT,J @ 0054 ON 08/28/20 BY JUW Performed at [facility], [address], [place], [place] [address]      Labs: Basic Metabolic Panel: Recent Labs  Lab 08/26/20 2115 08/27/20 0523 08/27/20 0810 08/28/20 0500 08/29/20 0320  NA 135  --  138 139 138  K 3.8  --  3.9 3.8 3.6  CL 104  --  111 107 105  CO2 20*  --  17* 24 27  GLUCOSE 84  --  87 95 98  BUN 30*  --  22* 12 11  CREATININE 2.97* 1.86* 1.65* 1.04 1.01  CALCIUM 8.7*  --  8.5* 8.7* 8.5*  MG  --   --   --  1.9 1.7  PHOS  --   --   --  2.3* 2.7   Liver Function Tests: Recent Labs  Lab 08/26/20 2115 08/28/20 0500 08/29/20 0320  AST 44* 35 39  ALT 40 39 41  ALKPHOS 52 50 42  BILITOT 0.6 0.4 0.6  PROT 7.8 6.7 6.2*  ALBUMIN 4.3 3.5 3.4*   Recent Labs  Lab 08/26/20 2115  LIPASE 46   No results for input(s): AMMONIA in the last 168 hours. CBC: Recent Labs  Lab 08/26/20 2115 08/27/20 0523 08/28/20 0500 08/29/20 0320  WBC 5.1 4.1 3.4* 3.6*  NEUTROABS 3.3  --  1.1* 0.9*  HGB 15.4 15.3 15.0 14.3  HCT 44.7 44.8 44.7 41.8  MCV 95.3 95.9 95.5 94.4  PLT 170 182 155 155   Cardiac Enzymes: No results for  input(s): CKTOTAL, CKMB, CKMBINDEX, TROPONINI in the last 168 hours. BNP: Invalid input(s): POCBNP CBG: Recent Labs  Lab 08/27/20 1700 08/27/20 2144 08/28/20 0901 08/28/20 1158 08/28/20 1541  GLUCAP 103* 92 106* 138* 96    Time coordinating discharge:  36 minutes  Signed:  Catarina Hartshorn, DO [facility] Pager: [phone] 08/29/2020, 11:47 AM

## 2020-08-28 NOTE — Progress Notes (Signed)
PROGRESS NOTE  Andrew Hinton ZOX:[id number] DOB: [birth year] DOA: 08/26/2020 PCP: [facility]  Brief History:  45-year-old male with a history of systolic CHF with EF 30%, ICD placement, subarachnoid hemorrhage February 2021, and depression presenting with 3-day history of diarrhea, coughing, and nasal congestion.  Patient also had some associated abdominal pain and generalized weakness.  He denies any fevers, chills, chest pain, shortness of breath, hemoptysis, nausea, vomiting.  The patient was getting up from the couch when he felt dizzy and had a syncopal episode lasting about 30 seconds.  There was no aura.  There was no loss of bowel or bladder.  He denied biting his tongue.  He denied any prior chest pain, shortness of breath, palpitations.  He has had 8-9 loose bowel movements on a daily basis for last 3 days.  There is no hematochezia or melena.  He had some upper abdominal pain.  There is no dysuria or hematuria.  He has had some nausea without emesis.  He has had decreased oral intake. In the emergency department, the patient was afebrile with soft blood pressures.  Oxygen saturation was 96% on room air.  WBC 5.1, hemoglobin 15.4, platelets 170,000.  BMP showed a serum creatinine of 2.97 which is above his usual baseline.  CT of the abdomen and pelvis showed liquid stool throughout the colon consistent with diarrhea.  There was hepatic steatosis.  There is fat-containing umbilical hernia.  There is no hydronephrosis.  Negative gallbladder  Assessment/Plan: COVID-19 gastroenteritis -Manifested by nausea and diarrhea -C. difficile negative -Stool pathogen panel--neg -08/27/2020-CT abd as discussed above -loperamide prn   Hypotension -Lactic acid peaked at 1.7>> 0.5 -Secondary to volume depletion -Holding Entresto and coreg -A.m. cortisol--14.6 -Check procalcitonin <0.10 -Blood cultures x2--neg to date -Switch fluids to LR -overall  improved  Syncope -Secondary to volume depletion and hypotension -AICD interrogated--noted concerning dysrhythmia, no VT -Personally reviewed chest x-ray--no pulmonary edema or infiltrates  Acute kidney injury -Baseline creatinine 1.0-1.2 -Presented with serum creatinine 2.97 -Continue IV fluids -Holding Entresto -improved with fluid resuscitation  Chronic systolic and diastolic CHF -02/05/2020 echo EF 30%, grade 2 DD -Holding Entresto, carvedilol, eplerenone secondary to hypotension -Holding Farxiga secondary to AKI temporarily -Continue digoxin  Depression -Continue Zoloft  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        Family Communication:   No Family at bedside  Consultants:  none  Code Status:  FULL   DVT ProphylaxisSC Lovenox   Procedures: As Listed in Progress Note Above  Antibiotics: None     Subjective: Patient is feeling better.  He denies cp, sob, n/v/d.  States he has had only 2 BM in past 24 hours, not watery anymore.  Denies f/c, headache.  Abd pain is improved.  He is tolerating diet.  No dysuria or hematuria.  No dizz  Objective: Vitals:   08/28/20 0930 08/28/20 1000 08/28/20 1200 08/28/20 1243  BP:    112/75  Pulse: (!) 57 63 63 71  Resp: 14 19 15 16   Temp:      TempSrc:      SpO2: 98% 98% 98% 97%  Weight:      Height:        Intake/Output Summary (Last 24 hours) at 08/28/2020 1250 Last data filed at 08/28/2020 0000 Gross per 24 hour  Intake 646.31 ml  Output 1225 ml  Net -578.69 ml   Weight change: -5.987 kg Exam:   General:  Pt is alert, follows commands  appropriately, not in acute distress  HEENT: No icterus, No thrush, No neck mass, [place]/AT  Cardiovascular: RRR, S1/S2, no rubs, no gallops  Respiratory: CTA bilaterally, no wheezing, no crackles, no rhonchi  Abdomen: Soft/+BS, non tender, non distended, no guarding  Extremities: No edema, No lymphangitis, No petechiae, No rashes, no synovitis   Data Reviewed: I have personally reviewed following labs and imaging studies Basic Metabolic Panel: Recent Labs  Lab 08/26/20 2115 08/27/20 0523 08/27/20 0810 08/28/20 0500  NA 135  --  138 139  K 3.8  --  3.9 3.8  CL 104  --  111 107  CO2 20*  --  17* 24  GLUCOSE 84  --  87 95  BUN 30*  --  22* 12  CREATININE 2.97* 1.86* 1.65* 1.04  CALCIUM 8.7*  --  8.5* 8.7*  MG  --   --   --  1.9  PHOS  --   --   --  2.3*   Liver Function Tests: Recent Labs  Lab 08/26/20 2115 08/28/20 0500  AST 44* 35  ALT 40 39  ALKPHOS 52 50  BILITOT 0.6 0.4  PROT 7.8 6.7  ALBUMIN 4.3 3.5   Recent Labs  Lab 08/26/20 2115  LIPASE 46   No results for input(s): AMMONIA in the last 168 hours. Coagulation Profile: No results for input(s): INR, PROTIME in the last 168 hours. CBC: Recent Labs  Lab 08/26/20 2115 08/27/20 0523 08/28/20 0500  WBC 5.1 4.1 3.4*  NEUTROABS 3.3  --  1.1*  HGB 15.4 15.3 15.0  HCT 44.7 44.8 44.7  MCV 95.3 95.9 95.5  PLT 170 182 155   Cardiac Enzymes: No results for input(s): CKTOTAL, CKMB, CKMBINDEX, TROPONINI in the last 168 hours. BNP: Invalid input(s): POCBNP CBG: Recent Labs  Lab 08/27/20 1225 08/27/20 1700 08/27/20 2144 08/28/20 0901 08/28/20 1158  GLUCAP 117* 103* 92 106* 138*   HbA1C: Recent Labs    08/27/20 0523  HGBA1C 5.1   Urine analysis:    Component Value Date/Time   COLORURINE YELLOW 08/25/2019 1421   APPEARANCEUR CLEAR 08/25/2019 1421   LABSPEC 1.023 08/25/2019 1421   PHURINE 5.0 08/25/2019 1421   GLUCOSEU NEGATIVE 08/25/2019 1421   HGBUR NEGATIVE 08/25/2019 1421   BILIRUBINUR  NEGATIVE 08/25/2019 1421   KETONESUR NEGATIVE 08/25/2019 1421   PROTEINUR 30 (A) 08/25/2019 1421   NITRITE NEGATIVE 08/25/2019 1421   LEUKOCYTESUR NEGATIVE 08/25/2019 1421   Sepsis Labs: @LABRCNTIP (procalcitonin:4,lacticidven:4) ) Recent Results (from the past 240 hour(s))  Resp Panel by RT-PCR (Flu A&B, Covid) Nasopharyngeal Swab     Status: Abnormal   Collection Time: 08/26/20  9:16 PM   Specimen: Nasopharyngeal Swab; Nasopharyngeal(NP) swabs in vial transport medium  Result Value Ref Range Status   SARS Coronavirus 2 by RT PCR POSITIVE (A) NEGATIVE Final    Comment: RESULT CALLED TO, READ BACK BY AND VERIFIED WITH: TURNER,C @ 2314 ON 08/26/20 BY JUW (NOTE) SARS-CoV-2 target nucleic acids are DETECTED.  The SARS-CoV-2 RNA is generally detectable in upper respiratory specimens during the acute phase of infection. Positive results are indicative of the presence of the identified virus,  but do not rule out bacterial infection or co-infection with other pathogens not detected by the test. Clinical correlation with patient history and other diagnostic information is necessary to determine patient infection status. The expected result is Negative.  Fact Sheet for Patients: BloggerCourse.com  Fact Sheet for Healthcare Providers: SeriousBroker.it  This test is not yet approved or cleared by the [place] FDA and  has been authorized for detection and/or diagnosis of SARS-CoV-2 by FDA under an Emergency Use Authorization (EUA).  This EUA will remain in effect (meaning this test can be  used) for the duration of  the COVID-19 declaration under Section 564(b)(1) of the Act, 21 U.S.C. section 360bbb-3(b)(1), unless the authorization is terminated or revoked sooner.     Influenza A by PCR NEGATIVE NEGATIVE Final   Influenza B by PCR NEGATIVE NEGATIVE Final    Comment: (NOTE) The Xpert Xpress SARS-CoV-2/FLU/RSV plus assay is  intended as an aid in the diagnosis of influenza from Nasopharyngeal swab specimens and should not be used as a sole basis for treatment. Nasal washings and aspirates are unacceptable for Xpert Xpress SARS-CoV-2/FLU/RSV testing.  Fact Sheet for Patients: BloggerCourse.com  Fact Sheet for Healthcare Providers: SeriousBroker.it  This test is not yet approved or cleared by the [place] FDA and has been authorized for detection and/or diagnosis of SARS-CoV-2 by FDA under an Emergency Use Authorization (EUA). This EUA will remain in effect (meaning this test can be used) for the duration of the COVID-19 declaration under Section 564(b)(1) of the Act, 21 U.S.C. section 360bbb-3(b)(1), unless the authorization is terminated or revoked.  Performed at [facility], [address] [place], [place] [address]   C Difficile Quick Screen w PCR reflex     Status: None   Collection Time: 08/27/20  4:10 AM   Specimen: STOOL  Result Value Ref Range Status   C Diff antigen NEGATIVE NEGATIVE Final   C Diff toxin NEGATIVE NEGATIVE Final   C Diff interpretation No C. difficile detected.  Final    Comment: Performed at [facility], [address] [place], [place] [address]  Gastrointestinal Panel by PCR , Stool     Status: None   Collection Time: 08/27/20  4:10 AM   Specimen: STOOL  Result Value Ref Range Status   Campylobacter species NOT DETECTED NOT DETECTED Final   Plesimonas shigelloides NOT DETECTED NOT DETECTED Final   Salmonella species NOT DETECTED NOT DETECTED Final   Yersinia enterocolitica NOT DETECTED NOT DETECTED Final   Vibrio species NOT DETECTED NOT DETECTED Final   Vibrio cholerae NOT DETECTED NOT DETECTED Final   Enteroaggregative E coli (EAEC) NOT DETECTED NOT DETECTED Final   Enteropathogenic E coli (EPEC) NOT DETECTED NOT DETECTED Final   Enterotoxigenic E coli (ETEC) NOT DETECTED NOT DETECTED Final   Shiga like toxin  producing E coli (STEC) NOT DETECTED NOT DETECTED Final   Shigella/Enteroinvasive E coli (EIEC) NOT DETECTED NOT DETECTED Final   Cryptosporidium NOT DETECTED NOT DETECTED Final   Cyclospora cayetanensis NOT DETECTED NOT DETECTED Final   Entamoeba histolytica NOT DETECTED NOT DETECTED Final   Giardia lamblia NOT DETECTED NOT DETECTED Final   Adenovirus F40/41 NOT DETECTED NOT DETECTED Final   Astrovirus NOT DETECTED NOT DETECTED Final   Norovirus GI/GII NOT DETECTED NOT DETECTED Final   Rotavirus A NOT DETECTED NOT DETECTED Final   Sapovirus (I, II, IV, and V) NOT DETECTED NOT DETECTED Final    Comment: Performed at [place] [facility], [address]  [place], [place] [address]  Culture, blood (Routine X 2) w Reflex to ID Panel     Status: None (Preliminary result)   Collection Time: 08/27/20  8:10 AM   Specimen: Right Antecubital; Blood  Result Value Ref Range Status   Specimen Description   Final    RIGHT ANTECUBITAL BOTTLES DRAWN AEROBIC AND ANAEROBIC   Special Requests Blood Culture adequate volume  Final   Culture   Final    NO GROWTH 1 DAY Performed at [facility], [address] [place], [place] [address]    Report Status PENDING  Incomplete  Culture, blood (Routine X 2) w Reflex to ID Panel     Status: None (Preliminary result)   Collection Time: 08/27/20  8:10 AM   Specimen: BLOOD RIGHT ARM  Result Value Ref Range Status   Specimen Description   Final    BLOOD RIGHT ARM BOTTLES DRAWN AEROBIC AND ANAEROBIC   Special Requests Blood Culture adequate volume  Final   Culture   Final    NO GROWTH 1 DAY Performed at [facility], [address] [place], [place] [address]    Report Status PENDING  Incomplete  MRSA PCR Screening     Status: Abnormal   Collection Time: 08/27/20  3:25 PM   Specimen: Nasal Mucosa; Nasopharyngeal  Result Value Ref Range Status   MRSA by PCR POSITIVE (A) NEGATIVE Final    Comment:        The GeneXpert MRSA Assay (FDA approved for NASAL  specimens only), is one component of a comprehensive MRSA colonization surveillance program. It is not intended to diagnose MRSA infection nor to guide or monitor treatment for MRSA infections. RESULT CALLED TO, READ BACK BY AND VERIFIED WITH: PICKETT,J @ 0054 ON 08/28/20 BY JUW Performed at [facility], [address], [place], [place] [address]      Scheduled Meds: . vitamin C  500 mg Oral Daily  . Chlorhexidine Gluconate Cloth  6 each Topical Daily  . Chlorhexidine Gluconate Cloth  6 each Topical Q0600  . digoxin  0.125 mg Oral Daily  . enoxaparin (LOVENOX) injection  40 mg Subcutaneous Q24H  . insulin aspart  0-5 Units Subcutaneous QHS  . insulin aspart  0-9 Units Subcutaneous TID WC  . mupirocin ointment  1 application Nasal BID  . zinc sulfate  220 mg Oral Daily   Continuous Infusions: . remdesivir 100 mg in NS 100 mL 100 mg (08/28/20 1013)    Procedures/Studies: CT ABDOMEN PELVIS WO CONTRAST  Result Date: 08/27/2020 CLINICAL DATA:  Right lower quadrant pain. EXAM: CT ABDOMEN AND PELVIS WITHOUT CONTRAST TECHNIQUE: Multidetector CT imaging of the abdomen and pelvis was performed following the standard protocol without IV contrast. COMPARISON:  October 08, 2019 FINDINGS: Lower chest: The lung bases are clear. The heart size is normal. Hepatobiliary: There is decreased hepatic attenuation suggestive of hepatic steatosis. Normal gallbladder.There is no biliary ductal dilation. Pancreas: Normal contours without ductal dilatation. No peripancreatic fluid collection. Spleen: Unremarkable. Adrenals/Urinary Tract: --Adrenal glands: Unremarkable. --Right kidney/ureter: No hydronephrosis or radiopaque kidney stones. --Left kidney/ureter: No hydronephrosis or radiopaque kidney stones. --Urinary bladder: Unremarkable. Stomach/Bowel: --Stomach/Duodenum: No hiatal hernia or other gastric abnormality. Normal duodenal course and caliber. --Small bowel: Unremarkable. --Colon: There is liquid  stool throughout the colon. --Appendix: Not visualized. No right lower quadrant inflammation or free fluid. Vascular/Lymphatic: Normal course and caliber of the major abdominal vessels. --No retroperitoneal lymphadenopathy. --No mesenteric lymphadenopathy. --No pelvic or inguinal lymphadenopathy. Reproductive: Unremarkable Other: No ascites  or free air. There is a fat containing left inguinal hernia. There is a fat containing umbilical hernia. Musculoskeletal. No acute displaced fractures. IMPRESSION: 1. Liquid stool throughout the colon consistent with diarrheal illness. 2. Hepatic steatosis. 3. Fat containing left inguinal and umbilical hernias. Electronically Signed   By: Katherine Mantle M.D.   On: 08/27/2020 01:09   DG Chest 2 View  Result Date: 08/10/2020 CLINICAL DATA:  Cough and fever. EXAM: CHEST - 2 VIEW COMPARISON:  07/13/2020 FINDINGS: Single lead left-sided pacemaker in place.The cardiomediastinal contours are normal. The lungs are clear. Pulmonary vasculature is normal. No consolidation, pleural effusion, or pneumothorax. No acute osseous abnormalities are seen. IMPRESSION: No acute chest findings. Electronically Signed   By: Narda Rutherford M.D.   On: 08/10/2020 17:49   DG Chest Portable 1 View  Result Date: 08/26/2020 CLINICAL DATA:  Syncope EXAM: PORTABLE CHEST 1 VIEW COMPARISON:  08/10/2020 FINDINGS: Left-sided pacing device. No focal opacity or pleural effusion. Normal cardiomediastinal silhouette. No pneumothorax. IMPRESSION: No active disease. Electronically Signed   By: Jasmine Pang M.D.   On: 08/26/2020 21:51   ECHOCARDIOGRAM COMPLETE  Result Date: 08/27/2020    ECHOCARDIOGRAM REPORT   Patient Name:   AVINASH MALTOS Date of Exam: 08/27/2020 Medical Rec #:  [id number]          Height:       72.0 in Accession #:    [id number]         Weight:       205.0 lb Date of Birth:  1976-02-10          BSA:          2.153 m Patient Age:    44 years           BP:           123/72 mmHg  Patient Gender: M                  HR:           53 bpm. [facility] Procedure: 2D Echo, Cardiac Doppler and Color Doppler Indications:    R55 Syncope  History:        Patient has prior history of Echocardiogram examinations, most                 recent 02/05/2020. Covid 19 positive.  Sonographer:    Roosvelt Maser Referring Phys: [id number] OLADAPO ADEFESO IMPRESSIONS  1. Left ventricular ejection fraction, by estimation, is 25 to 30%. The left ventricle has severely decreased function. The left ventricle demonstrates global hypokinesis. Left ventricular diastolic parameters are indeterminate.  2. Right ventricular systolic function is normal. The right ventricular size is normal. There is normal pulmonary artery systolic pressure.  3. The mitral valve is normal in structure. Trivial mitral valve regurgitation. No evidence of mitral stenosis.  4. The aortic valve is tricuspid. Aortic valve regurgitation is not visualized. No aortic stenosis is present.  5. The inferior vena cava is normal in size with greater than 50% respiratory variability, suggesting right atrial pressure of 3 mmHg. FINDINGS  Left Ventricle: Left ventricular ejection fraction, by estimation, is 25 to 30%. The left ventricle has severely decreased function. The left ventricle demonstrates global hypokinesis. The left ventricular internal cavity size was normal in size. There is no left ventricular hypertrophy. Left ventricular diastolic parameters are indeterminate. Right Ventricle: The right ventricular size is normal.Right ventricular systolic function is normal. There is normal pulmonary artery systolic pressure.  The tricuspid regurgitant velocity is 1.89 m/s, and with an assumed right atrial pressure of 3 mmHg, the estimated right ventricular systolic pressure is 17.3 mmHg. Left Atrium: Left atrial size was normal in size. Right Atrium: Right atrial size was normal in size. Pericardium: There is no evidence of pericardial effusion.  Mitral Valve: The mitral valve is normal in structure. Trivial mitral valve regurgitation. No evidence of mitral valve stenosis. Tricuspid Valve: The tricuspid valve is normal in structure. Tricuspid valve regurgitation is trivial. No evidence of tricuspid stenosis. Aortic Valve: The aortic valve is tricuspid. Aortic valve regurgitation is not visualized. No aortic stenosis is present. Pulmonic Valve: The pulmonic valve was normal in structure. Pulmonic valve regurgitation is not visualized. No evidence of pulmonic stenosis. Aorta: The aortic root is normal in size and structure. Venous: The inferior vena cava is normal in size with greater than 50% respiratory variability, suggesting right atrial pressure of 3 mmHg.  Additional Comments: A pacer wire is visualized.  LEFT VENTRICLE PLAX 2D LVIDd:         5.23 cm      Diastology LVIDs:         4.55 cm      LV e' medial:    9.36 cm/s LV PW:         0.96 cm      LV E/e' medial:  6.6 LV IVS:        0.89 cm      LV e' lateral:   8.59 cm/s LVOT diam:     2.20 cm      LV E/e' lateral: 7.2 LV SV:         36 LV SV Index:   17 LVOT Area:     3.80 cm  LV Volumes (MOD) LV vol d, MOD A2C: 129.0 ml LV vol d, MOD A4C: 155.0 ml LV vol s, MOD A2C: 77.0 ml LV vol s, MOD A4C: 69.6 ml LV SV MOD A2C:     52.0 ml LV SV MOD A4C:     155.0 ml LV SV MOD BP:      68.7 ml RIGHT VENTRICLE RV Basal diam:  3.25 cm LEFT ATRIUM             Index       RIGHT ATRIUM           Index LA diam:        3.20 cm 1.49 cm/m  RA Area:     11.60 cm LA Vol (A2C):   45.9 ml 21.32 ml/m RA Volume:   27.10 ml  12.59 ml/m LA Vol (A4C):   44.8 ml 20.81 ml/m LA Biplane Vol: 46.3 ml 21.50 ml/m  AORTIC VALVE LVOT Vmax:   49.00 cm/s LVOT Vmean:  35.500 cm/s LVOT VTI:    0.095 m  AORTA Ao Root diam: 3.70 cm Ao Asc diam:  2.70 cm MITRAL VALVE               TRICUSPID VALVE MV Area (PHT): 2.77 cm    TR Peak grad:   14.3 mmHg MV Decel Time: 274 msec    TR Vmax:        189.00 cm/s MV E velocity: 61.70 cm/s MV A  velocity: 48.00 cm/s  SHUNTS MV E/A ratio:  1.29        Systemic VTI:  0.09 m  Systemic Diam: 2.20 cm Olga Millers MD Electronically signed by Olga Millers MD Signature Date/Time: 08/27/2020/12:05:19 PM    Final     Catarina Hartshorn, DO  [facility]  If 7PM-7AM, please contact night-coverage [email or web address] Password [email or web address] 08/28/2020, 12:50 PM   LOS: 1 day

## 2020-08-29 DIAGNOSIS — N179 Acute kidney failure, unspecified: Secondary | ICD-10-CM | POA: Diagnosis not present

## 2020-08-29 DIAGNOSIS — E861 Hypovolemia: Secondary | ICD-10-CM

## 2020-08-29 DIAGNOSIS — R55 Syncope and collapse: Secondary | ICD-10-CM | POA: Diagnosis not present

## 2020-08-29 DIAGNOSIS — U071 COVID-19: Secondary | ICD-10-CM | POA: Diagnosis not present

## 2020-08-29 DIAGNOSIS — I5042 Chronic combined systolic (congestive) and diastolic (congestive) heart failure: Secondary | ICD-10-CM | POA: Diagnosis not present

## 2020-08-29 DIAGNOSIS — I9589 Other hypotension: Secondary | ICD-10-CM

## 2020-08-29 LAB — CBC WITH DIFFERENTIAL/PLATELET
Abs Immature Granulocytes: 0 10*3/uL (ref 0.00–0.07)
Basophils Absolute: 0 10*3/uL (ref 0.0–0.1)
Basophils Relative: 1 %
Eosinophils Absolute: 0.4 10*3/uL (ref 0.0–0.5)
Eosinophils Relative: 11 %
HCT: 41.8 % (ref 39.0–52.0)
Hemoglobin: 14.3 g/dL (ref 13.0–17.0)
Immature Granulocytes: 0 %
Lymphocytes Relative: 45 %
Lymphs Abs: 1.7 10*3/uL (ref 0.7–4.0)
MCH: 32.3 pg (ref 26.0–34.0)
MCHC: 34.2 g/dL (ref 30.0–36.0)
MCV: 94.4 fL (ref 80.0–100.0)
Monocytes Absolute: 0.6 10*3/uL (ref 0.1–1.0)
Monocytes Relative: 17 %
Neutro Abs: 0.9 10*3/uL — ABNORMAL LOW (ref 1.7–7.7)
Neutrophils Relative %: 26 %
Platelets: 155 10*3/uL (ref 150–400)
RBC: 4.43 MIL/uL (ref 4.22–5.81)
RDW: 12.3 % (ref 11.5–15.5)
WBC: 3.6 10*3/uL — ABNORMAL LOW (ref 4.0–10.5)
nRBC: 0 % (ref 0.0–0.2)

## 2020-08-29 LAB — COMPREHENSIVE METABOLIC PANEL
ALT: 41 U/L (ref 0–44)
AST: 39 U/L (ref 15–41)
Albumin: 3.4 g/dL — ABNORMAL LOW (ref 3.5–5.0)
Alkaline Phosphatase: 42 U/L (ref 38–126)
Anion gap: 6 (ref 5–15)
BUN: 11 mg/dL (ref 6–20)
CO2: 27 mmol/L (ref 22–32)
Calcium: 8.5 mg/dL — ABNORMAL LOW (ref 8.9–10.3)
Chloride: 105 mmol/L (ref 98–111)
Creatinine, Ser: 1.01 mg/dL (ref 0.61–1.24)
GFR, Estimated: >60 mL/min (ref >60)
Glucose, Bld: 98 mg/dL (ref 70–99)
Potassium: 3.6 mmol/L (ref 3.5–5.1)
Sodium: 138 mmol/L (ref 135–145)
Total Bilirubin: 0.6 mg/dL (ref 0.3–1.2)
Total Protein: 6.2 g/dL — ABNORMAL LOW (ref 6.5–8.1)

## 2020-08-29 LAB — PROCALCITONIN: Procalcitonin: <0.1 ng/mL

## 2020-08-29 LAB — D-DIMER, QUANTITATIVE: D-Dimer, Quant: 0.47 ug/mL-FEU (ref 0.00–0.50)

## 2020-08-29 LAB — FERRITIN: Ferritin: 226 ng/mL (ref 24–336)

## 2020-08-29 LAB — MAGNESIUM: Magnesium: 1.7 mg/dL (ref 1.7–2.4)

## 2020-08-29 LAB — PHOSPHORUS: Phosphorus: 2.7 mg/dL (ref 2.5–4.6)

## 2020-08-29 LAB — C-REACTIVE PROTEIN: CRP: <0.5 mg/dL (ref <1.0)

## 2020-09-01 LAB — CULTURE, BLOOD (ROUTINE X 2)
Culture: NO GROWTH
Culture: NO GROWTH
Special Requests: ADEQUATE
Special Requests: ADEQUATE

## 2020-09-10 ENCOUNTER — Other Ambulatory Visit: Payer: Self-pay | Admitting: Internal Medicine

## 2020-09-20 ENCOUNTER — Ambulatory Visit (INDEPENDENT_AMBULATORY_CARE_PROVIDER_SITE_OTHER): Payer: Medicaid Other

## 2020-09-20 DIAGNOSIS — I5022 Chronic systolic (congestive) heart failure: Secondary | ICD-10-CM

## 2020-09-20 LAB — CUP PACEART REMOTE DEVICE CHECK
Battery Remaining Longevity: 180 mo
Battery Remaining Percentage: 100 %
Brady Statistic RV Percent Paced: 0 %
Date Time Interrogation Session: 20220208003100
HighPow Impedance: 83 Ohm
Implantable Lead Implant Date: 20210810
Implantable Lead Location: 753860
Implantable Lead Model: 273
Implantable Lead Serial Number: 111079
Implantable Pulse Generator Implant Date: 20210810
Lead Channel Impedance Value: 549 Ohm
Lead Channel Setting Pacing Amplitude: 2.5 V
Lead Channel Setting Pacing Pulse Width: 0.4 ms
Lead Channel Setting Sensing Sensitivity: 0.5 mV
Pulse Gen Serial Number: 278671

## 2020-09-26 NOTE — Progress Notes (Signed)
Remote ICD transmission.   

## 2020-09-30 ENCOUNTER — Encounter (HOSPITAL_COMMUNITY): Payer: Medicaid Other | Admitting: Cardiology

## 2020-10-14 ENCOUNTER — Other Ambulatory Visit (HOSPITAL_COMMUNITY): Payer: Self-pay | Admitting: Cardiology

## 2020-10-14 ENCOUNTER — Other Ambulatory Visit (HOSPITAL_COMMUNITY): Payer: Self-pay | Admitting: *Deleted

## 2020-10-14 MED ORDER — EPLERENONE 25 MG PO TABS: 25.0000 mg | ORAL_TABLET | Freq: Every day | ORAL | 6 refills | Status: DC

## 2020-10-20 ENCOUNTER — Telehealth (HOSPITAL_COMMUNITY): Payer: Self-pay | Admitting: Pharmacy Technician

## 2020-10-20 NOTE — Telephone Encounter (Signed)
Received a notification that it is time to renew patient assistance for [facility]. Patient has a Designer, [employer]. Current co-pay is $0. Will not seek re-enrollment at this time.  Archer Asa, CPhT

## 2020-12-20 ENCOUNTER — Ambulatory Visit (INDEPENDENT_AMBULATORY_CARE_PROVIDER_SITE_OTHER): Payer: Medicaid Other

## 2020-12-20 DIAGNOSIS — I428 Other cardiomyopathies: Secondary | ICD-10-CM | POA: Diagnosis not present

## 2020-12-27 LAB — CUP PACEART REMOTE DEVICE CHECK
Battery Remaining Longevity: 180 mo
Battery Remaining Percentage: 100 %
Brady Statistic RV Percent Paced: 0 %
Date Time Interrogation Session: 20220515161900
HighPow Impedance: 87 Ohm
Implantable Lead Implant Date: 20210810
Implantable Lead Location: 753860
Implantable Lead Model: 273
Implantable Lead Serial Number: 111079
Implantable Pulse Generator Implant Date: 20210810
Lead Channel Impedance Value: 539 Ohm
Lead Channel Setting Pacing Amplitude: 2.5 V
Lead Channel Setting Pacing Pulse Width: 0.4 ms
Lead Channel Setting Sensing Sensitivity: 0.5 mV
Pulse Gen Serial Number: 278671

## 2021-01-12 NOTE — Progress Notes (Signed)
Remote ICD transmission.   

## 2021-03-09 ENCOUNTER — Emergency Department (HOSPITAL_COMMUNITY)
Admission: EM | Admit: 2021-03-09 | Discharge: 2021-03-09 | Disposition: A | Discharge status: Home / Self Care | Payer: Medicaid Other | Attending: Emergency Medicine | Admitting: Emergency Medicine

## 2021-03-09 ENCOUNTER — Other Ambulatory Visit: Payer: Self-pay

## 2021-03-09 ENCOUNTER — Encounter (HOSPITAL_COMMUNITY): Payer: Self-pay | Admitting: *Deleted

## 2021-03-09 ENCOUNTER — Emergency Department (HOSPITAL_COMMUNITY): Payer: Medicaid Other

## 2021-03-09 DIAGNOSIS — I5042 Chronic combined systolic (congestive) and diastolic (congestive) heart failure: Secondary | ICD-10-CM | POA: Insufficient documentation

## 2021-03-09 DIAGNOSIS — Z87891 Personal history of nicotine dependence: Secondary | ICD-10-CM | POA: Diagnosis not present

## 2021-03-09 DIAGNOSIS — Z79899 Other long term (current) drug therapy: Secondary | ICD-10-CM | POA: Insufficient documentation

## 2021-03-09 DIAGNOSIS — Z8616 Personal history of COVID-19: Secondary | ICD-10-CM | POA: Diagnosis not present

## 2021-03-09 DIAGNOSIS — J45909 Unspecified asthma, uncomplicated: Secondary | ICD-10-CM | POA: Diagnosis not present

## 2021-03-09 DIAGNOSIS — Z7984 Long term (current) use of oral hypoglycemic drugs: Secondary | ICD-10-CM | POA: Diagnosis not present

## 2021-03-09 DIAGNOSIS — Z95810 Presence of automatic (implantable) cardiac defibrillator: Secondary | ICD-10-CM | POA: Diagnosis not present

## 2021-03-09 DIAGNOSIS — R079 Chest pain, unspecified: Secondary | ICD-10-CM

## 2021-03-09 LAB — BASIC METABOLIC PANEL
Anion gap: 8 (ref 5–15)
BUN: 21 mg/dL — ABNORMAL HIGH (ref 6–20)
CO2: 26 mmol/L (ref 22–32)
Calcium: 9.4 mg/dL (ref 8.9–10.3)
Chloride: 105 mmol/L (ref 98–111)
Creatinine, Ser: 1.01 mg/dL (ref 0.61–1.24)
GFR, Estimated: >60 mL/min (ref >60)
Glucose, Bld: 101 mg/dL — ABNORMAL HIGH (ref 70–99)
Potassium: 4.5 mmol/L (ref 3.5–5.1)
Sodium: 139 mmol/L (ref 135–145)

## 2021-03-09 LAB — CBC
HCT: 44.7 % (ref 39.0–52.0)
Hemoglobin: 14.9 g/dL (ref 13.0–17.0)
MCH: 32.5 pg (ref 26.0–34.0)
MCHC: 33.3 g/dL (ref 30.0–36.0)
MCV: 97.6 fL (ref 80.0–100.0)
Platelets: 353 10*3/uL (ref 150–400)
RBC: 4.58 MIL/uL (ref 4.22–5.81)
RDW: 12 % (ref 11.5–15.5)
WBC: 6.5 10*3/uL (ref 4.0–10.5)
nRBC: 0 % (ref 0.0–0.2)

## 2021-03-09 LAB — DIFFERENTIAL
Abs Immature Granulocytes: 0 10*3/uL (ref 0.00–0.07)
Basophils Absolute: 0 10*3/uL (ref 0.0–0.1)
Basophils Relative: 1 %
Eosinophils Absolute: 0.1 10*3/uL (ref 0.0–0.5)
Eosinophils Relative: 5 %
Immature Granulocytes: 1 %
Lymphocytes Relative: 50 %
Lymphs Abs: 1.1 10*3/uL (ref 0.7–4.0)
Monocytes Absolute: 0.2 10*3/uL (ref 0.1–1.0)
Monocytes Relative: 8 %
Neutro Abs: 0.8 10*3/uL — ABNORMAL LOW (ref 1.7–7.7)
Neutrophils Relative %: 37 %
nRBC: 0 /100 WBC

## 2021-03-09 LAB — TROPONIN I (HIGH SENSITIVITY)
Troponin I (High Sensitivity): 7 ng/L (ref <18)
Troponin I (High Sensitivity): 7 ng/L (ref <18)

## 2021-03-09 MED ORDER — ONDANSETRON 8 MG PO TBDP
8.0000 mg | ORAL_TABLET | Freq: Once | ORAL | Status: AC
  Administered 2021-03-09: 8 mg via ORAL
  Filled 2021-03-09: qty 1

## 2021-03-09 MED ORDER — OXYCODONE-ACETAMINOPHEN 5-325 MG PO TABS
1.0000 | ORAL_TABLET | Freq: Once | ORAL | Status: AC
  Administered 2021-03-09: 1 via ORAL
  Filled 2021-03-09: qty 1

## 2021-03-09 NOTE — Discharge Instructions (Signed)
Take Tylenol and Motrin as needed for pain. Please follow-up with your cardiologist early next week for reevaluation. If things change or worsen, specifically features to feel different types of pain, vomiting, shortness of breath, significant sweating, loss of consciousness please return back to the ED for further evaluation.

## 2021-03-09 NOTE — ED Provider Notes (Signed)
[facility] EMERGENCY DEPARTMENT Provider Note   CSN: [id number] Arrival date & time: 03/09/21  1129     History No chief complaint on file.   Andrew Hinton is a 45 y.o. male.  HPI  Patient presents with chest pain x3 days.  Pain is intermittent, feels like a sharp stabbing pain to the chest.  It is minimally alleviated with ibuprofen, it radiates to the left arm.  No radiation to the back.  He denies any nausea or vomiting, no history of previous heart attacks.  He does have an ICD, last time he saw his cardiologist was 4 months ago.  Does not have any associated shortness of breath.  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  Pain is aggravated by movement.  Past Medical History:  Diagnosis Date  . Anxiety   . Asthma   . Depression   . Nerve damage     Patient Active Problem List   Diagnosis Date Noted  . COVID-19 virus infection 08/27/2020  . Acute diarrhea 08/27/2020  . Dehydration 08/27/2020  . Hepatic steatosis 08/27/2020  . Umbilical hernia 08/27/2020  . Left inguinal hernia 08/27/2020  . Gastroenteritis due to COVID-19 virus 08/27/2020  . Chronic combined systolic and diastolic heart failure (HCC) 08/27/2020  . Hypotension 08/27/2020  . ICD (implantable cardioverter-defibrillator) in place 07/05/2020  . Chronic systolic heart failure (HCC) 03/16/2020  . Syncope 10/09/2019  . AKI (acute kidney injury) (HCC) 10/09/2019  . Acute systolic CHF (congestive heart failure) (HCC) 10/09/2019  . Subarachnoid hemorrhage (HCC) 10/08/2019    Past Surgical History:  Procedure Laterality Date  . APPENDECTOMY    . ICD IMPLANT N/A 03/22/2020   Procedure: ICD IMPLANT;  Surgeon: Marinus Maw, MD;  Location: [facility] INVASIVE CV LAB;  Service: Cardiovascular;  Laterality: N/A;  . RIGHT/LEFT HEART CATH AND CORONARY ANGIOGRAPHY N/A 10/12/2019   Procedure: RIGHT/LEFT HEART CATH AND CORONARY ANGIOGRAPHY;  Surgeon: Laurey Morale, MD;   Location: [facility] INVASIVE CV LAB;  Service: Cardiovascular;  Laterality: N/A;       No family history on file.  Social History   Tobacco Use  . Smoking status: Former    Packs/day: 0.50    Types: Cigarettes    Quit date: 10/07/2017    Years since quitting: 3.4  . Smokeless tobacco: Never  Vaping Use  . Vaping Use: Never used  Substance Use Topics  . Alcohol use: Yes    Comment: occasionally   . Drug use: No    Home Medications Prior to Admission medications   Medication Sig Start Date End Date Taking? Authorizing Provider  carvedilol (COREG) 6.25 MG tablet TAKE 1 AND 1/2 TABLETS(9.375 MG) BY MOUTH TWICE DAILY WITH A MEAL 10/14/20   Laurey Morale, MD  dapagliflozin propanediol (FARXIGA) 10 MG TABS tablet Take 1 tablet (10 mg total) by mouth daily before breakfast. 02/11/20   Laurey Morale, MD  digoxin (LANOXIN) 0.125 MG tablet Take 1 tablet (0.125 mg total) by mouth daily. 02/11/20   Laurey Morale, MD  ENTRESTO 97-103 MG TAKE 1 TABLET BY MOUTH TWICE DAILY 08/10/20   Laurey Morale, MD  eplerenone (INSPRA) 25 MG tablet Take 1 tablet (25 mg total) by mouth daily. 10/14/20   Laurey Morale, MD  meclizine (ANTIVERT) 25 MG tablet Take 0.5 tablets (12.5 mg total) by mouth 3 (three) times daily as needed for dizziness. 11/10/19   Robbie Lis M, PA-C  methocarbamol (ROBAXIN) 500 MG  tablet Take 1 tablet (500 mg total) by mouth every 8 (eight) hours as needed for muscle spasms. 07/13/20   Sabas Sous, MD  ondansetron (ZOFRAN ODT) 8 MG disintegrating tablet Take 1 tablet (8 mg total) by mouth every 8 (eight) hours as needed for nausea. 08/10/20   Derwood Kaplan, MD  sertraline (ZOLOFT) 25 MG tablet Take 25 mg by mouth daily. 07/19/20   [provider]    Allergies    Spironolactone  Review of Systems   Review of Systems  Constitutional:  Negative for chills and fever.  HENT:  Negative for ear pain and sore throat.   Eyes:  Negative for pain and visual disturbance.   Respiratory:  Negative for cough and shortness of breath.   Cardiovascular:  Positive for chest pain. Negative for palpitations and leg swelling.  Gastrointestinal:  Negative for abdominal pain and vomiting.  Genitourinary:  Negative for dysuria and hematuria.  Musculoskeletal:  Negative for arthralgias and back pain.  Skin:  Negative for color change and rash.  Neurological:  Positive for numbness. Negative for seizures and syncope.  All other systems reviewed and are negative.  Physical Exam Updated Vital Signs BP (!) 146/115 (BP Location: Right Arm)   Pulse 80   Temp 98.3 F (36.8 C) (Oral)   Resp 20   Ht 6' (1.829 m)   Wt 90.7 kg   SpO2 98%   BMI 27.12 kg/m   Physical Exam Vitals and nursing note reviewed. Exam conducted with a chaperone present.  Constitutional:      Appearance: Normal appearance.  HENT:     Head: Normocephalic and atraumatic.  Eyes:     General: No scleral icterus.       Right eye: No discharge.        Left eye: No discharge.     Extraocular Movements: Extraocular movements intact.     Pupils: Pupils are equal, round, and reactive to light.  Cardiovascular:     Rate and Rhythm: Normal rate and regular rhythm.     Pulses: Normal pulses.     Heart sounds: Normal heart sounds. No murmur heard.   No friction rub. No gallop.     Comments: No murmurs rubs or gallops. Reproducible chest pain with palpation  Pulmonary:     Effort: Pulmonary effort is normal. No respiratory distress.     Breath sounds: Normal breath sounds.  Abdominal:     General: Abdomen is flat. Bowel sounds are normal. There is no distension.     Palpations: Abdomen is soft.     Tenderness: There is no abdominal tenderness.  Skin:    General: Skin is warm and dry.     Coloration: Skin is not jaundiced.  Neurological:     Mental Status: He is alert. Mental status is at baseline.     Coordination: Coordination normal.    ED Results / Procedures / Treatments   Labs (all labs  ordered are listed, but only abnormal results are displayed) Labs Reviewed  CBC  BASIC METABOLIC PANEL  DIFFERENTIAL  TROPONIN I (HIGH SENSITIVITY)  TROPONIN I (HIGH SENSITIVITY)    EKG None  Radiology DG Chest 2 View  Result Date: 03/09/2021 CLINICAL DATA:  Chest pain, left arm numbness EXAM: CHEST - 2 VIEW COMPARISON:  08/26/2020 FINDINGS: Left AICD remains in place, unchanged. Heart and mediastinal contours are within normal limits. No focal opacities or effusions. No acute bony abnormality. IMPRESSION: No active cardiopulmonary disease. Electronically Signed   By:  Charlett Nose M.D.   On: 03/09/2021 12:29    Procedures Procedures   Medications Ordered in ED Medications - No data to display  ED Course  I have reviewed the triage vital signs and the nursing notes.  Pertinent labs & imaging results that were available during my care of the patient were reviewed by me and considered in my medical decision making (see chart for details).  Clinical Course as of 03/09/21 1516  Thu Mar 09, 2021  1319 DG Chest 2 View No pneumonia, no cardiomegaly, no pleural effusion [HS]  1319 CBC No leukocytosis, no anemia [HS]  1512 Basic metabolic panel(!) Electrolyte derangement, no AKI [HS]  1512 Differential(!) No left shift, no leukocytosis [HS]  1513 ED EKG Patient is a normal sinus rhythm, no tachycardia or signs of arrhythmia.  No ST changes compared to previous EKG. [HS]  1513 Troponin I (High Sensitivity) Delta troponin reassuring, no significant elevation.  This combined with exam and other history makes me doubt ACS. [HS]    Clinical Course User Index [HS] Theron Arista, PA-C   MDM Rules/Calculators/A&P                           Labs and images were personally reviewed by me.  Additional chart history was reviewed.  Patient's vitals are stable, he is mildly hypertensive but otherwise unremarkable.  Physical exam does show reproducible chest wall pain, but given his cardiac  history I will order troponin and EKG as well as basic labs.  Will treat pain and reassess.  Last cath radiation was done in March 2021, although it showed low ejection fracture there were no signs of coronary artery narrowing.    Work-up thus far has been reassuring, will get a second troponin to check for trending signs of ACS.  However, pain has improved with medicine.  I suspect this pain is likely more due to muscle strain than actual cardiac etiology given that it is worsened by movement and reproducible on exam.  Additionally, lab work-up is been reassuring and I do not suspect emergent pathology at this time.  I advised him to follow-up with his cardiologist next week, strict return precautions were discussed and agreed upon.  Final Clinical Impression(s) / ED Diagnoses Final diagnoses:  None    Rx / DC Orders ED Discharge Orders     None        Theron Arista, [place] 03/09/21 1517    Bethann Berkshire, MD 03/13/21 1025

## 2021-03-09 NOTE — ED Triage Notes (Signed)
Chest pain x 3 days, has a implanted pacemaker defibrillator x 1 year due to low ejection fraction

## 2021-03-09 NOTE — ED Provider Notes (Signed)
Patient is aEmergency Medicine Provider Triage Evaluation Note  Braxston Quinter , a 45 y.o. male  was evaluated in triage.  Pt complains of chest pain x3 days.  Pain is intermittent, feels like a sharp stabbing pain, radiates to the left arm.  States his left arm also feels numb.  He has tried ibuprofen without relief.    Review of Systems  Positive: Chest pain, radiates to left arm Negative: Shortness of breath, n/v  Physical Exam  BP (!) 146/115 (BP Location: Right Arm)   Pulse 80   Temp 98.3 F (36.8 C) (Oral)   Resp 20   Ht 6' (1.829 m)   Wt 90.7 kg   SpO2 98%   BMI 27.12 kg/m  Gen:   Awake, no distress   Resp:  Normal effort  MSK:   Moves extremities without difficulty  Other:    Medical Decision Making  Medically screening exam initiated at 12:09 PM.  Appropriate orders placed.  Makaveli Hoard was informed that the remainder of the evaluation will be completed by another provider, this initial triage assessment does not replace that evaluation, and the importance of remaining in the ED until their evaluation is complete.     Theron Arista, PA-C 03/09/21 1210    Bethann Berkshire, MD 03/13/21 1024

## 2021-03-21 ENCOUNTER — Ambulatory Visit (INDEPENDENT_AMBULATORY_CARE_PROVIDER_SITE_OTHER): Payer: Medicaid Other

## 2021-03-21 DIAGNOSIS — I428 Other cardiomyopathies: Secondary | ICD-10-CM

## 2021-03-21 LAB — CUP PACEART REMOTE DEVICE CHECK
Battery Remaining Longevity: 180 mo
Battery Remaining Percentage: 100 %
Brady Statistic RV Percent Paced: 0 %
Date Time Interrogation Session: 20220809003100
HighPow Impedance: 93 Ohm
Implantable Lead Implant Date: 20210810
Implantable Lead Location: 753860
Implantable Lead Model: 273
Implantable Lead Serial Number: 111079
Implantable Pulse Generator Implant Date: 20210810
Lead Channel Impedance Value: 529 Ohm
Lead Channel Setting Pacing Amplitude: 2.5 V
Lead Channel Setting Pacing Pulse Width: 0.4 ms
Lead Channel Setting Sensing Sensitivity: 0.5 mV
Pulse Gen Serial Number: 278671

## 2021-03-28 ENCOUNTER — Emergency Department (HOSPITAL_COMMUNITY)
Admission: EM | Admit: 2021-03-28 | Discharge: 2021-03-28 | Disposition: A | Discharge status: Home / Self Care | Payer: Medicaid Other | Attending: Emergency Medicine | Admitting: Emergency Medicine

## 2021-03-28 ENCOUNTER — Emergency Department (HOSPITAL_COMMUNITY): Payer: Medicaid Other

## 2021-03-28 ENCOUNTER — Encounter (HOSPITAL_COMMUNITY): Payer: Self-pay | Admitting: Emergency Medicine

## 2021-03-28 ENCOUNTER — Other Ambulatory Visit: Payer: Self-pay

## 2021-03-28 DIAGNOSIS — Z8616 Personal history of COVID-19: Secondary | ICD-10-CM | POA: Insufficient documentation

## 2021-03-28 DIAGNOSIS — I5042 Chronic combined systolic (congestive) and diastolic (congestive) heart failure: Secondary | ICD-10-CM | POA: Insufficient documentation

## 2021-03-28 DIAGNOSIS — Z95810 Presence of automatic (implantable) cardiac defibrillator: Secondary | ICD-10-CM | POA: Diagnosis not present

## 2021-03-28 DIAGNOSIS — S40012A Contusion of left shoulder, initial encounter: Secondary | ICD-10-CM | POA: Insufficient documentation

## 2021-03-28 DIAGNOSIS — Y9389 Activity, other specified: Secondary | ICD-10-CM | POA: Insufficient documentation

## 2021-03-28 DIAGNOSIS — Z87891 Personal history of nicotine dependence: Secondary | ICD-10-CM | POA: Insufficient documentation

## 2021-03-28 DIAGNOSIS — R0789 Other chest pain: Secondary | ICD-10-CM | POA: Diagnosis not present

## 2021-03-28 DIAGNOSIS — S4992XA Unspecified injury of left shoulder and upper arm, initial encounter: Secondary | ICD-10-CM | POA: Diagnosis present

## 2021-03-28 DIAGNOSIS — J45909 Unspecified asthma, uncomplicated: Secondary | ICD-10-CM | POA: Insufficient documentation

## 2021-03-28 DIAGNOSIS — Z955 Presence of coronary angioplasty implant and graft: Secondary | ICD-10-CM | POA: Diagnosis not present

## 2021-03-28 DIAGNOSIS — S20212A Contusion of left front wall of thorax, initial encounter: Secondary | ICD-10-CM

## 2021-03-28 DIAGNOSIS — S46912A Strain of unspecified muscle, fascia and tendon at shoulder and upper arm level, left arm, initial encounter: Secondary | ICD-10-CM

## 2021-03-28 MED ORDER — HYDROCODONE-ACETAMINOPHEN 5-325 MG PO TABS: ORAL_TABLET | ORAL | 0 refills | Status: DC

## 2021-03-28 MED ORDER — OXYCODONE-ACETAMINOPHEN 5-325 MG PO TABS
1.0000 | ORAL_TABLET | Freq: Once | ORAL | Status: AC
  Administered 2021-03-28: 1 via ORAL
  Filled 2021-03-28: qty 1

## 2021-03-28 NOTE — Discharge Instructions (Signed)
Wear the sling for support of your arm.  Apply ice packs on and off to your shoulder.  Continue ibuprofen but only take 800 mg 3 times a day with food.  Call the orthopedic provider listed to arrange follow-up appointment.  Return emergency department for any new or worsening symptoms.

## 2021-03-28 NOTE — ED Provider Notes (Signed)
[facility] EMERGENCY DEPARTMENT Provider Note   CSN: [id number] Arrival date & time: 03/28/21  1308     History Chief Complaint  Patient presents with   Shoulder Injury    Andrew Hinton is a 45 y.o. male.   Shoulder Injury Pertinent negatives include no chest pain, no abdominal pain, no headaches and no shortness of breath.       Andrew Hinton is a 45 y.o. male with past medical history of systolic CHF, subarachnoid hemorrhage and ICD placed to the left chest approximately 1 year ago, he  presents to the Emergency Department complaining of left shoulder pain x3 days.  He states that he was working on a vehicle and wedged his left shoulder against a transmission trying to reach a bolt.  He was in this position for approximately 20 minutes and unable to pull his arm out.  He was able to remove his arm after his son tried the transmission with a pry bar.  He describes having persistent pain and pins and needle sensation of his shoulder upper left chest and left fingertips since the onset began.  Has taken over-the-counter ibuprofen without improvement.  He has constant pain, but it worsens with movement.  He denies swelling, discoloration, decreased sensation of his fingertips. No shortness of breath.  He is concerned that he may have damaged his ICD   Past Medical History:  Diagnosis Date   Anxiety    Asthma    Depression    Nerve damage     Patient Active Problem List   Diagnosis Date Noted   COVID-19 virus infection 08/27/2020   Acute diarrhea 08/27/2020   Dehydration 08/27/2020   Hepatic steatosis 08/27/2020   Umbilical hernia 08/27/2020   Left inguinal hernia 08/27/2020   Gastroenteritis due to COVID-19 virus 08/27/2020   Chronic combined systolic and diastolic heart failure (HCC) 08/27/2020   Hypotension 08/27/2020   ICD (implantable cardioverter-defibrillator) in place 07/05/2020   Chronic systolic heart failure (HCC) 03/16/2020   Syncope 10/09/2019    AKI (acute kidney injury) (HCC) 10/09/2019   Acute systolic CHF (congestive heart failure) (HCC) 10/09/2019   Subarachnoid hemorrhage (HCC) 10/08/2019    Past Surgical History:  Procedure Laterality Date   APPENDECTOMY     ICD IMPLANT N/A 03/22/2020   Procedure: ICD IMPLANT;  Surgeon: Marinus Maw, MD;  Location: [facility] INVASIVE CV LAB;  Service: Cardiovascular;  Laterality: N/A;   RIGHT/LEFT HEART CATH AND CORONARY ANGIOGRAPHY N/A 10/12/2019   Procedure: RIGHT/LEFT HEART CATH AND CORONARY ANGIOGRAPHY;  Surgeon: Laurey Morale, MD;  Location: [facility] INVASIVE CV LAB;  Service: Cardiovascular;  Laterality: N/A;       History reviewed. No pertinent family history.  Social History   Tobacco Use   Smoking status: Former    Packs/day: 0.50    Types: Cigarettes    Quit date: 10/07/2017    Years since quitting: 3.4   Smokeless tobacco: Never  Vaping Use   Vaping Use: Never used  Substance Use Topics   Alcohol use: Yes    Comment: occasionally    Drug use: No    Home Medications Prior to Admission medications   Medication Sig Start Date End Date Taking? Authorizing Provider  carvedilol (COREG) 6.25 MG tablet TAKE 1 AND 1/2 TABLETS(9.375 MG) BY MOUTH TWICE DAILY WITH A MEAL 10/14/20   Laurey Morale, MD  dapagliflozin propanediol (FARXIGA) 10 MG TABS tablet Take 1 tablet (10 mg total) by mouth daily before breakfast. 02/11/20   Shirlee Latch,  Eliot Ford, MD  digoxin (LANOXIN) 0.125 MG tablet Take 1 tablet (0.125 mg total) by mouth daily. 02/11/20   Laurey Morale, MD  ENTRESTO 97-103 MG TAKE 1 TABLET BY MOUTH TWICE DAILY 08/10/20   Laurey Morale, MD  eplerenone (INSPRA) 25 MG tablet Take 1 tablet (25 mg total) by mouth daily. 10/14/20   Laurey Morale, MD  meclizine (ANTIVERT) 25 MG tablet Take 0.5 tablets (12.5 mg total) by mouth 3 (three) times daily as needed for dizziness. 11/10/19   Robbie Lis M, PA-C  methocarbamol (ROBAXIN) 500 MG tablet Take 1 tablet (500 mg total) by mouth every 8  (eight) hours as needed for muscle spasms. 07/13/20   Sabas Sous, MD  ondansetron (ZOFRAN ODT) 8 MG disintegrating tablet Take 1 tablet (8 mg total) by mouth every 8 (eight) hours as needed for nausea. 08/10/20   Derwood Kaplan, MD  sertraline (ZOLOFT) 25 MG tablet Take 25 mg by mouth daily. 07/19/20   [provider]    Allergies    Spironolactone  Review of Systems   Review of Systems  Constitutional:  Negative for chills, fatigue and fever.  Respiratory:  Negative for shortness of breath.   Cardiovascular:  Negative for chest pain.  Gastrointestinal:  Negative for abdominal pain, nausea and vomiting.  Genitourinary:  Negative for dysuria, flank pain and hematuria.  Musculoskeletal:  Positive for arthralgias (left shoulder pain). Negative for back pain, myalgias, neck pain and neck stiffness.  Skin:  Negative for rash.  Neurological:  Negative for dizziness, weakness, numbness and headaches.  Hematological:  Does not bruise/bleed easily.   Physical Exam Updated Vital Signs BP (!) 145/107 (BP Location: Right Arm)   Pulse 82   Temp 98.2 F (36.8 C) (Oral)   Resp 17   Ht 6' (1.829 m)   Wt 90.7 kg   SpO2 98%   BMI 27.12 kg/m   Physical Exam Constitutional:      Appearance: Normal appearance. He is not ill-appearing or toxic-appearing.  HENT:     Head: Atraumatic.  Cardiovascular:     Rate and Rhythm: Normal rate and regular rhythm.     Pulses: Normal pulses.  Pulmonary:     Effort: Pulmonary effort is normal. No respiratory distress.     Breath sounds: Normal breath sounds.  Chest:     Chest wall: Tenderness (Mild tenderness to palpation of the left upper chest wall.  Palpable implanted device to the left upper chest as well.  Mild bruising noted.  No crepitus.) present.  Abdominal:     Palpations: Abdomen is soft.     Tenderness: There is no abdominal tenderness. There is no guarding or rebound.  Musculoskeletal:        General: Tenderness and signs of  injury present.     Cervical back: Normal range of motion and neck supple. Tenderness (Mild tenderness to palpation of the left cervical paraspinal muscles.) present.     Right lower leg: No edema.     Left lower leg: No edema.     Comments: Tender to palpation over the anterior left shoulder joint.  Mild ecchymosis noted.  No bony deformity.  Compartments are soft.  Skin:    General: Skin is warm.     Capillary Refill: Capillary refill takes less than 2 seconds.     Findings: No rash.  Neurological:     General: No focal deficit present.     Mental Status: He is alert.  Sensory: No sensory deficit.     Motor: No weakness.    ED Results / Procedures / Treatments   Labs (all labs ordered are listed, but only abnormal results are displayed) Labs Reviewed - No data to display  EKG None  Radiology DG Chest 2 View  Result Date: 03/28/2021 CLINICAL DATA:  Left arm pain after injury. EXAM: CHEST - 2 VIEW COMPARISON:  March 09, 2021. FINDINGS: The heart size and mediastinal contours are within normal limits. Both lungs are clear. Single lead left-sided pacemaker is unchanged in position. The visualized skeletal structures are unremarkable. IMPRESSION: No active cardiopulmonary disease. Electronically Signed   By: Lupita Raider M.D.   On: 03/28/2021 14:12   DG Shoulder Left  Result Date: 03/28/2021 CLINICAL DATA:  Injury.  Pain.  Status post ICD placement EXAM: LEFT SHOULDER - 2+ VIEW COMPARISON:  None. FINDINGS: Left chest wall ICD is identified. There is no evidence of fracture or dislocation. There is no evidence of arthropathy or other focal bone abnormality. Soft tissues are unremarkable. IMPRESSION: No acute abnormality. Electronically Signed   By: Signa Kell M.D.   On: 03/28/2021 14:09    Procedures Procedures   Medications Ordered in ED Medications  oxyCODONE-acetaminophen (PERCOCET/ROXICET) 5-325 MG per tablet 1 tablet (has no administration in time range)    ED  Course  I have reviewed the triage vital signs and the nursing notes.  Pertinent labs & imaging results that were available during my care of the patient were reviewed by me and considered in my medical decision making (see chart for details).    MDM Rules/Calculators/A&P                           Patient here for evaluation of shoulder injury that occurred 3 days ago.  He describes a wedge type injury to his shoulder while working on a vehicle.  Intermittent paresthesias of the left arm and shoulder joint.  No fall.  On exam, patient has diffuse tenderness to the anterior left shoulder and left upper chest wall.  Ecchymosis is present.  No palpable bony deformities or crepitus.  Neurovascularly intact good cap refill.  Apartments are soft.  X-rays negative for acute bony injury.  Symptoms are felt to be musculoskeletal.  Patient has been taking ibuprofen without relief.  His ICD appears appropriately located.  Plan includes rest, elevation, and ice.  I will provide short course of pain medication and he is agreeable to close outpatient follow-up with orthopedics.  Return precautions were discussed.  Final Clinical Impression(s) / ED Diagnoses Final diagnoses:  None    Rx / DC Orders ED Discharge Orders     None        Pauline Aus, PA-C 03/28/21 1504    Bethann Berkshire, MD 03/29/21 [phone]

## 2021-03-28 NOTE — ED Triage Notes (Signed)
Pt states Saturday he was working on car and got his left arm stuck in the car causing pain to left arm and shoulder. Pt also concerned due to having ICD implanted about 1 yr ago.

## 2021-03-29 ENCOUNTER — Telehealth: Payer: Self-pay | Admitting: Orthopedic Surgery

## 2021-03-29 NOTE — Telephone Encounter (Signed)
(  11:51am today) Reached voice mail, left message for patient offering appointment in response to his voice message received. Awaiting call back.

## 2021-03-29 NOTE — Telephone Encounter (Signed)
Patient returned call / aware of the appointment scheduled with Dr Dallas Schimke.

## 2021-04-04 ENCOUNTER — Other Ambulatory Visit: Payer: Self-pay

## 2021-04-04 ENCOUNTER — Ambulatory Visit (INDEPENDENT_AMBULATORY_CARE_PROVIDER_SITE_OTHER): Payer: Medicaid Other | Admitting: Orthopedic Surgery

## 2021-04-04 ENCOUNTER — Encounter: Payer: Self-pay | Admitting: Orthopedic Surgery

## 2021-04-04 VITALS — BP 159/101 | HR 86 | Ht 72.0 in | Wt 200.0 lb

## 2021-04-04 DIAGNOSIS — M7582 Other shoulder lesions, left shoulder: Secondary | ICD-10-CM

## 2021-04-04 MED ORDER — MELOXICAM 15 MG PO TABS: 15.0000 mg | ORAL_TABLET | Freq: Every day | ORAL | 0 refills | Status: DC

## 2021-04-04 MED ORDER — METHOCARBAMOL 500 MG PO TABS: 500.0000 mg | ORAL_TABLET | Freq: Three times a day (TID) | ORAL | 0 refills | Status: DC | PRN

## 2021-04-04 MED ORDER — DICLOFENAC SODIUM 50 MG PO TBEC
50.0000 mg | DELAYED_RELEASE_TABLET | Freq: Two times a day (BID) | ORAL | 0 refills | Status: DC
Start: 2021-04-04 — End: 2021-04-04

## 2021-04-04 NOTE — Progress Notes (Signed)
New Patient Visit  Assessment: Andrew Hinton is a 45 y.o. RHD male with the following: Left rotator cuff tendinitis; concern for rotator cuff injury  Plan: Patient is pain in his left shoulder, and the mechanism and resulting disability concerning for an injury to his rotator cuff tendons.  He has limited range of motion.  His pain is worse at night.  He does have some pain radiating distally.  I have refilled his methocarbamol, provided him with a prescription for meloxicam, to initiate immediately.  We also discussed the possibility of proceeding with a steroid injection, and he is interested in proceeding.  Depending on his response to the steroid injection, we could consider an MRI for further evaluation of the left shoulder.  All questions were answered and he is amenable to this plan.   Procedure note injection Left shoulder    Verbal consent was obtained to inject the left shoulder, subacromial space Timeout was completed to confirm the site of injection.  The skin was prepped with alcohol and ethyl chloride was sprayed at the injection site.  A 21-gauge needle was used to inject 40 mg of Depo-Medrol and 1% lidocaine (3 cc) into the subacromial space of the left shoulder using a posterolateral approach.  There were no complications. A sterile bandage was applied.    Follow-up: Return if symptoms worsen or fail to improve.  Subjective:  Chief Complaint  Patient presents with   Shoulder Pain    Left/ was working on a car last Saturday and got "stuck " reaching for a bolt. Has had pain since     History of Present Illness: Andrew Hinton is a 45 y.o. male who presents for evaluation of left shoulder pain.  Few days ago, he was working on a car, when his left arm essentially got stuck in the engine.  He was able to extricate himself from the injury, but in the process, the recoil injured his left shoulder.  He presented to the emergency department, where x-rays were  negative.  He has been taking some Norco for pain, as well as methocarbamol.  He is no longer taking the Norco.  He requires a refill of the methocarbamol.  Pain is located in the lateral aspect of his shoulder.  He is also having pain radiating into his neck.  He has difficulty with overhead motion.  Pain is worse at night.  It radiates distally.   Review of Systems: No fevers or chills No numbness or tingling No chest pain No shortness of breath No bowel or bladder dysfunction No GI distress No headaches   Medical History:  Past Medical History:  Diagnosis Date   Anxiety    Asthma    Depression    Nerve damage     Past Surgical History:  Procedure Laterality Date   APPENDECTOMY     ICD IMPLANT N/A 03/22/2020   Procedure: ICD IMPLANT;  Surgeon: Marinus Maw, MD;  Location: [facility] INVASIVE CV LAB;  Service: Cardiovascular;  Laterality: N/A;   RIGHT/LEFT HEART CATH AND CORONARY ANGIOGRAPHY N/A 10/12/2019   Procedure: RIGHT/LEFT HEART CATH AND CORONARY ANGIOGRAPHY;  Surgeon: Laurey Morale, MD;  Location: [facility] INVASIVE CV LAB;  Service: Cardiovascular;  Laterality: N/A;    History reviewed. No pertinent family history. Social History   Tobacco Use   Smoking status: Former    Packs/day: 0.50    Types: Cigarettes    Quit date: 10/07/2017    Years since quitting: 3.4   Smokeless tobacco: Never  Vaping Use   Vaping Use: Never used  Substance Use Topics   Alcohol use: Yes    Comment: occasionally    Drug use: No    Allergies  Allergen Reactions   Spironolactone     Gynecomastia/breast tenderness    Current Meds  Medication Sig   carvedilol (COREG) 6.25 MG tablet TAKE 1 AND 1/2 TABLETS(9.375 MG) BY MOUTH TWICE DAILY WITH A MEAL   dapagliflozin propanediol (FARXIGA) 10 MG TABS tablet Take 1 tablet (10 mg total) by mouth daily before breakfast.   digoxin (LANOXIN) 0.125 MG tablet Take 1 tablet (0.125 mg total) by mouth daily.   ENTRESTO 97-103 MG TAKE 1 TABLET BY MOUTH  TWICE DAILY   meclizine (ANTIVERT) 25 MG tablet Take 0.5 tablets (12.5 mg total) by mouth 3 (three) times daily as needed for dizziness.   meloxicam (MOBIC) 15 MG tablet Take 1 tablet (15 mg total) by mouth daily.   ondansetron (ZOFRAN ODT) 8 MG disintegrating tablet Take 1 tablet (8 mg total) by mouth every 8 (eight) hours as needed for nausea.   sertraline (ZOLOFT) 25 MG tablet Take 25 mg by mouth daily.   [DISCONTINUED] diclofenac (VOLTAREN) 50 MG EC tablet Take 1 tablet (50 mg total) by mouth 2 (two) times daily.   [DISCONTINUED] methocarbamol (ROBAXIN) 500 MG tablet Take 1 tablet (500 mg total) by mouth every 8 (eight) hours as needed for muscle spasms.    Objective: BP (!) 159/101   Pulse 86   Ht 6' (1.829 m)   Wt 200 lb (90.7 kg)   BMI 27.12 kg/m   Physical Exam:  General: Alert and oriented. and No acute distress. Gait: Normal gait.  Evaluation left shoulder demonstrates no deformity.  No atrophy is appreciated.  He has exquisite tenderness to palpation within the trapezius muscle.  Forward flexion 90 degrees.  Abduction to 75 degrees.  Passively, he has full forward flexion.  Some pain with drop arm testing.  Pain in the empty can testing position.  Negative O'Brien's.  Negative belly press.  Fingers are warm and well-perfused.  2+ radial pulse.  IMAGING: I personally reviewed images previously obtained from the ED  X-ray of the left shoulder was previously obtained and demonstrates no acute injury.  Well-maintained joint spaces.  New Medications:  Meds ordered this encounter  Medications   methocarbamol (ROBAXIN) 500 MG tablet    Sig: Take 1 tablet (500 mg total) by mouth every 8 (eight) hours as needed for muscle spasms.    Dispense:  30 tablet    Refill:  0   DISCONTD: diclofenac (VOLTAREN) 50 MG EC tablet    Sig: Take 1 tablet (50 mg total) by mouth 2 (two) times daily.    Dispense:  60 tablet    Refill:  0   meloxicam (MOBIC) 15 MG tablet    Sig: Take 1 tablet  (15 mg total) by mouth daily.    Dispense:  30 tablet    Refill:  0      Oliver Barre, MD  04/04/2021 3:30 PM

## 2021-04-04 NOTE — Patient Instructions (Signed)

## 2021-04-13 NOTE — Progress Notes (Signed)
Remote ICD transmission.   

## 2021-05-02 ENCOUNTER — Ambulatory Visit (HOSPITAL_COMMUNITY)
Admission: RE | Admit: 2021-05-02 | Discharge: 2021-05-02 | Disposition: A | Discharge status: Home / Self Care | Payer: Medicaid Other | Source: Ambulatory Visit | Attending: Cardiology | Admitting: Cardiology

## 2021-05-02 ENCOUNTER — Other Ambulatory Visit: Payer: Self-pay

## 2021-05-02 ENCOUNTER — Other Ambulatory Visit (HOSPITAL_COMMUNITY): Payer: Self-pay | Admitting: Cardiology

## 2021-05-02 ENCOUNTER — Encounter (HOSPITAL_COMMUNITY): Payer: Self-pay | Admitting: Cardiology

## 2021-05-02 VITALS — BP 92/58 | HR 77 | Wt 193.6 lb

## 2021-05-02 DIAGNOSIS — I5042 Chronic combined systolic (congestive) and diastolic (congestive) heart failure: Secondary | ICD-10-CM

## 2021-05-02 DIAGNOSIS — M25512 Pain in left shoulder: Secondary | ICD-10-CM | POA: Insufficient documentation

## 2021-05-02 DIAGNOSIS — Z79899 Other long term (current) drug therapy: Secondary | ICD-10-CM | POA: Insufficient documentation

## 2021-05-02 DIAGNOSIS — I428 Other cardiomyopathies: Secondary | ICD-10-CM | POA: Diagnosis not present

## 2021-05-02 DIAGNOSIS — F419 Anxiety disorder, unspecified: Secondary | ICD-10-CM | POA: Diagnosis not present

## 2021-05-02 DIAGNOSIS — I5022 Chronic systolic (congestive) heart failure: Secondary | ICD-10-CM

## 2021-05-02 DIAGNOSIS — F1010 Alcohol abuse, uncomplicated: Secondary | ICD-10-CM

## 2021-05-02 DIAGNOSIS — I609 Nontraumatic subarachnoid hemorrhage, unspecified: Secondary | ICD-10-CM | POA: Diagnosis not present

## 2021-05-02 DIAGNOSIS — Z8616 Personal history of COVID-19: Secondary | ICD-10-CM | POA: Insufficient documentation

## 2021-05-02 DIAGNOSIS — I38 Endocarditis, valve unspecified: Secondary | ICD-10-CM

## 2021-05-02 DIAGNOSIS — Z09 Encounter for follow-up examination after completed treatment for conditions other than malignant neoplasm: Secondary | ICD-10-CM | POA: Diagnosis not present

## 2021-05-02 DIAGNOSIS — F32A Depression, unspecified: Secondary | ICD-10-CM | POA: Insufficient documentation

## 2021-05-02 DIAGNOSIS — F1011 Alcohol abuse, in remission: Secondary | ICD-10-CM | POA: Diagnosis not present

## 2021-05-02 DIAGNOSIS — M75102 Unspecified rotator cuff tear or rupture of left shoulder, not specified as traumatic: Secondary | ICD-10-CM | POA: Diagnosis not present

## 2021-05-02 DIAGNOSIS — Z7901 Long term (current) use of anticoagulants: Secondary | ICD-10-CM | POA: Insufficient documentation

## 2021-05-02 DIAGNOSIS — Z7984 Long term (current) use of oral hypoglycemic drugs: Secondary | ICD-10-CM | POA: Diagnosis not present

## 2021-05-02 DIAGNOSIS — Z791 Long term (current) use of non-steroidal anti-inflammatories (NSAID): Secondary | ICD-10-CM | POA: Diagnosis not present

## 2021-05-02 LAB — DIGOXIN LEVEL: Digoxin Level: 0.3 ng/mL — ABNORMAL LOW (ref 0.8–2.0)

## 2021-05-02 LAB — BASIC METABOLIC PANEL
Anion gap: 8 (ref 5–15)
BUN: 16 mg/dL (ref 6–20)
CO2: 24 mmol/L (ref 22–32)
Calcium: 9.4 mg/dL (ref 8.9–10.3)
Chloride: 105 mmol/L (ref 98–111)
Creatinine, Ser: 0.95 mg/dL (ref 0.61–1.24)
GFR, Estimated: >60 mL/min (ref >60)
Glucose, Bld: 96 mg/dL (ref 70–99)
Potassium: 4 mmol/L (ref 3.5–5.1)
Sodium: 137 mmol/L (ref 135–145)

## 2021-05-02 MED ORDER — EPLERENONE 50 MG PO TABS: 50.0000 mg | ORAL_TABLET | Freq: Every day | ORAL | 3 refills | Status: DC

## 2021-05-02 NOTE — Progress Notes (Signed)
PCP: [facility] Primary Cardiologist: Dr Shirlee Latch   HPI: [name] is a 45 y.o. with history of anxiety, asthma, and depression. Previously a heavy drinker and smoker.  No longer has driver's license due to multiple DWIs.    Admitted 10/08/19 after syncopal episode and dyspnea with exertion. Had small SAH and rib fracture. Had echo that showed severely reduced EF, new onset acute systolic heart failure. Cardiac cath was negative for coronary disease, and showed low filling pressures and adequate cardiac output. Discharged with Life Vest given concern that syncope was arrhythmic. Started low dose HF meds.   Was evaluated at [facility] ED in 3/21 due to dyspnea and rib pain. New rib fracture was noted on the left. SAH had resolved. CXR and CTA negative. He was discharged to home.   Echo in 6/21 showed that EF remained 30%. Now with [employer] ICD in 8/21.  Echo in 1/22 showed EF 25-30%.    He returns today for followup of CHF.  Main complaint is a rotator cuff injury on the left.  He is not smoking.  He started drinking again due to shoulder pain but quit again about 2-3 weeks ago. No significant exertional dyspnea or chest pain.  He is working full time as a Curator.  No lightheadedness.  Taking all his meds. Weight down 11 lbs.   ECG (personally reviewed): NSR, LVH with repolarization abnormality.     [employer] device interrogation: Heartlogic score 5.   Labs (6/21): K 4.7, creatinine 1.15 Labs (7/22): K 4.5, creatinine 1.01  PMH: 1. Anxiety 2. Asthma 3. H/o ETOH abuse.  4. Chronic systolic CHF: Echo (2/21) with EF < 20%, normal RV.  Nonischemic cardiomyopathy.  - RHC/LHC (3/21): No significant CAD; mean RA 3, PA 24/5, mean PCWP 3, CI 2.3.  - Cardiac MRI (3/21): EF 25%, normal RV, no LGE.  - Echo (6/21): EF 30%, diffuse hypokinesis, normal RV, normal IVC.  [employer] ICD.  - Echo (1/22): EF 25-30% 5. Syncope: Has ICD 6. Traumatic SAH in 2/21.  7. COVID-19  1/22  SH: Does not drive. No longer smokes. Stopped drinking in 2/21, prior heavy use. Lives with son, mother in law, and father in Social worker. Difficulty paying for medications. Work on cars full time.    FH: Mom and 2 sister have heart disease. He has not idea what kind of heart problems. Mother deceased ? Cancer.   ROS: All systems negative except as listed in HPI, PMH and Problem List.   Current Outpatient Medications  Medication Sig Dispense Refill   carvedilol (COREG) 6.25 MG tablet TAKE 1 AND 1/2 TABLETS(9.375 MG) BY MOUTH TWICE DAILY WITH A MEAL 90 tablet 0   dapagliflozin propanediol (FARXIGA) 10 MG TABS tablet Take 1 tablet (10 mg total) by mouth daily before breakfast. 30 tablet 11   digoxin (LANOXIN) 0.125 MG tablet Take 1 tablet (0.125 mg total) by mouth daily. 90 tablet 3   ENTRESTO 97-103 MG TAKE 1 TABLET BY MOUTH TWICE DAILY 60 tablet 5   meclizine (ANTIVERT) 25 MG tablet Take 0.5 tablets (12.5 mg total) by mouth 3 (three) times daily as needed for dizziness. 30 tablet 6   meloxicam (MOBIC) 15 MG tablet Take 1 tablet (15 mg total) by mouth daily. 30 tablet 0   methocarbamol (ROBAXIN) 500 MG tablet Take 1 tablet (500 mg total) by mouth every 8 (eight) hours as needed for muscle spasms. 30 tablet 0   ondansetron (ZOFRAN ODT) 8 MG disintegrating  tablet Take 1 tablet (8 mg total) by mouth every 8 (eight) hours as needed for nausea. 20 tablet 0   sertraline (ZOLOFT) 25 MG tablet Take 25 mg by mouth daily.     Vitamin D, Ergocalciferol, (DRISDOL) 1.25 MG (50000 UNIT) CAPS capsule Take 50,000 Units by mouth once a week.     eplerenone (INSPRA) 50 MG tablet Take 1 tablet (50 mg total) by mouth daily. 90 tablet 3   No current facility-administered medications for this encounter.   Wt Readings from Last 3 Encounters:  05/02/21 87.8 kg (193 lb 9.6 oz)  04/04/21 90.7 kg (200 lb)  03/28/21 90.7 kg (200 lb)    Vitals:   05/02/21 0911  BP: (!) 92/58  Pulse: 77  SpO2: 94%  Weight: 87.8  kg (193 lb 9.6 oz)   General: NAD Neck: No JVD, no thyromegaly or thyroid nodule.  Lungs: Clear to auscultation bilaterally with normal respiratory effort. CV: Nondisplaced PMI.  Heart regular S1/S2, no S3/S4, no murmur.  No peripheral edema.  No carotid bruit.  Normal pedal pulses.  Abdomen: Soft, nontender, no hepatosplenomegaly, no distention.  Skin: Intact without lesions or rashes.  Neurologic: Alert and oriented x 3.  Psych: Normal affect. Extremities: No clubbing or cyanosis.  HEENT: Normal.   Assessment/Plan: 1. Chronic Systolic HF:  Nonischemic cardiomyopathy. Echo in 2/21 showed LV dilation with EF < 20%, the RV appeared normal. Given the LV dilation, I suspected that the cardiomyopathy was not new (probably less likely to be a stress cardiomyopathy related to the subarachnoid hemorrhage). 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 He quit ETOH, restarted, then quit 2-3 weeks ago.  Cannot rule out viral cardiomyopathy.  He has multiple family members with "heart disease" but he does not have details. Coronary angiography did not show any significant CAD.  Cardiac MRI showed no infiltrative disease, no prior MI, EF 25%, RV normal.  Echo in 6/21 showed EF 30% with diffuse hypokinesis, mild LV dilation.  Echo in 1/22 with EF 25-30%.  He had a [employer] ICD placed.  NYHA class I symptoms, he is not volume overloaded on exam or by Heartlogic.   - Continue Entresto 97/103 bid.  - Continue digoxin 0.125 mg daily. Check level today  - Increase eplerenone to 50 mg daily with BMET today and in 10 days.   - Continue Coreg 9.375 mg bid, will not increase today with soft BP.   - Continue Farxiga 10 mg daily.  2. Syncope: Initial presentation with syncope, no prodrome.  Given low EF, ?arrhythmic event.  He now has ICD.  3. SAH: Small volume SAH post-syncope in 2/21.  4. ETOH abuse: I strongly encouraged him to remain abstinent.  Suspect this played a role in his cardiomyopathy.    Followup with HF pharmacist in 3 wks for med titration (Coreg).  Followup APP in 3 months.   Marca Ancona 05/02/2021

## 2021-05-02 NOTE — Patient Instructions (Addendum)
EKG done today.  Labs done today. We will contact you only if your labs are abnormal.  INCREASE Eplerenone (Inspra) to 50mg  (1 tablet) by mouth daily.   No other medication changes were made. Please continue all current medications as prescribed.  Your physician recommends that you schedule a follow-up appointment in: 10 days for a lab only appointment, 3-4 weeks with our Pharmacy Clinic and in 3 months with our APP Clinic here in our office.   If you have any questions or concerns before your next appointment please send a message through [place] or call our office at [phone].    TO LEAVE A MESSAGE FOR THE NURSE SELECT OPTION 2, PLEASE LEAVE A MESSAGE INCLUDING: YOUR NAME DATE OF BIRTH CALL BACK NUMBER REASON FOR CALL**this is important as we prioritize the call backs  YOU WILL RECEIVE A CALL BACK THE SAME DAY AS LONG AS YOU CALL BEFORE 4:00 PM   Do the following things EVERYDAY: Weigh yourself in the morning before breakfast. Write it down and keep it in a log. Take your medicines as prescribed Eat low salt foods--Limit salt (sodium) to 2000 mg per day.  Stay as active as you can everyday Limit all fluids for the day to less than 2 liters   At the [facility], you and your health needs are our priority. As part of our continuing mission to provide you with exceptional heart care, we have created designated Provider Care Teams. These Care Teams include your primary Cardiologist (physician) and Advanced Practice Providers (APPs- Physician Assistants and Nurse Practitioners) who all work together to provide you with the care you need, when you need it.   You may see any of the following providers on your designated Care Team at your next follow up: Dr [phone] Dr Arvilla Meres, NP Carron Curie, Robbie Lis [place], PharmD   Please be sure to bring in all your medications bottles to every appointment.

## 2021-05-15 ENCOUNTER — Telehealth (HOSPITAL_COMMUNITY): Payer: Self-pay | Admitting: Pharmacy Technician

## 2021-05-15 ENCOUNTER — Other Ambulatory Visit (HOSPITAL_COMMUNITY): Payer: Self-pay

## 2021-05-15 NOTE — Telephone Encounter (Signed)
Advanced Heart Failure Patient Advocate Encounter   Received notification from [facility] that prior authorization for Andrew Hinton is required.   PA submitted on CoverMyMeds Key [id number] Status is pending   Will continue to follow.

## 2021-05-17 ENCOUNTER — Other Ambulatory Visit (HOSPITAL_COMMUNITY): Payer: Self-pay

## 2021-05-17 NOTE — Telephone Encounter (Signed)
Advanced Heart Failure Patient Advocate Encounter  Prior Authorization for [name] has been approved.    PA# [id number] Effective dates: 05/08/21 through "until further notice"  Archer Asa, CPhT

## 2021-05-30 ENCOUNTER — Ambulatory Visit (HOSPITAL_COMMUNITY)
Admission: RE | Admit: 2021-05-30 | Discharge: 2021-05-30 | Disposition: A | Discharge status: Home / Self Care | Payer: Medicaid Other | Source: Ambulatory Visit | Attending: Internal Medicine | Admitting: Internal Medicine

## 2021-05-30 ENCOUNTER — Other Ambulatory Visit: Payer: Self-pay

## 2021-05-30 VITALS — BP 130/98 | HR 84 | Wt 201.2 lb

## 2021-05-30 DIAGNOSIS — I5022 Chronic systolic (congestive) heart failure: Secondary | ICD-10-CM | POA: Insufficient documentation

## 2021-05-30 DIAGNOSIS — I428 Other cardiomyopathies: Secondary | ICD-10-CM | POA: Diagnosis not present

## 2021-05-30 DIAGNOSIS — Z79899 Other long term (current) drug therapy: Secondary | ICD-10-CM | POA: Diagnosis not present

## 2021-05-30 DIAGNOSIS — Z95810 Presence of automatic (implantable) cardiac defibrillator: Secondary | ICD-10-CM | POA: Insufficient documentation

## 2021-05-30 DIAGNOSIS — F1011 Alcohol abuse, in remission: Secondary | ICD-10-CM | POA: Diagnosis not present

## 2021-05-30 LAB — BASIC METABOLIC PANEL
Anion gap: 8 (ref 5–15)
BUN: 15 mg/dL (ref 6–20)
CO2: 28 mmol/L (ref 22–32)
Calcium: 9.3 mg/dL (ref 8.9–10.3)
Chloride: 102 mmol/L (ref 98–111)
Creatinine, Ser: 1.14 mg/dL (ref 0.61–1.24)
GFR, Estimated: >60 mL/min (ref >60)
Glucose, Bld: 89 mg/dL (ref 70–99)
Potassium: 4.2 mmol/L (ref 3.5–5.1)
Sodium: 138 mmol/L (ref 135–145)

## 2021-05-30 MED ORDER — CARVEDILOL 12.5 MG PO TABS: 12.5000 mg | ORAL_TABLET | Freq: Two times a day (BID) | ORAL | 11 refills | Status: DC

## 2021-05-30 NOTE — Progress Notes (Signed)
PCP: [facility] Primary Cardiologist: Dr Shirlee Latch    HPI: Andrew Hinton is a 45 y.o. with history of anxiety, asthma, and depression. Previously a heavy drinker and smoker.  No longer has driver's license due to multiple DWIs.    Admitted 10/08/2019 after syncopal episode and dyspnea with exertion. Had small SAH and rib fracture. Had echo that showed severely reduced EF, new onset acute systolic heart failure. Cardiac cath was negative for coronary disease, and showed low filling pressures and adequate cardiac output. Discharged with Life Vest given concern that syncope was arrhythmic. Started low dose HF meds.    He was evaluated at [facility] ED in 10/2019 due to dyspnea and rib pain. New rib fracture was noted on the left. SAH had resolved. CXR and CTA negative. He was discharged to home.    Echo in 01/2020 showed that EF remained 30%. [employer] ICD placed in 03/2020.  Echo in 08/2020 showed EF 25-30%.     He returned to HF Clinic on 05/02/2021 for followup of CHF.  Main complaint was a rotator cuff injury on the left.  He reported he was not smoking.  He had started drinking again due to shoulder pain but quit again about 2-3 weeks prior to visit. No significant exertional dyspnea or chest pain.  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  No lightheadedness. Reported taking all his meds. Weight was down 11 lbs.   Today he returns to HF clinic for pharmacist medication titration. At last visit with MD on 05/02/2021, eplerenone was increased to 50 mg daily. He reports he has been feeling alright. Endorses dizziness and lightheadedness only occassionally. No chest pain or palpitations. No SOB/DOE. Weight at home 198-202 lbs. No LEE, PND or orthopnea. Appetite is okay, he tries to limit salt but still uses some.   HF Medications: Carvedilol 9.375 mg BID Entresto 97/103 BID Eplerenone 50 mg daily Farxiga 10 mg daily Digoxin 0.125 mg daily   Has the patient been experiencing any side  effects to the medications prescribed?  No  Does the patient have any problems obtaining medications due to transportation or finances?    Patient has managed Medicaid through [facility]. Of note patient has been without [place] and eplerenone for several days, b/c pharmacy "lost' them. [facility] indicated he had not picked up the remainder of a partial fill of eplerenone and it was returned to stock. They will fill both medications. Patient advised.  Understanding of regimen: fair Understanding of indications: fair Potential of compliance: good Patient understands to avoid NSAIDs. Patient understands to avoid decongestants.    Pertinent Lab Values: 9/20/222: Serum creatinine 0.95, BUN 16, Potassium 4.0, Sodium 137, Digoxin 0.3 ng/mL  05/30/21: Serum creatinine 1.14, BUN 15, Potassium 4.2, Sodium 138  Vital Signs: Weight: 201.2 lbs (last clinic weight: 193.6 lbs) Blood pressure: 130/98  Heart rate: 84   Assessment/Plan: 1. Chronic Systolic HF:  Nonischemic cardiomyopathy. Echo in 09/2019 showed LV dilation with EF < 20%, the RV appeared normal. Given the LV dilation, it was suspected that the cardiomyopathy was not new (probably less likely to be a stress cardiomyopathy related to the subarachnoid hemorrhage). 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 He quit ETOH, restarted, then quit 2-3 weeks ago.  Cannot rule out viral cardiomyopathy.  He has multiple family members with "heart disease" but he does not have details. Coronary angiography did not show any significant CAD.  Cardiac MRI showed no infiltrative disease, no prior MI, EF 25%, RV  normal.  Echo in 01/2020 showed EF 30% with diffuse hypokinesis, mild LV dilation.  Echo in 08/2020 with EF 25-30%.  He had a [employer] ICD placed.   - NYHA class I symptoms, he is not volume overloaded on exam. - Increase carvedilol to 12.5 mg BID  - Continue Entresto 97/103 mg BID  - Continue eplerenone 50 mg daily.   - Continue Farxiga  10 mg daily  - Continue digoxin 0.125 mg daily  2. Syncope: Initial presentation with syncope, no prodrome.  Given low EF, ?arrhythmic event.  He now has ICD.  3. SAH: Small volume SAH post-syncope in 09/2019.  4. ETOH abuse: He was strongly encouraged by to remain abstinent.  Suspected that this played a role in his cardiomyopathy.   Follow up with APP clinic 08/01/2021  Mitzie Na, PharmD PGY-1 [facility] Pharmacy Resident  Karle Plumber, PharmD, BCPS, [facility], CPP Heart Failure Clinic Pharmacist [phone]

## 2021-05-30 NOTE — Patient Instructions (Signed)
It was a pleasure seeing you today!  MEDICATIONS: -We are changing your medications today -Increase carvedilol to 12.5 mg (1 tablet) twice daily. You may take 2 tablets of the 6.25 mg strength twice daily until you pick up the new strength.  -Call if you have questions about your medications.  LABS: -We will call you if your labs need attention.  NEXT APPOINTMENT: Return to clinic in 2 months with APP Clinic.  In general, to take care of your heart failure: -Limit your fluid intake to 2 Liters (half-gallon) per day.   -Limit your salt intake to ideally 2-3 grams (2000-3000 mg) per day. -Weigh yourself daily and record, and bring that "weight diary" to your next appointment.  (Weight gain of 2-3 pounds in 1 day typically means fluid weight.) -The medications for your heart are to help your heart and help you live longer.   -Please contact us before stopping any of your heart medications.  Call the clinic at [phone] with questions or to reschedule future appointments.

## 2021-06-14 IMAGING — CT CT ANGIO NECK
2 of 12 series · 6 of 36 positions shown · IV contrast (Omnipaque or Isovue)
Comparison: CT head earlier today

CLINICAL DATA: Fell down steps.  Subarachnoid hemorrhage.

EXAM:
CT ANGIOGRAPHY HEAD AND NECK
TECHNIQUE: Multidetector CT imaging of the head and neck was performed using
the standard protocol during bolus administration of intravenous
contrast. Multiplanar CT image reconstructions and MIPs were
obtained to evaluate the vascular anatomy. Carotid stenosis
measurements (when applicable) are obtained utilizing NASCET
criteria, using the distal internal carotid diameter as the
denominator.
CONTRAST:  75mL OMNIPAQUE IOHEXOL 350 MG/ML SOLN

[Series 7: ax thins · axial · 0.39mm/px · z∈[-161,+96]mm · 5 of 388 slices shown]
[im 65/388  soft-tissue]
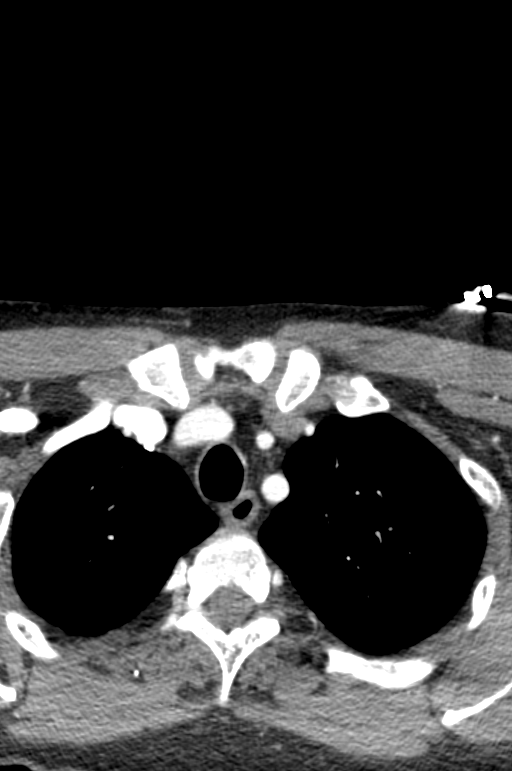
[im 130/388  bone]
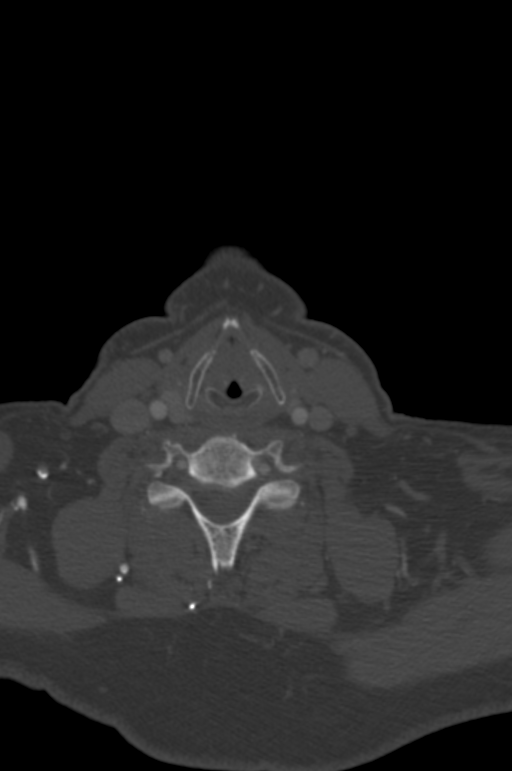
[im 194/388  soft-tissue]
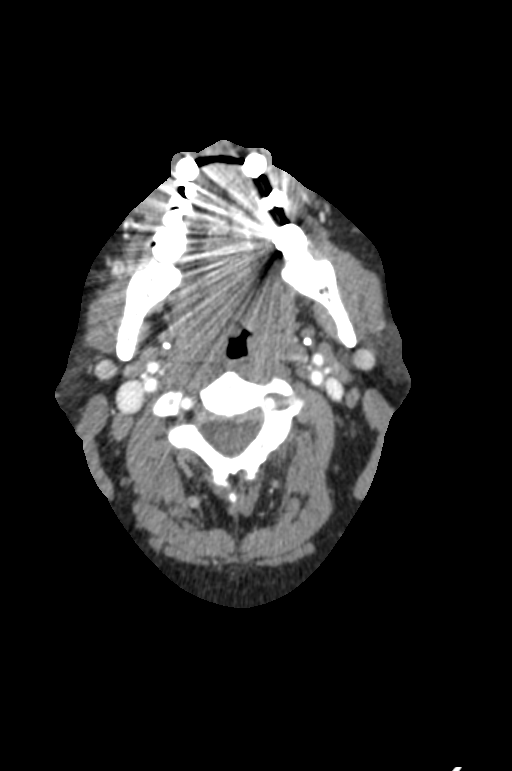
[im 259/388  bone]
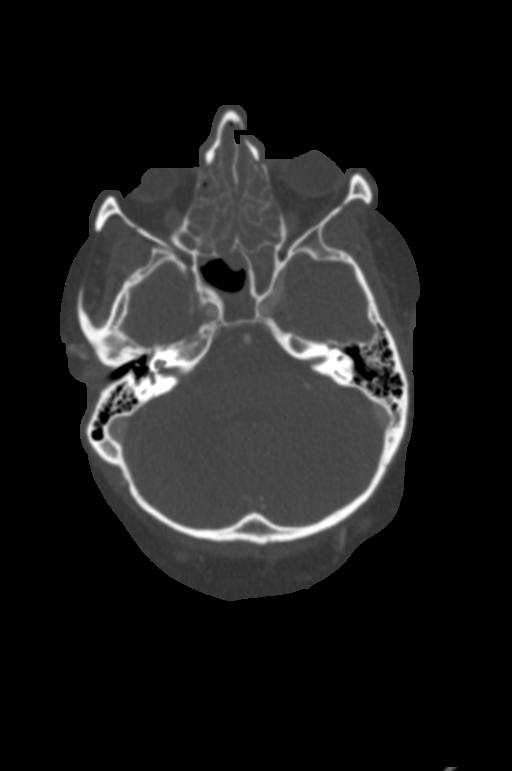
[im 323/388  soft-tissue]
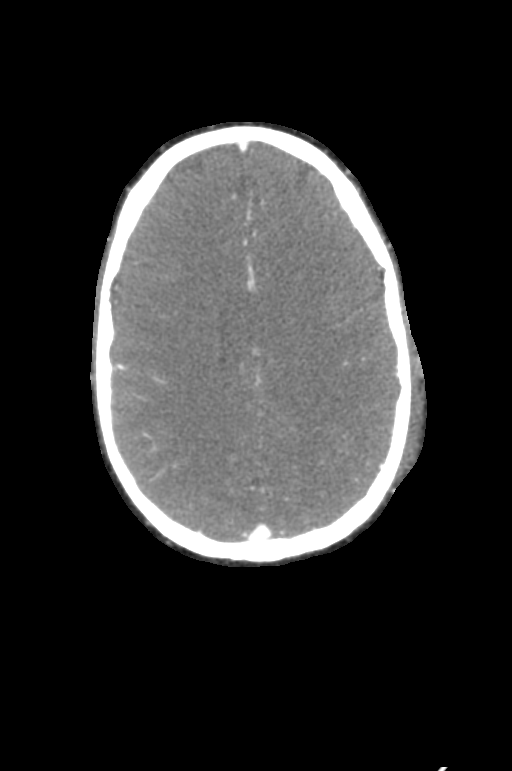

[Series 9: sag thin · sagittal · 0.56mm/px · 1 of 201 slices shown]
[im 18/201  soft-tissue]
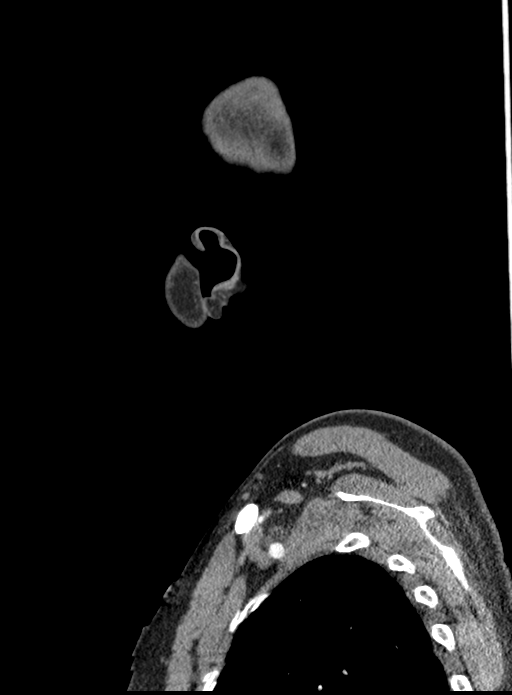

[6 of 36 positions shown; findings below may reference images not displayed]

FINDINGS: CT HEAD FINDINGS

Brain: Small volume subarachnoid hemorrhage on the right is stable.
No subdural hemorrhage. No midline shift. Basilar cisterns and
suprasellar cistern negative for hemorrhage. Ventricle size normal.
Negative for acute infarct or mass.

Vascular: Negative for hyperdense vessel

Skull: Nondepressed fracture left temporal bone squamosal portion,
unchanged from the prior study. Overlying soft tissue swelling in
the left parietal scalp.

Sinuses: Extensive mucosal edema throughout the paranasal sinuses
unchanged. Negative orbit.

Orbits: Negative orbit

Review of the MIP images confirms the above findings

NECK FINDINGS

Aortic arch: Normal aortic arch. Bovine branching pattern. Proximal
great vessels normal.

Right carotid system: Normal right carotid. Negative for
atherosclerotic disease or dissection.

Left carotid system: Normal left carotid. Negative for
atherosclerotic disease or dissection

Vertebral arteries: Both vertebral arteries are normal and widely
patent to the basilar

Skeleton: Negative for cervical spine fracture. Poor dentition with
multiple caries

Other neck: Negative for mass or soft tissue swelling in the neck.

Upper chest: Lung apices clear bilaterally.

Review of the MIP images confirms the above findings

CTA HEAD FINDINGS

Anterior circulation: Cavernous carotid widely patent and normal
bilaterally. No stenosis or atherosclerotic disease. Negative for
aneurysm. Anterior and middle cerebral arteries normal bilaterally
without stenosis or aneurysm.

Posterior circulation: Both vertebral arteries patent to the
basilar. PICA patent bilaterally. Basilar widely patent. Superior
cerebellar and posterior cerebral arteries widely patent
bilaterally. Negative for aneurysm.

Venous sinuses: Normal venous enhancement.

Anatomic variants: None

Review of the MIP images confirms the above findings
IMPRESSION: 1. Small volume subarachnoid hemorrhage on the right is stable. No
new hemorrhage identified.
2. Nondisplaced fracture left temporal bone with overlying soft
tissue swelling in the scalp. Findings suggest significant head
trauma with contrecoup subarachnoid hemorrhage on the right.
3. Negative for cerebral aneurysm
4. No significant intracranial or extracranial arterial stenosis.
5. These results were called by telephone at the time of
interpretation on 10/08/2019 at [DATE] to provider MCKENZY TAKAHASHI ,
who verbally acknowledged these results.

## 2021-06-14 IMAGING — CT CT HEAD W/O CM
4 series · 16 of 47 positions shown, 18 images · non-contrast
Comparison: None.

CLINICAL DATA: Fell down steps. Loss of consciousness.

EXAM:
CT HEAD WITHOUT CONTRAST
CT CERVICAL SPINE WITHOUT CONTRAST
TECHNIQUE: Multidetector CT imaging of the head and cervical spine was
performed following the standard protocol without intravenous
contrast. Multiplanar CT image reconstructions of the cervical spine
were also generated.

[Series 2: head w o · axial · 0.44mm/px · z∈[+23,+143]mm · 7 of 32 slices shown, 9 images]
[im 4/32  brain]
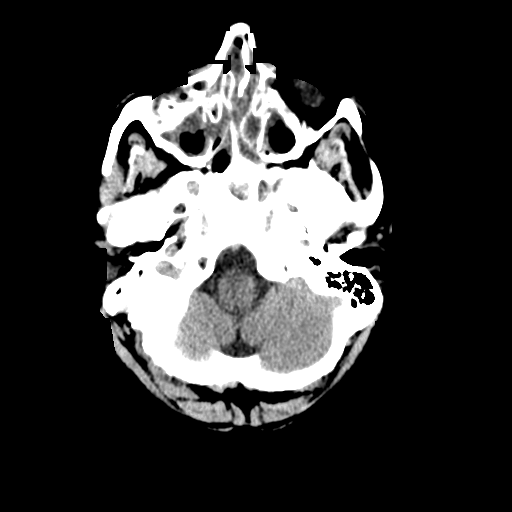
[im 4/32  bone]
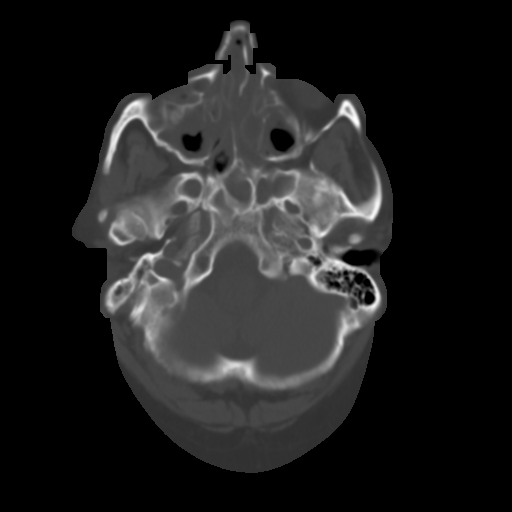
[im 8/32  brain]
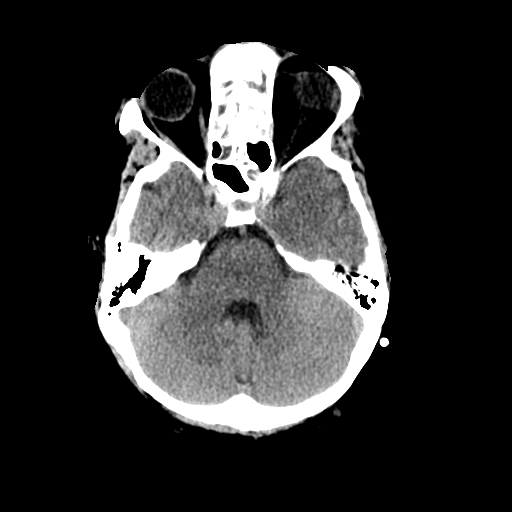
[im 12/32  brain]
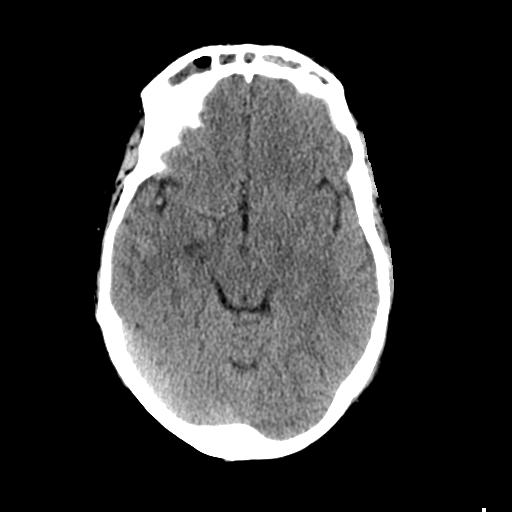
[im 16/32  brain]
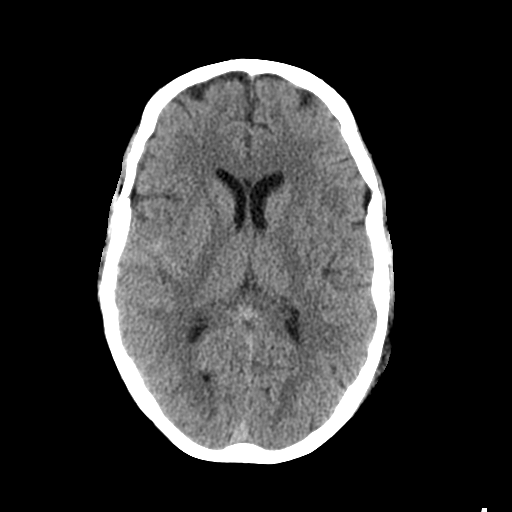
[im 20/32  brain]
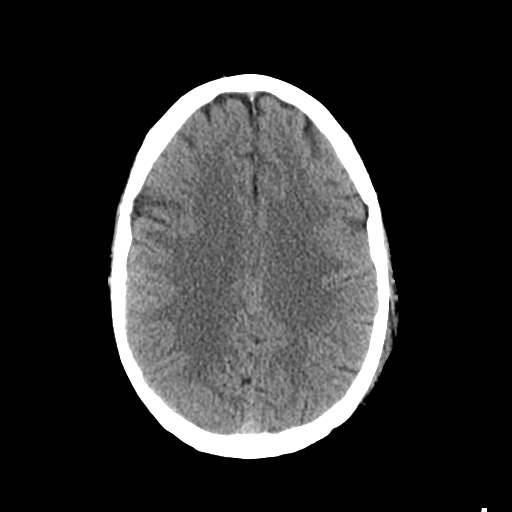
[im 20/32  bone]
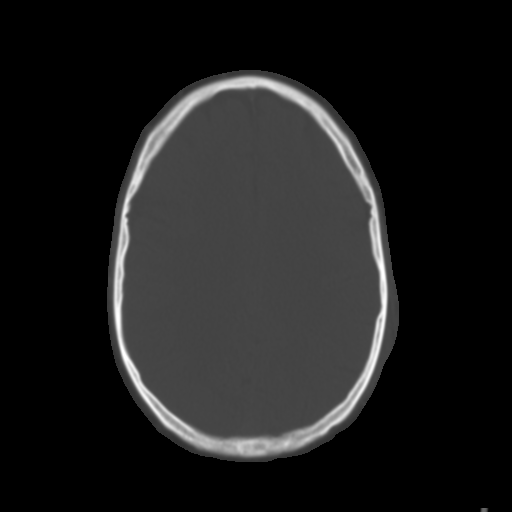
[im 24/32  brain]
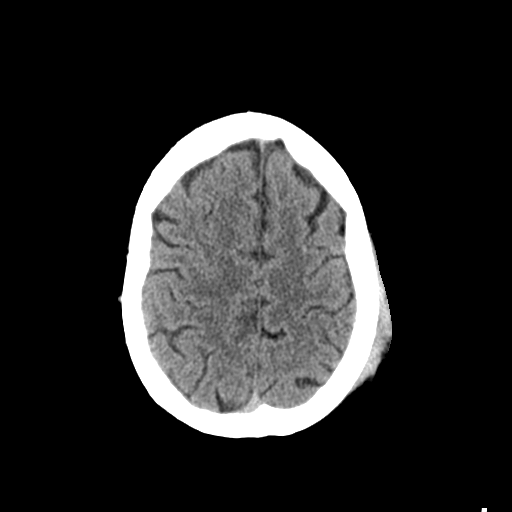
[im 28/32  brain]
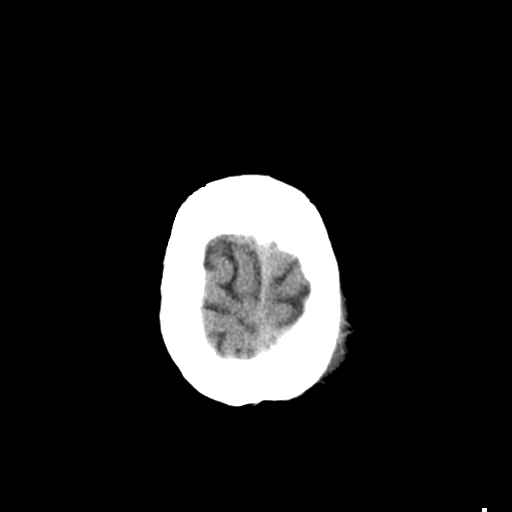

[Series 3: head bone · axial · 0.44mm/px · z∈[+22,+54]mm · 3 of 80 slices shown]
[im 8/80  bone]
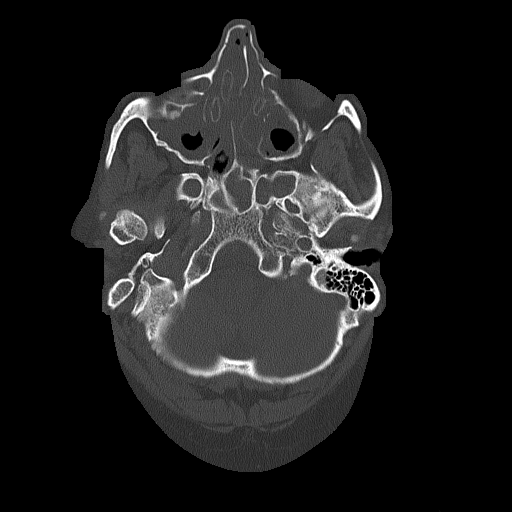
[im 16/80  bone]
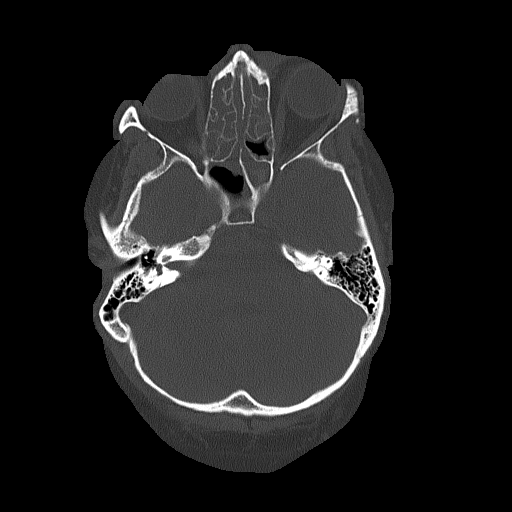
[im 24/80  bone]
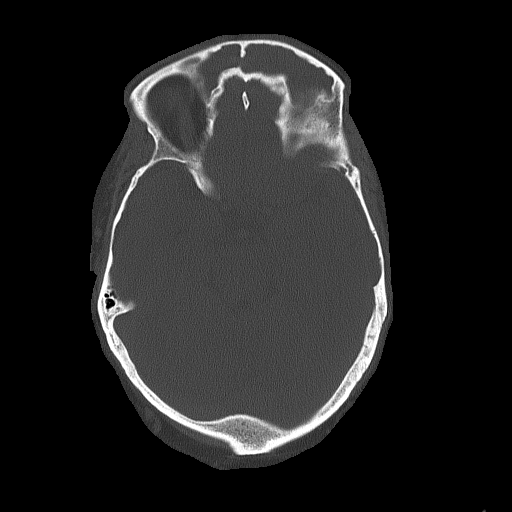

[Series 4: coronal soft · coronal · 0.33mm/px · 3 of 71 slices shown]
[im 24/71  brain]
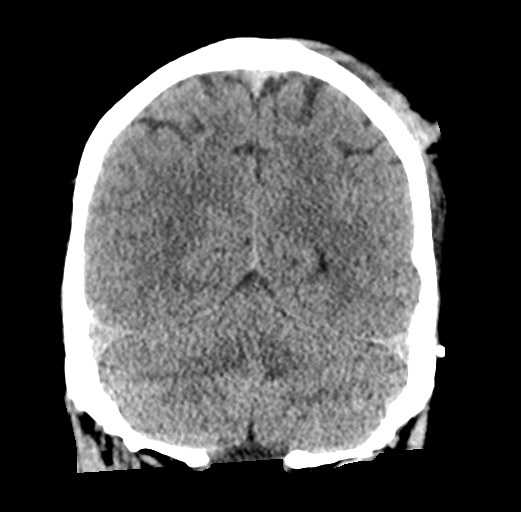
[im 32/71  brain]
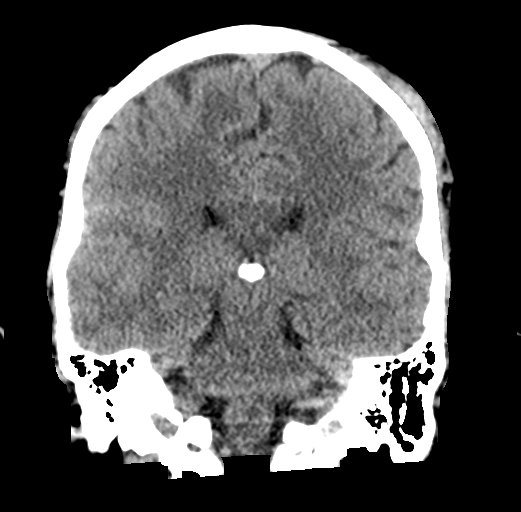
[im 39/71  brain]
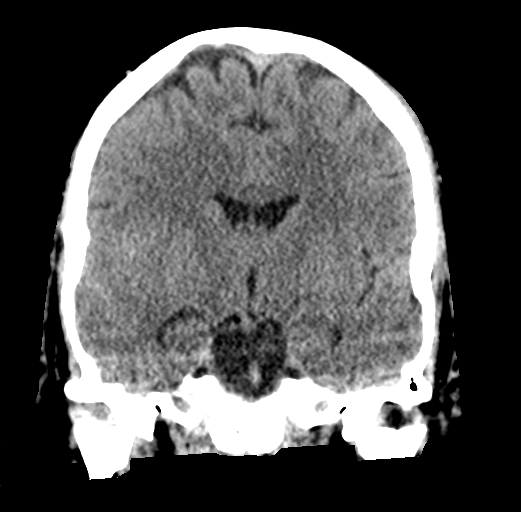

[Series 5: sagittal soft · sagittal · 0.34mm/px · 3 of 51 slices shown]
[im 17/51  brain]
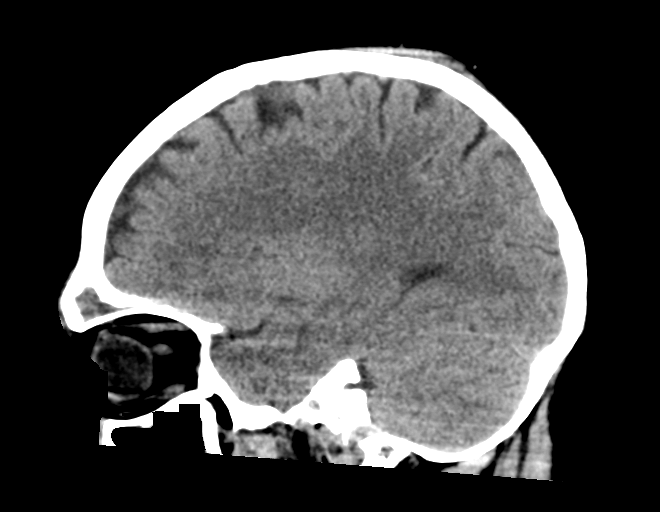
[im 26/51  brain]
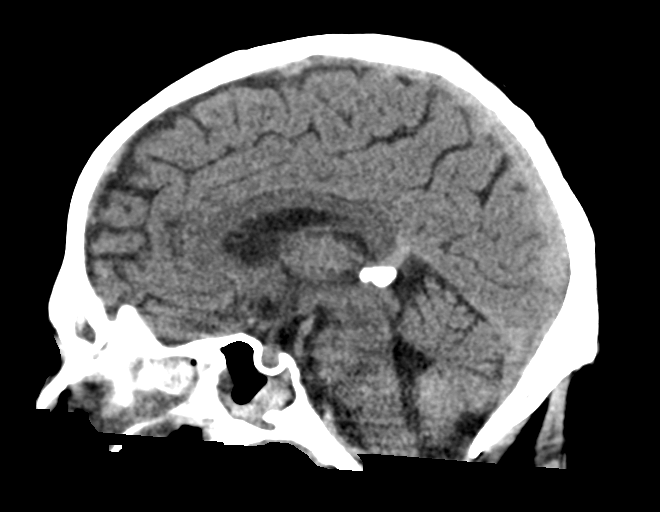
[im 34/51  brain]
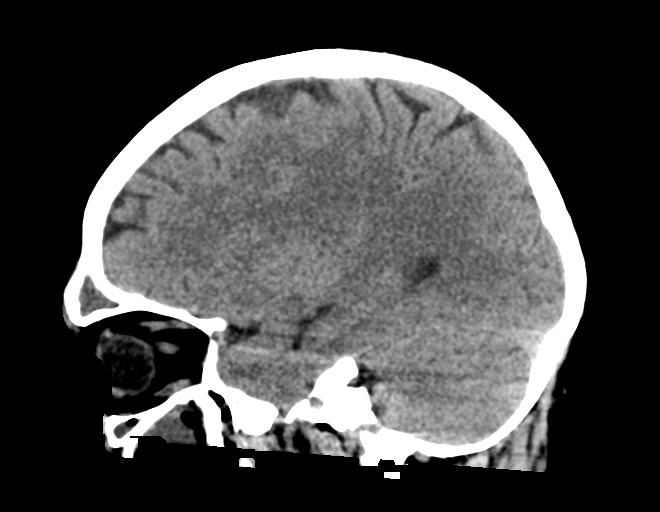

[16 of 47 positions shown; findings below may reference images not displayed]

FINDINGS: CT HEAD FINDINGS

Brain: Acute subarachnoid hemorrhage is seen in the right temporal
lobe and temporoparietal region, well demonstrated on axial images
13, 14 of series 2 and coronal images 36 through 39 of series 4. No
midline shift. No hydrocephalus. No mass lesion evident.

Vascular: No hyperdense vessel or unexpected calcification.

Skull: No evidence for fracture. No worrisome lytic or sclerotic
lesion. Chronic deformity noted in the mandibular condyles
bilaterally with bilateral TMJ subluxation.

Sinuses/Orbits: Near complete opacification noted in the bilateral
frontal sinuses, bilateral ethmoid air cells, and right maxillary
sinus. Extensive chronic mucosal disease noted in the left maxillary
sinus. Mastoid air cells are clear bilaterally.

Other: None.

CT CERVICAL SPINE FINDINGS

Alignment: Normal.

Skull base and vertebrae: No acute fracture. No primary bone lesion
or focal pathologic process.

Soft tissues and spinal canal: No prevertebral fluid or swelling. No
visible canal hematoma.

Disc levels:  Preserved throughout.

Upper chest: Unremarkable.

Other: None.
IMPRESSION: 1. Acute subarachnoid hemorrhage identified in the right temporal
and temporoparietal region. No associated mass effect or midline
shift.
2. No cervical spine fracture.
3. Chronic paranasal sinusitis.
4. Chronic deformity of the mandibular condyles bilaterally,
potentially posttraumatic or degenerative with subluxation of the
bilateral temporomandibular joints.

Critical Value/emergent results were called by telephone at the time
of interpretation on 10/08/2019 at [DATE] to provider INGUNN HARPA RONLOR
, who verbally acknowledged these results.

## 2021-06-20 ENCOUNTER — Encounter (HOSPITAL_COMMUNITY): Payer: Self-pay | Admitting: *Deleted

## 2021-06-20 ENCOUNTER — Emergency Department (HOSPITAL_COMMUNITY): Payer: Medicaid Other

## 2021-06-20 ENCOUNTER — Emergency Department (HOSPITAL_COMMUNITY)
Admission: EM | Admit: 2021-06-20 | Discharge: 2021-06-20 | Disposition: A | Discharge status: Home / Self Care | Payer: Medicaid Other | Attending: Emergency Medicine | Admitting: Emergency Medicine

## 2021-06-20 DIAGNOSIS — Z8616 Personal history of COVID-19: Secondary | ICD-10-CM | POA: Diagnosis not present

## 2021-06-20 DIAGNOSIS — I5042 Chronic combined systolic (congestive) and diastolic (congestive) heart failure: Secondary | ICD-10-CM | POA: Insufficient documentation

## 2021-06-20 DIAGNOSIS — R600 Localized edema: Secondary | ICD-10-CM | POA: Diagnosis not present

## 2021-06-20 DIAGNOSIS — I110 Hypertensive heart disease with heart failure: Secondary | ICD-10-CM | POA: Diagnosis not present

## 2021-06-20 DIAGNOSIS — R079 Chest pain, unspecified: Secondary | ICD-10-CM | POA: Diagnosis not present

## 2021-06-20 DIAGNOSIS — J45909 Unspecified asthma, uncomplicated: Secondary | ICD-10-CM | POA: Insufficient documentation

## 2021-06-20 DIAGNOSIS — Z79899 Other long term (current) drug therapy: Secondary | ICD-10-CM | POA: Insufficient documentation

## 2021-06-20 LAB — CBC
HCT: 42.5 % (ref 39.0–52.0)
Hemoglobin: 14.9 g/dL (ref 13.0–17.0)
MCH: 33 pg (ref 26.0–34.0)
MCHC: 35.1 g/dL (ref 30.0–36.0)
MCV: 94 fL (ref 80.0–100.0)
Platelets: 385 10*3/uL (ref 150–400)
RBC: 4.52 MIL/uL (ref 4.22–5.81)
RDW: 12.3 % (ref 11.5–15.5)
WBC: 10.6 10*3/uL — ABNORMAL HIGH (ref 4.0–10.5)
nRBC: 0 % (ref 0.0–0.2)

## 2021-06-20 LAB — BASIC METABOLIC PANEL
Anion gap: 10 (ref 5–15)
BUN: 22 mg/dL — ABNORMAL HIGH (ref 6–20)
CO2: 29 mmol/L (ref 22–32)
Calcium: 9.2 mg/dL (ref 8.9–10.3)
Chloride: 98 mmol/L (ref 98–111)
Creatinine, Ser: 1.19 mg/dL (ref 0.61–1.24)
GFR, Estimated: >60 mL/min (ref >60)
Glucose, Bld: 121 mg/dL — ABNORMAL HIGH (ref 70–99)
Potassium: 3.8 mmol/L (ref 3.5–5.1)
Sodium: 137 mmol/L (ref 135–145)

## 2021-06-20 LAB — TROPONIN I (HIGH SENSITIVITY)
Troponin I (High Sensitivity): 7 ng/L (ref <18)
Troponin I (High Sensitivity): 6 ng/L (ref <18)

## 2021-06-20 NOTE — Discharge Instructions (Addendum)
The testing today did not show any problems with heart or lungs.  Continue taking your usual medicines.  Use Tylenol if needed for pain.  Return here if needed for problems.

## 2021-06-20 NOTE — ED Notes (Signed)
Pt c/o CP since yesterday.  States numbness with slight weakness to left arm only since 0830-0900 this morning.

## 2021-06-20 NOTE — ED Provider Notes (Signed)
[facility] EMERGENCY DEPARTMENT Provider Note   CSN: [id number] Arrival date & time: 06/20/21  1759     History Chief Complaint  Patient presents with   Chest Pain    Andrew Hinton is a 45 y.o. male.  HPI He presents for evaluation of chest pain which radiates to the left arm is a numb feeling, which started yesterday and is persistent today.  He denies associated nausea, vomiting, diarrhea, shortness of breath, focal weakness or paresthesia.  He has a history of heart disease, and has a cardiac defibrillator in place.  He has a history of systolic heart failure.  He reports taking his usual medications without relief.  He denies other recent illnesses.  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/05/2020   Chronic systolic heart failure (HCC) 03/16/2020   Syncope 10/09/2019   AKI (acute kidney injury) (HCC) 10/09/2019   Acute systolic CHF (congestive heart failure) (HCC) 10/09/2019   Subarachnoid hemorrhage (HCC) 10/08/2019   Adjustment disorder with depressed mood 02/16/2015   Acute pain of left knee 02/01/2015   Allergic rhinitis 01/17/2015    Past Surgical History:  Procedure Laterality Date   APPENDECTOMY     ICD IMPLANT N/A 03/22/2020   Procedure: ICD IMPLANT;  Surgeon: Marinus Maw, MD;  Location: [facility] INVASIVE CV LAB;  Service: Cardiovascular;  Laterality: N/A;   RIGHT/LEFT HEART CATH AND CORONARY ANGIOGRAPHY N/A  10/12/2019   Procedure: RIGHT/LEFT HEART CATH AND CORONARY ANGIOGRAPHY;  Surgeon: Laurey Morale, MD;  Location: [facility] INVASIVE CV LAB;  Service: Cardiovascular;  Laterality: N/A;       No family history on file.  Social History   Tobacco Use   Smoking status: Former    Packs/day: 0.50    Types: Cigarettes    Quit date: 10/07/2017    Years since quitting: 3.7   Smokeless tobacco: Never  Vaping Use   Vaping Use: Never used  Substance Use Topics   Alcohol use: Yes    Comment: occasionally    Drug use: No    Home Medications Prior to Admission medications   Medication Sig Start Date End Date Taking? Authorizing Provider  carvedilol (COREG) 12.5 MG tablet Take 1 tablet (12.5 mg total) by mouth 2 (two) times daily with a meal. 05/30/21   Laurey Morale, MD  digoxin (LANOXIN) 0.125 MG tablet Take 1 tablet (0.125 mg total) by mouth daily. 02/11/20   Laurey Morale, MD  ENTRESTO 97-103 MG TAKE 1 TABLET BY MOUTH TWICE DAILY 08/10/20   Laurey Morale, MD  eplerenone (INSPRA) 50 MG tablet Take 1 tablet (50 mg total) by mouth daily. 05/02/21   Laurey Morale, MD  FARXIGA 10 MG TABS tablet TAKE 1 TABLET(10 MG) BY MOUTH DAILY BEFORE BREAKFAST 05/08/21   Laurey Morale, MD  meclizine (ANTIVERT) 25 MG tablet TAKE 1/2 TABLET(12.5 MG) BY MOUTH THREE TIMES DAILY AS NEEDED FOR DIZZINESS  05/03/21   Marinus Maw, MD  meloxicam (MOBIC) 15 MG tablet Take 1 tablet (15 mg total) by mouth daily. Patient not taking: Reported on 05/30/2021 04/04/21   Oliver Barre, MD  methocarbamol (ROBAXIN) 500 MG tablet Take 1 tablet (500 mg total) by mouth every 8 (eight) hours as needed for muscle spasms. 04/04/21   Oliver Barre, MD  sertraline (ZOLOFT) 25 MG tablet Take 25 mg by mouth daily. 07/19/20   [provider]  Vitamin D, Ergocalciferol, (DRISDOL) 1.25 MG (50000 UNIT) CAPS capsule Take 50,000 Units by mouth once a week. 03/30/21   [provider]    Allergies     Spironolactone  Review of Systems   Review of Systems  All other systems reviewed and are negative.  Physical Exam Updated Vital Signs BP (!) 157/100   Pulse 63   Temp 98.1 F (36.7 C) (Oral)   Resp 16   SpO2 97%   Physical Exam Vitals and nursing note reviewed.  Constitutional:      General: He is not in acute distress.    Appearance: He is well-developed. He is not ill-appearing, toxic-appearing or diaphoretic.  HENT:     Head: Normocephalic and atraumatic.     Right Ear: External ear normal.     Left Ear: External ear normal.  Eyes:     Conjunctiva/sclera: Conjunctivae normal.     Pupils: Pupils are equal, round, and reactive to light.  Neck:     Trachea: Phonation normal.  Cardiovascular:     Rate and Rhythm: Normal rate and regular rhythm.     Heart sounds: Normal heart sounds.  Pulmonary:     Effort: Pulmonary effort is normal.     Breath sounds: Normal breath sounds.  Abdominal:     Palpations: Abdomen is soft.     Tenderness: There is no abdominal tenderness.  Musculoskeletal:        General: Normal range of motion.     Cervical back: Normal range of motion and neck supple.     Right lower leg: Edema present.     Left lower leg: Edema present.     Comments: 1+ bilateral lower extremity edema.  Skin:    General: Skin is warm and dry.  Neurological:     Mental Status: He is alert and oriented to person, place, and time.     Cranial Nerves: No cranial nerve deficit.     Sensory: No sensory deficit.     Motor: No abnormal muscle tone.     Coordination: Coordination normal.  Psychiatric:        Mood and Affect: Mood normal.        Behavior: Behavior normal.        Thought Content: Thought content normal.        Judgment: Judgment normal.    ED Results / Procedures / Treatments   Labs (all labs ordered are listed, but only abnormal results are displayed) Labs Reviewed  BASIC METABOLIC PANEL - Abnormal; Notable for the following components:       Result Value   Glucose, Bld 121 (*)    BUN 22 (*)    All other components within normal limits  CBC - Abnormal; Notable for the following components:   WBC 10.6 (*)    All other components within normal limits  TROPONIN I (HIGH SENSITIVITY)  TROPONIN I (HIGH SENSITIVITY)    EKG EKG Interpretation  Date/Time:  Tuesday June 20 2021 18:59:40 EST Ventricular  Rate:  77 PR Interval:  129 QRS Duration: 95 QT Interval:  398 QTC Calculation: 451 R Axis:   18 Text Interpretation: Sinus rhythm Abnormal R-wave progression, early transition Probable LVH with secondary repol abnrm since last tracing no significant change Confirmed by Mancel Bale [phone]) on 06/20/2021 8:05:07 PM  Radiology DG Chest Portable 1 View  Result Date: 06/20/2021 CLINICAL DATA:  Chest pain. EXAM: PORTABLE CHEST 1 VIEW COMPARISON:  Chest radiograph dated 03/28/2021. FINDINGS: No focal consolidation, pleural effusion, pneumothorax the cardiac silhouette is within normal limits. Left pectoral AICD device. No acute osseous pathology. IMPRESSION: No active disease. Electronically Signed   By: Elgie Collard M.D.   On: 06/20/2021 19:08    Procedures Procedures   Medications Ordered in ED Medications - No data to display  ED Course  I have reviewed the triage vital signs and the nursing notes.  Pertinent labs & imaging results that were available during my care of the patient were reviewed by me and considered in my medical decision making (see chart for details).    MDM Rules/Calculators/A&P                            Patient Vitals for the past 24 hrs:  BP Temp Temp src Pulse Resp SpO2  06/20/21 2230 (!) 157/100 -- -- 63 16 97 %  06/20/21 2215 -- -- -- 75 15 97 %  06/20/21 2200 (!) 160/100 -- -- 68 13 98 %  06/20/21 2108 (!) 149/97 98.1 F (36.7 C) Oral 80 17 99 %  06/20/21 2045 -- -- -- 75 14 96 %  06/20/21 2030 (!) 153/102 -- -- 75 19 97 %  06/20/21 1930 (!) 137/95 -- -- 75 14 98 %  06/20/21 1915  -- -- -- 82 15 97 %  06/20/21 1900 (!) 141/90 -- -- 79 18 99 %  06/20/21 1845 -- -- -- 82 13 98 %  06/20/21 1830 131/84 -- -- 92 18 98 %  06/20/21 1815 -- -- -- 85 12 98 %  06/20/21 1807 (!) 151/95 98 F (36.7 C) Oral 83 15 98 %    10:28 PM Reevaluation with update and discussion. After initial assessment and treatment, an updated evaluation reveals he is sitting up, comfortable has no further complaints.  Findings discussed and questions answered. Mancel Bale   Medical Decision Making:  This patient is presenting for evaluation of chest pain radiating to left arm, which does require a range of treatment options, and is a complaint that involves a moderate risk of morbidity and mortality. The differential diagnoses include ACS, musculoskeletal pain, pulmonary disorder. I decided to review old records, and in summary middle age male with history of heart disease, heart failure, cardiac defibrillator, presenting with chest pain.  He has history of AKI subarachnoid hemorrhage, hypertension, and chronic back pain..  I did not require additional historical information from anyone.  Clinical Laboratory Tests Ordered, included CBC, Metabolic panel, and troponin . Review indicates normal including delta troponin. Radiologic Tests Ordered, included chest x-ray.  I independently Visualized: Radiograph images, which show no infiltrate or edema  Cardiac Monitor Tracing which shows normal sinus   Critical Interventions-clinical evaluation, laboratory testing, radiography, observation and reassessment  After These Interventions, the Patient was reevaluated and was found stable for discharge.  Nonspecific symptoms, with reassuring evaluation in the ED.  Doubt ACS, PE, pneumonia, CVA or cervical radiculopathy.  CRITICAL CARE-no Performed by: Mancel Bale  Nursing Notes Reviewed/ Care Coordinated Applicable Imaging Reviewed Interpretation of Laboratory Data incorporated into ED treatment  The  patient appears reasonably screened and/or stabilized for discharge and I doubt any other medical condition or other [facility] requiring further screening, evaluation, or treatment in the ED at this time prior to discharge.  Plan: Home Medications-continue usual; Home Treatments-rest, fluids; return here if the recommended treatment, does not improve the symptoms; Recommended follow up-PCP [place]     Final Clinical Impression(s) / ED Diagnoses Final diagnoses:  Nonspecific chest pain    Rx / DC Orders ED Discharge Orders     None        Mancel Bale, MD 06/22/21 1709

## 2021-06-20 NOTE — ED Triage Notes (Signed)
Chest pain onset yesterday 

## 2021-06-22 ENCOUNTER — Emergency Department (HOSPITAL_COMMUNITY): Payer: Medicaid Other

## 2021-06-22 ENCOUNTER — Other Ambulatory Visit: Payer: Self-pay

## 2021-06-22 ENCOUNTER — Emergency Department (HOSPITAL_COMMUNITY)
Admission: EM | Admit: 2021-06-22 | Discharge: 2021-06-22 | Disposition: A | Discharge status: Home / Self Care | Payer: Medicaid Other | Attending: Emergency Medicine | Admitting: Emergency Medicine

## 2021-06-22 ENCOUNTER — Encounter (HOSPITAL_COMMUNITY): Payer: Self-pay

## 2021-06-22 ENCOUNTER — Telehealth (HOSPITAL_COMMUNITY): Payer: Self-pay

## 2021-06-22 DIAGNOSIS — Z8616 Personal history of COVID-19: Secondary | ICD-10-CM | POA: Insufficient documentation

## 2021-06-22 DIAGNOSIS — Z87891 Personal history of nicotine dependence: Secondary | ICD-10-CM | POA: Diagnosis not present

## 2021-06-22 DIAGNOSIS — J45909 Unspecified asthma, uncomplicated: Secondary | ICD-10-CM | POA: Insufficient documentation

## 2021-06-22 DIAGNOSIS — R079 Chest pain, unspecified: Secondary | ICD-10-CM | POA: Diagnosis not present

## 2021-06-22 DIAGNOSIS — R200 Anesthesia of skin: Secondary | ICD-10-CM | POA: Diagnosis not present

## 2021-06-22 DIAGNOSIS — I5042 Chronic combined systolic (congestive) and diastolic (congestive) heart failure: Secondary | ICD-10-CM | POA: Diagnosis not present

## 2021-06-22 DIAGNOSIS — R0602 Shortness of breath: Secondary | ICD-10-CM | POA: Insufficient documentation

## 2021-06-22 LAB — BASIC METABOLIC PANEL
Anion gap: 7 (ref 5–15)
BUN: 13 mg/dL (ref 6–20)
CO2: 28 mmol/L (ref 22–32)
Calcium: 9.3 mg/dL (ref 8.9–10.3)
Chloride: 102 mmol/L (ref 98–111)
Creatinine, Ser: 1.06 mg/dL (ref 0.61–1.24)
GFR, Estimated: >60 mL/min (ref >60)
Glucose, Bld: 114 mg/dL — ABNORMAL HIGH (ref 70–99)
Potassium: 4 mmol/L (ref 3.5–5.1)
Sodium: 137 mmol/L (ref 135–145)

## 2021-06-22 LAB — CBC
HCT: 44.8 % (ref 39.0–52.0)
Hemoglobin: 15.7 g/dL (ref 13.0–17.0)
MCH: 33.5 pg (ref 26.0–34.0)
MCHC: 35 g/dL (ref 30.0–36.0)
MCV: 95.7 fL (ref 80.0–100.0)
Platelets: 388 10*3/uL (ref 150–400)
RBC: 4.68 MIL/uL (ref 4.22–5.81)
RDW: 12.3 % (ref 11.5–15.5)
WBC: 8.5 10*3/uL (ref 4.0–10.5)
nRBC: 0 % (ref 0.0–0.2)

## 2021-06-22 LAB — DIGOXIN LEVEL: Digoxin Level: <0.2 ng/mL — ABNORMAL LOW (ref 0.8–2.0)

## 2021-06-22 LAB — TROPONIN I (HIGH SENSITIVITY)
Troponin I (High Sensitivity): 5 ng/L (ref <18)
Troponin I (High Sensitivity): 5 ng/L (ref <18)

## 2021-06-22 LAB — D-DIMER, QUANTITATIVE: D-Dimer, Quant: 0.36 ug/mL-FEU (ref 0.00–0.50)

## 2021-06-22 MED ORDER — MORPHINE SULFATE (PF) 4 MG/ML IV SOLN
4.0000 mg | Freq: Once | INTRAVENOUS | Status: AC
  Administered 2021-06-22: 4 mg via INTRAVENOUS
  Filled 2021-06-22: qty 1

## 2021-06-22 MED ORDER — IOHEXOL 350 MG/ML SOLN
100.0000 mL | Freq: Once | INTRAVENOUS | Status: AC | PRN
  Administered 2021-06-22: 100 mL via INTRAVENOUS

## 2021-06-22 MED ORDER — ASPIRIN EC 81 MG PO TBEC
324.0000 mg | DELAYED_RELEASE_TABLET | Freq: Every day | ORAL | Status: DC
  Administered 2021-06-22: 324 mg via ORAL
  Filled 2021-06-22: qty 4

## 2021-06-22 NOTE — ED Triage Notes (Signed)
Pt reports sharp stabbing left sided chest pain since Monday.  Says was evaluated in ED Tuesday.  Pt says now pain is constant.  Says has had decreased sensation in left arm since Tuesday night .  Reports taking ibuprofen for pain without relief.

## 2021-06-22 NOTE — Telephone Encounter (Signed)
Pt currently in the ED being evaluated for stabbing chest pain. See note below.   Foye Deer, RN   Registered Nurse  Specialty:  Emergency Medicine  ED Triage Notes      Signed  Date of Service:  06/22/2021 10:25 AM           Signed            Pt reports sharp stabbing left sided chest pain since Monday.  Says was evaluated in ED Tuesday.  Pt says now pain is constant.  Says has had decreased sensation in left arm since Tuesday night .  Reports taking ibuprofen for pain without relief.

## 2021-06-22 NOTE — Telephone Encounter (Signed)
Patient does not feel well.  Was seen at emergency room but would like to be seen.  No open appointments.  Please advise.

## 2021-06-22 NOTE — Discharge Instructions (Signed)
You were seen in the emergency department for continued left chest pain and intermittent numbness of your left arm.  You had blood work EKG chest x-ray and a CAT scan of your chest that did not show any obvious explanation for your symptoms.  Please continue your regular medications and follow-up with your primary care doctor and your cardiologist.  Return to the emergency department if any worsening or concerning symptoms

## 2021-06-22 NOTE — ED Notes (Signed)
Patient to X ray at this time.

## 2021-06-22 NOTE — ED Provider Notes (Signed)
[facility] Provider Note   CSN: [id number] Arrival date & time: 06/22/21  1019     History Chief Complaint  Patient presents with   Chest Pain    Andrew Hinton is a 45 y.o. male.  He is here with a complaint of left-sided sharp stabbing chest pain associated with some intermittent left arm numbness.  He said the numbness will last about a minute.  The chest pain has been going on for 4 days.  Denies any trauma.  Endorses some shortness of breath.  Has been trying ibuprofen without any improvement.  He was here 2 days ago for evaluation and had a negative work-up.  Denies any diaphoresis nausea or vomiting.  Has an AICD in but denies having been shocked.  The history is provided by the patient.  Chest Pain Pain location:  L chest Pain quality: sharp and stabbing   Pain severity:  Severe Onset quality:  Sudden Duration:  4 days Timing:  Intermittent Progression:  Unchanged Chronicity:  New Relieved by:  Nothing Worsened by:  Nothing Ineffective treatments: ibuprofen. Associated symptoms: numbness and shortness of breath   Associated symptoms: no abdominal pain, no altered mental status, no back pain, no cough, no diaphoresis, no fever, no headache, no heartburn, no nausea, no syncope and no vomiting   Risk factors: no smoking       Past Medical History:  Diagnosis Date   Anxiety    Asthma    Depression    Nerve damage     Patient Active Problem List   Diagnosis Date Noted   COVID-19 virus infection 08/27/2020   Acute diarrhea 08/27/2020   Dehydration 08/27/2020   Hepatic steatosis Q000111Q   Umbilical hernia Q000111Q   Left inguinal hernia 08/27/2020   Gastroenteritis due to COVID-19 virus 08/27/2020   Chronic combined systolic and diastolic heart failure ([place]) 08/27/2020   Hypotension 08/27/2020   ICD (implantable cardioverter-defibrillator) in place 123XX123   Chronic systolic heart failure ([place]) 03/16/2020   Syncope 10/09/2019    AKI (acute kidney injury) ([place]) 0000000   Acute systolic CHF (congestive heart failure) ([place]) 10/09/2019   Subarachnoid hemorrhage ([place]) 10/08/2019   Adjustment disorder with depressed mood 02/16/2015   Acute pain of left knee 02/01/2015   Allergic rhinitis 01/17/2015    Past Surgical History:  Procedure Laterality Date   APPENDECTOMY     ICD IMPLANT N/A 03/22/2020   Procedure: ICD IMPLANT;  Surgeon: Evans Lance, MD;  Location: [facility];  Service: Cardiovascular;  Laterality: N/A;   RIGHT/LEFT HEART CATH AND CORONARY ANGIOGRAPHY N/A 10/12/2019   Procedure: RIGHT/LEFT HEART CATH AND CORONARY ANGIOGRAPHY;  Surgeon: Larey Dresser, MD;  Location: [facility];  Service: Cardiovascular;  Laterality: N/A;       No family history on file.  Social History   Tobacco Use   Smoking status: Former    Packs/day: 0.50    Types: Cigarettes    Quit date: 10/07/2017    Years since quitting: 3.7   Smokeless tobacco: Never  Vaping Use   Vaping Use: Never used  Substance Use Topics   Alcohol use: Yes    Comment: occasionally    Drug use: No    Home Medications Prior to Admission medications   Medication Sig Start Date End Date Taking? Authorizing Provider  carvedilol (COREG) 12.5 MG tablet Take 1 tablet (12.5 mg total) by mouth 2 (two) times daily with a meal. 05/30/21   Larey Dresser,  MD  digoxin (LANOXIN) 0.125 MG tablet Take 1 tablet (0.125 mg total) by mouth daily. 02/11/20   Larey Dresser, MD  ENTRESTO 97-103 MG TAKE 1 TABLET BY MOUTH TWICE DAILY 08/10/20   Larey Dresser, MD  eplerenone (INSPRA) 50 MG tablet Take 1 tablet (50 mg total) by mouth daily. 05/02/21   Larey Dresser, MD  FARXIGA 10 MG TABS tablet TAKE 1 TABLET(10 MG) BY MOUTH DAILY BEFORE BREAKFAST 05/08/21   Larey Dresser, MD  meclizine (ANTIVERT) 25 MG tablet TAKE 1/2 TABLET(12.5 MG) BY MOUTH THREE TIMES DAILY AS NEEDED FOR DIZZINESS 05/03/21   Evans Lance, MD  meloxicam (MOBIC) 15 MG  tablet Take 1 tablet (15 mg total) by mouth daily. Patient not taking: Reported on 05/30/2021 04/04/21   Mordecai Rasmussen, MD  methocarbamol (ROBAXIN) 500 MG tablet Take 1 tablet (500 mg total) by mouth every 8 (eight) hours as needed for muscle spasms. 04/04/21   Mordecai Rasmussen, MD  sertraline (ZOLOFT) 25 MG tablet Take 25 mg by mouth daily. 07/19/20   [provider]  Vitamin D, Ergocalciferol, (DRISDOL) 1.25 MG (50000 UNIT) CAPS capsule Take 50,000 Units by mouth once a week. 03/30/21   [provider]    Allergies    Spironolactone  Review of Systems   Review of Systems  Constitutional:  Negative for diaphoresis and fever.  HENT:  Negative for sore throat.   Eyes:  Negative for visual disturbance.  Respiratory:  Positive for shortness of breath. Negative for cough.   Cardiovascular:  Positive for chest pain. Negative for syncope.  Gastrointestinal:  Negative for abdominal pain, heartburn, nausea and vomiting.  Genitourinary:  Negative for dysuria.  Musculoskeletal:  Negative for back pain.  Skin:  Negative for rash.  Neurological:  Positive for numbness. Negative for headaches.   Physical Exam Updated Vital Signs BP (!) 145/99 (BP Location: Left Arm)   Pulse 75   Temp 97.7 F (36.5 C) (Oral)   Resp 16   Ht 6' (1.829 m)   Wt 90.3 kg   SpO2 100%   BMI 26.99 kg/m   Physical Exam Vitals and nursing note reviewed.  Constitutional:      Appearance: He is well-developed.  HENT:     Head: Normocephalic and atraumatic.  Eyes:     Conjunctiva/sclera: Conjunctivae normal.  Cardiovascular:     Rate and Rhythm: Normal rate and regular rhythm.     Heart sounds: Normal heart sounds. No murmur heard. Pulmonary:     Effort: Pulmonary effort is normal. No respiratory distress.     Breath sounds: Normal breath sounds.  Abdominal:     Palpations: Abdomen is soft. There is no mass.     Tenderness: There is no abdominal tenderness. There is no guarding or rebound.   Musculoskeletal:        General: Normal range of motion.     Cervical back: Neck supple.     Right lower leg: No tenderness. No edema.     Left lower leg: No tenderness. No edema.  Skin:    General: Skin is warm and dry.  Neurological:     General: No focal deficit present.     Mental Status: He is alert.     Sensory: Sensory deficit present.     Motor: No weakness.     Comments: Subjective decrease sensation left arm.  Normal strength in upper and lower extremities.    ED Results / Procedures / Treatments  Labs (all labs ordered are listed, but only abnormal results are displayed) Labs Reviewed  BASIC METABOLIC PANEL - Abnormal; Notable for the following components:      Result Value   Glucose, Bld 114 (*)    All other components within normal limits  DIGOXIN LEVEL - Abnormal; Notable for the following components:   Digoxin Level <0.2 (*)    All other components within normal limits  CBC  D-DIMER, QUANTITATIVE  TROPONIN I (HIGH SENSITIVITY)  TROPONIN I (HIGH SENSITIVITY)    EKG EKG Interpretation  Date/Time:  Thursday June 22 2021 10:35:16 EST Ventricular Rate:  69 PR Interval:  120 QRS Duration: 97 QT Interval:  433 QTC Calculation: 464 R Axis:   10 Text Interpretation: Sinus rhythm Probable left atrial enlargement Abnormal R-wave progression, early transition Borderline repolarization abnormality No significant change since last tracing Confirmed by Meridee Score [phone]) on 06/22/2021 10:40:52 AM  Radiology DG Chest 2 View  Result Date: 06/22/2021 CLINICAL DATA:  Stabbing left-sided chest pain EXAM: CHEST - 2 VIEW COMPARISON:  Radiograph 06/20/2021 FINDINGS: Unchanged cardiomediastinal silhouette. Unchanged single lead AICD. There is no focal airspace consolidation. There is no large pleural effusion or visible pneumothorax. There is no acute osseous abnormality. IMPRESSION: No evidence of acute cardiopulmonary disease. Electronically Signed   By: Caprice Renshaw M.D.   On: 06/22/2021 11:39   CT Angio Chest/Abd/Pel for Dissection W and/or W/WO  Result Date: 06/22/2021 CLINICAL DATA:  Thoracic aortic aneurysm (TAA) suspected; chest pain left arm numbness EXAM: CT ANGIOGRAPHY CHEST, ABDOMEN AND PELVIS TECHNIQUE: Non-contrast CT of the chest was initially obtained. Multidetector CT imaging through the chest, abdomen and pelvis was performed using the standard protocol during bolus administration of intravenous contrast. Multiplanar reconstructed images and MIPs were obtained and reviewed to evaluate the vascular anatomy. CONTRAST:  OMNIPAQUE IOHEXOL 350 MG/ML SOLN COMPARISON:  CT abdomen January 2022, CT chest March 2021 FINDINGS: CTA CHEST FINDINGS Cardiovascular: No evidence of thoracic aorta intramural hematoma or dissection. Aortic caliber is normal. There is no central pulmonary embolus. Heart size is normal. No pericardial effusion. Left chest wall single lead ICD. Mediastinum/Nodes: No enlarged nodes. Included thyroid is unremarkable. Esophagus is unremarkable. Lungs/Pleura: No pleural effusion or pneumothorax. No consolidation or mass. Musculoskeletal: No acute osseous abnormality. Chronic right rib fractures. Review of the MIP images confirms the above findings. CTA ABDOMEN AND PELVIS FINDINGS VASCULAR Aorta: Normal caliber aorta without aneurysm, dissection, vasculitis or significant stenosis. Celiac: Patent without origin stenosis. SMA: Patent without origin stenosis. Renals: Patent without origin stenosis. IMA: Patent without origin stenosis. Inflow: Patent without stenosis. Veins: Not well evaluated. Review of the MIP images confirms the above findings. NON-VASCULAR Hepatobiliary: Small focus of hyperenhancement at the posterior right lobe likely reflects a flash filling hemangioma. Gallbladder is unremarkable. No biliary dilatation. Pancreas: Unremarkable. Spleen: Unremarkable. Adrenals/Urinary Tract: Adrenals, kidneys, and bladder are  unremarkable. Stomach/Bowel: Stomach is within normal limits. Bowel is normal in caliber. Lymphatic: No enlarged nodes. Reproductive: Unremarkable. Other: No free fluid.  Abdominal wall is unremarkable. Musculoskeletal: Mild lower lumbar degenerative changes. No acute osseous abnormality. Review of the MIP images confirms the above findings. IMPRESSION: No evidence of aortic dissection or other acute abnormality. Electronically Signed   By: Guadlupe Spanish M.D.   On: 06/22/2021 12:13    Procedures Procedures   Medications Ordered in ED Medications  morphine 4 MG/ML injection 4 mg (4 mg Intravenous Given 06/22/21 1047)  iohexol (OMNIPAQUE) 350 MG/ML injection 100 mL (100 mLs Intravenous  Contrast Given 06/22/21 1156)    ED Course  I have reviewed the triage vital signs and the nursing notes.  Pertinent labs & imaging results that were available during my care of the patient were reviewed by me and considered in my medical decision making (see chart for details).  Clinical Course as of 06/22/21 2039  Thu Jun 22, 2021  1143 Chest x-ray does not show any acute pulmonary disease. [MB]  [id number] Patient reassessed and looks more comfortable.  His cardiac work-up has been unremarkable.  CT angio chest does not show any evidence of dissection or other acute findings.  Reviewed this with patient.  He is comfortable plan for outpatient follow-up with his treating providers.  Return instructions discussed [MB]    Clinical Course User Index [MB] Hayden Rasmussen, MD   MDM Rules/Calculators/A&P                          This patient complains of sharp stabbing left-sided chest pain intermittent left arm numbness; this involves an extensive number of treatment Options and is a complaint that carries with it a high risk of complications and Morbidity. The differential includes ACS, pneumonia, pneumothorax, PE, dissection, musculoskeletal, peripheral nerve  I ordered, reviewed and interpreted labs, which  included CBC with normal white count normal hemoglobin, chemistries normal with a mildly elevated glucose, delta troponins flat, D-dimer negative, digoxin undetectable I ordered medication oral aspirin and IV pain medication with improvement in his symptoms I ordered imaging studies which included chest x-ray and CT angio dissection study and I independently    visualized and interpreted imaging which showed no acute findings Previous records obtained and reviewed in epic including recent ED visit for similar presentation.  After the interventions stated above, I reevaluated the patient and found patient to be symptomatically improved.  Vitals remained hemodynamically stable.  Recommended close follow-up with his treating providers.  Return instructions discussed.   Final Clinical Impression(s) / ED Diagnoses Final diagnoses:  Nonspecific chest pain    Rx / DC Orders ED Discharge Orders     None        Hayden Rasmussen, MD 06/22/21 2041

## 2021-06-30 ENCOUNTER — Ambulatory Visit: Payer: Medicaid Other | Admitting: Orthopedic Surgery

## 2021-06-30 ENCOUNTER — Other Ambulatory Visit: Payer: Self-pay

## 2021-06-30 ENCOUNTER — Encounter: Payer: Self-pay | Admitting: Orthopedic Surgery

## 2021-06-30 DIAGNOSIS — M7582 Other shoulder lesions, left shoulder: Secondary | ICD-10-CM

## 2021-06-30 NOTE — Progress Notes (Signed)
New Patient Visit  Assessment: Trentan Trippe is a 45 y.o. RHD male with the following: Left rotator cuff tendinitis; concern for rotator cuff injury  Plan: Patient had excellent relief from his previous injection.  He would like to proceed with another injection.  We discussed this pain, and limitations.  I also warned him that subsequent injections sometimes are not as effective.  He stated his understanding.  If this injection does not provide sustained relief, I do think we should proceed with an MRI to find out what has been injured.  He stated his understanding, and is in agreement.   Procedure note injection Left shoulder    Verbal consent was obtained to inject the left shoulder, subacromial space Timeout was completed to confirm the site of injection.  The skin was prepped with alcohol and ethyl chloride was sprayed at the injection site.  A 21-gauge needle was used to inject 40 mg of Depo-Medrol and 1% lidocaine (3 cc) into the subacromial space of the left shoulder using a posterolateral approach.  There were no complications. A sterile bandage was applied.    Follow-up: Return if symptoms worsen or fail to improve.  Subjective:  Chief Complaint  Patient presents with   Shoulder Pain    LEFT  Patient would like injection   Injections    LT shoulder    History of Present Illness: Barrett Goldie is a 45 y.o. male who presents for evaluation of left shoulder pain.  His last injection was approximately 3 months ago.  It provided excellent relief until approximately 1 week ago.  He is interested in proceeding with another injection today.  Pain is primarily in the posterior aspect of his shoulder.  No recent injuries.  He does have some difficulty with overhead motion.  Review of Systems: No fevers or chills No numbness or tingling No chest pain No shortness of breath No bowel or bladder dysfunction No GI distress No headaches   Objective: There were no  vitals taken for this visit.  Physical Exam:  General: Alert and oriented. and No acute distress. Gait: Normal gait.  Evaluation left shoulder demonstrates no deformity.  No atrophy is appreciated.  He has exquisite tenderness to palpation within the trapezius muscle.  Forward flexion 90 degrees.  Abduction to 75 degrees.  Passively, he has full forward flexion.  Some pain with drop arm testing.  Pain in the empty can testing position.  Negative O'Brien's.  Negative belly press.  Fingers are warm and well-perfused.  2+ radial pulse.  IMAGING: I personally reviewed images previously obtained from the ED  No new imaging today.  New Medications:  No orders of the defined types were placed in this encounter.     Oliver Barre, MD  06/30/2021 9:22 AM

## 2021-06-30 NOTE — Patient Instructions (Signed)

## 2021-07-11 ENCOUNTER — Ambulatory Visit (INDEPENDENT_AMBULATORY_CARE_PROVIDER_SITE_OTHER): Payer: Medicaid Other | Admitting: Internal Medicine

## 2021-07-11 ENCOUNTER — Other Ambulatory Visit: Payer: Self-pay

## 2021-07-11 VITALS — BP 118/72 | HR 89 | Ht 72.0 in | Wt 198.2 lb

## 2021-07-11 DIAGNOSIS — Z95810 Presence of automatic (implantable) cardiac defibrillator: Secondary | ICD-10-CM

## 2021-07-11 DIAGNOSIS — I5022 Chronic systolic (congestive) heart failure: Secondary | ICD-10-CM | POA: Diagnosis not present

## 2021-07-11 NOTE — Patient Instructions (Addendum)
Medication Instructions:  Your physician recommends that you continue on your current medications as directed. Please refer to the Current Medication list given to you today.  Labwork: None ordered.  Testing/Procedures: None ordered.  Follow-Up: Your physician wants you to follow-up in: one year with Casimiro Needle "Mardelle Matte" Lanna Poche, PA-C  Remote monitoring is used to monitor your ICD from home. This monitoring reduces the number of office visits required to check your device to one time per year. It allows [place] to keep an eye on the functioning of your device to ensure it is working properly. You are scheduled for a device check from home on 09/19/2021. You may send your transmission at any time that day. If you have a wireless device, the transmission will be sent automatically. After your physician reviews your transmission, you will receive a postcard with your next transmission date.  Any Other Special Instructions Will Be Listed Below (If Applicable).  If you need a refill on your cardiac medications before your next appointment, please call your pharmacy.

## 2021-07-11 NOTE — Progress Notes (Signed)
HPI Mr. Kessner returns today for followup after undergoing ICD insertion. He is a pleasant 45 yo man with a h/o chronic systolic heart failure, EF 30%, and remote ETOH abuse. He has felt well since his ICD was inserted and denies any ICD therapies. He is back to work as a Curator.  Allergies  Allergen Reactions   Spironolactone     Gynecomastia/breast tenderness     Current Outpatient Medications  Medication Sig Dispense Refill   carvedilol (COREG) 12.5 MG tablet Take 1 tablet (12.5 mg total) by mouth 2 (two) times daily with a meal. 60 tablet 11   digoxin (LANOXIN) 0.125 MG tablet Take 1 tablet (0.125 mg total) by mouth daily. 90 tablet 3   ENTRESTO 97-103 MG TAKE 1 TABLET BY MOUTH TWICE DAILY 60 tablet 5   eplerenone (INSPRA) 50 MG tablet Take 1 tablet (50 mg total) by mouth daily. 90 tablet 3   FARXIGA 10 MG TABS tablet TAKE 1 TABLET(10 MG) BY MOUTH DAILY BEFORE BREAKFAST 30 tablet 11   meclizine (ANTIVERT) 25 MG tablet TAKE 1/2 TABLET(12.5 MG) BY MOUTH THREE TIMES DAILY AS NEEDED FOR DIZZINESS 30 tablet 6   meloxicam (MOBIC) 15 MG tablet Take 1 tablet (15 mg total) by mouth daily. 30 tablet 0   methocarbamol (ROBAXIN) 500 MG tablet Take 500 mg by mouth every 8 (eight) hours as needed for muscle spasms.     sertraline (ZOLOFT) 25 MG tablet Take 25 mg by mouth daily.     Vitamin D, Ergocalciferol, (DRISDOL) 1.25 MG (50000 UNIT) CAPS capsule Take 50,000 Units by mouth once a week.     No current facility-administered medications for this visit.     Past Medical History:  Diagnosis Date   Anxiety    Asthma    Depression    Nerve damage     ROS:   All systems reviewed and negative except as noted in the HPI.   Past Surgical History:  Procedure Laterality Date   APPENDECTOMY     ICD IMPLANT N/A 03/22/2020   Procedure: ICD IMPLANT;  Surgeon: Marinus Maw, MD;  [facility];  Service: Cardiovascular;  Laterality: N/A;   RIGHT/LEFT HEART CATH AND  CORONARY ANGIOGRAPHY N/A 10/12/2019   Procedure: RIGHT/LEFT HEART CATH AND CORONARY ANGIOGRAPHY;  Surgeon: Laurey Morale, MD;  Location: [facility];  Service: Cardiovascular;  Laterality: N/A;     No family history on file.   Social History   Socioeconomic History   Marital status: Divorced    Spouse name: Not on file   Number of children: 2   Years of education: Not on file   Highest education level: Not on file  Occupational History   Not on file  Tobacco Use   Smoking status: Former    Packs/day: 0.50    Types: Cigarettes    Quit date: 10/07/2017    Years since quitting: 3.7   Smokeless tobacco: Never  Vaping Use   Vaping Use: Never used  Substance and Sexual Activity   Alcohol use: Yes    Comment: occasionally    Drug use: No   Sexual activity: Not on file  Other Topics Concern   Not on file  Social History Narrative   Not on file   Social Determinants of Health   Financial Resource Strain: Not on file  Food Insecurity: Not on file  Transportation Needs: Not on file  Physical Activity: Not on file  Stress: Not on file  Social Connections: Not on file  Intimate Partner Violence: Not on file     BP 118/72   Pulse 89   Ht 6' (1.829 m)   Wt 198 lb 3.2 oz (89.9 kg)   SpO2 97%   BMI 26.88 kg/m   Physical Exam:  Well appearing NAD HEENT: Unremarkable Neck:  No JVD, no thyromegally Lymphatics:  No adenopathy Back:  No CVA tenderness Lungs:  Clear HEART:  Regular rate rhythm, no murmurs, no rubs, no clicks Abd:  soft, positive bowel sounds, no organomegally, no rebound, no guarding Ext:  2 plus pulses, no edema, no cyanosis, no clubbing Skin:  No rashes no nodules Neuro:  CN II through XII intact, motor grossly intact  DEVICE  Normal device function.  See PaceArt for details.   Assess/Plan:  1. Chronic systolic heart failure - his symptoms are class 2 on maximal medical therapy. He will continue current GDMT. 2. ICD - his [employer]  single chamber ICD is working normally. No change in meds. 3. Remote ETOH - he is abstaining.    Sharlot Gowda Adalynd Donahoe,MD

## 2021-07-31 ENCOUNTER — Telehealth (HOSPITAL_COMMUNITY): Payer: Self-pay

## 2021-07-31 NOTE — Progress Notes (Signed)
PCP: [facility] Primary Cardiologist: Dr. Shirlee Latch   HPI: Andrew Hinton is a 45 y.o. with history of anxiety, asthma, and depression. Previously a heavy drinker and smoker.  No longer has driver's license due to multiple DWIs.    Admitted 10/08/19 after syncopal episode and dyspnea with exertion. Had small SAH and rib fracture. Had echo that showed severely reduced EF, new onset acute systolic heart failure. Cardiac cath was negative for coronary disease, and showed low filling pressures and adequate cardiac output. Discharged with Life Vest given concern that syncope was arrhythmic. Started low dose HF meds.   Was evaluated at [facility] ED in 3/21 due to dyspnea and rib pain. New rib fracture was noted on the left. SAH had resolved. CXR and CTA negative. He was discharged to home.   Echo in 6/21 showed that EF remained 30%. Now with [employer] ICD in 8/21.  Echo in 1/22 showed EF 25-30%.    He returns today for followup of CHF.  Main complaint is a rotator cuff injury on the left.  He is not smoking.  He started drinking again due to shoulder pain but quit again about 2-3 weeks ago. No significant exertional dyspnea or chest pain.  He is working full time as a Curator.  No lightheadedness.  Taking all his meds. Weight down 11 lbs.   Seen in ED for CP 11/22, cardiac work up negative. He attributed pain to MSK from lifting heavy equipment at work.  Today he returns for HF follow up. He works fulltime as a Curator. No SOB at work, main complaint is rotator cuff injury. He gets steroid injections every few months, may need surgery down the road. Overall feeling fine. Denies increasing SOB, CP, dizziness, edema, or PND/Orthopnea. Appetite ok. No fever or chills. Weight at home 200-201 pounds. Taking all medications.   ECG (personally reviewed): none ordered today  [employer] device interrogation: Heartlogic score 0, 5.6 hrs daily activity, 84 bpm, no shocks (personally reviewed).    Labs (6/21): K 4.7, creatinine 1.15 Labs (7/22): K 4.5, creatinine 1.01 Labs (11/22): K 4.0, creatinine 1.06  PMH: 1. Anxiety 2. Asthma 3. H/o ETOH abuse.  4. Chronic systolic CHF: Echo (2/21) with EF < 20%, normal RV.  Nonischemic cardiomyopathy.  - RHC/LHC (3/21): No significant CAD; mean RA 3, PA 24/5, mean PCWP 3, CI 2.3.  - Cardiac MRI (3/21): EF 25%, normal RV, no LGE.  - Echo (6/21): EF 30%, diffuse hypokinesis, normal RV, normal IVC.  [employer] ICD.  - Echo (1/22): EF 25-30% 5. Syncope: Has ICD 6. Traumatic SAH in 2/21.  7. COVID-19 1/22  SH: Does not drive. No longer smokes. Stopped drinking in 2/21, prior heavy use. Lives with son, mother in law, and father in Social worker. Difficulty paying for medications. Work on cars full time.    FH: Mom and 2 sister have heart disease. He has not idea what kind of heart problems. Mother deceased ? Cancer.   ROS: All systems negative except as listed in HPI, PMH and Problem List.   Current Outpatient Medications  Medication Sig Dispense Refill   carvedilol (COREG) 12.5 MG tablet Take 1 tablet (12.5 mg total) by mouth 2 (two) times daily with a meal. 60 tablet 11   digoxin (LANOXIN) 0.125 MG tablet Take 1 tablet (0.125 mg total) by mouth daily. 90 tablet 3   ENTRESTO 97-103 MG TAKE 1 TABLET BY MOUTH TWICE DAILY 60 tablet 5   eplerenone (INSPRA) 50  MG tablet Take 1 tablet (50 mg total) by mouth daily. 90 tablet 3   FARXIGA 10 MG TABS tablet TAKE 1 TABLET(10 MG) BY MOUTH DAILY BEFORE BREAKFAST 30 tablet 11   meclizine (ANTIVERT) 25 MG tablet TAKE 1/2 TABLET(12.5 MG) BY MOUTH THREE TIMES DAILY AS NEEDED FOR DIZZINESS 30 tablet 6   meloxicam (MOBIC) 15 MG tablet Take 1 tablet (15 mg total) by mouth daily. 30 tablet 0   methocarbamol (ROBAXIN) 500 MG tablet Take 500 mg by mouth every 8 (eight) hours as needed for muscle spasms.     sertraline (ZOLOFT) 25 MG tablet Take 25 mg by mouth daily.     Vitamin D, Ergocalciferol, (DRISDOL)  1.25 MG (50000 UNIT) CAPS capsule Take 50,000 Units by mouth once a week.     No current facility-administered medications for this encounter.   Wt Readings from Last 3 Encounters:  08/01/21 90.5 kg (199 lb 9.6 oz)  07/11/21 89.9 kg (198 lb 3.2 oz)  06/22/21 90.3 kg (199 lb)   BP 120/74    Pulse 86    Wt 90.5 kg (199 lb 9.6 oz)    SpO2 98%    BMI 27.07 kg/m  Physical Exam: General:  NAD. No resp difficulty HEENT: Normal Neck: Supple. No JVD. Carotids 2+ bilat; no bruits. No lymphadenopathy or thryomegaly appreciated. Cor: PMI nondisplaced. Regular rate & rhythm. No rubs, gallops or murmurs. Lungs: Clear Abdomen: Soft, nontender, nondistended. No hepatosplenomegaly. No bruits or masses. Good bowel sounds. Extremities: No cyanosis, clubbing, rash, edema Neuro: Alert & oriented x 3, cranial nerves grossly intact. Moves all 4 extremities w/o difficulty. Affect pleasant.  Assessment/Plan: 1. Chronic Systolic HF:  Nonischemic cardiomyopathy. Echo in 2/21 showed LV dilation with EF < 20%, the RV appeared normal. Given the LV dilation, I suspected that the cardiomyopathy was not new (probably less likely to be a stress cardiomyopathy related to the subarachnoid hemorrhage). 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  Cannot rule out viral cardiomyopathy.  He has multiple family members with "heart disease" but he does not have details. Coronary angiography did not show any significant CAD.  Cardiac MRI showed no infiltrative disease, no prior MI, EF 25%, RV normal.  Echo in 6/21 showed EF 30% with diffuse hypokinesis, mild LV dilation.  Echo in 1/22 with EF 25-30%.  He had a [employer] ICD placed.  NYHA class I symptoms, he is not volume overloaded on exam or by Heartlogic.   - Continue Entresto 97/103 mg bid. BMET today. - Continue digoxin 0.125 mg daily. Check level today, consider stopping if remains NYHA I. - Continue eplerenone 50 mg daily. - Continue Coreg 12.5 mg bid. -  Continue Farxiga 10 mg daily.  2. Syncope: Initial presentation with syncope, no prodrome.  Given low EF, ?arrhythmic event.  He now has ICD. No further events. 3. SAH: Small volume SAH post-syncope in 2/21.  4. ETOH abuse: I strongly encouraged him to remain abstinent.  Suspect this played a role in his cardiomyopathy.   Followup with Dr. Shirlee Latch 3-4 months.  [facility] FNP 08/01/2021

## 2021-07-31 NOTE — Telephone Encounter (Signed)
Called to confirm/remind patient of their appointment at the [facility] on 08/01/21.   Patient reminded to bring all medications and/or complete list.  Confirmed patient has transportation. Gave directions, instructed to utilize valet parking.  Confirmed appointment prior to ending call.

## 2021-08-01 ENCOUNTER — Ambulatory Visit (HOSPITAL_COMMUNITY)
Admission: RE | Admit: 2021-08-01 | Discharge: 2021-08-01 | Disposition: A | Discharge status: Home / Self Care | Payer: Medicaid Other | Source: Ambulatory Visit | Attending: Family Medicine | Admitting: Family Medicine

## 2021-08-01 ENCOUNTER — Ambulatory Visit (INDEPENDENT_AMBULATORY_CARE_PROVIDER_SITE_OTHER): Payer: Medicaid Other

## 2021-08-01 ENCOUNTER — Encounter (HOSPITAL_COMMUNITY): Payer: Self-pay

## 2021-08-01 ENCOUNTER — Other Ambulatory Visit: Payer: Self-pay

## 2021-08-01 VITALS — BP 120/74 | HR 86 | Wt 199.6 lb

## 2021-08-01 DIAGNOSIS — I428 Other cardiomyopathies: Secondary | ICD-10-CM | POA: Insufficient documentation

## 2021-08-01 DIAGNOSIS — Z09 Encounter for follow-up examination after completed treatment for conditions other than malignant neoplasm: Secondary | ICD-10-CM | POA: Diagnosis not present

## 2021-08-01 DIAGNOSIS — S43422A Sprain of left rotator cuff capsule, initial encounter: Secondary | ICD-10-CM | POA: Insufficient documentation

## 2021-08-01 DIAGNOSIS — Y939 Activity, unspecified: Secondary | ICD-10-CM | POA: Diagnosis not present

## 2021-08-01 DIAGNOSIS — Z8679 Personal history of other diseases of the circulatory system: Secondary | ICD-10-CM

## 2021-08-01 DIAGNOSIS — R55 Syncope and collapse: Secondary | ICD-10-CM | POA: Diagnosis not present

## 2021-08-01 DIAGNOSIS — F1010 Alcohol abuse, uncomplicated: Secondary | ICD-10-CM | POA: Insufficient documentation

## 2021-08-01 DIAGNOSIS — Z7984 Long term (current) use of oral hypoglycemic drugs: Secondary | ICD-10-CM | POA: Insufficient documentation

## 2021-08-01 DIAGNOSIS — Z95810 Presence of automatic (implantable) cardiac defibrillator: Secondary | ICD-10-CM | POA: Diagnosis not present

## 2021-08-01 DIAGNOSIS — M25519 Pain in unspecified shoulder: Secondary | ICD-10-CM | POA: Insufficient documentation

## 2021-08-01 DIAGNOSIS — I5022 Chronic systolic (congestive) heart failure: Secondary | ICD-10-CM | POA: Diagnosis present

## 2021-08-01 DIAGNOSIS — F32A Depression, unspecified: Secondary | ICD-10-CM | POA: Insufficient documentation

## 2021-08-01 DIAGNOSIS — Z87891 Personal history of nicotine dependence: Secondary | ICD-10-CM | POA: Insufficient documentation

## 2021-08-01 DIAGNOSIS — Z8616 Personal history of COVID-19: Secondary | ICD-10-CM | POA: Insufficient documentation

## 2021-08-01 DIAGNOSIS — F419 Anxiety disorder, unspecified: Secondary | ICD-10-CM | POA: Diagnosis not present

## 2021-08-01 DIAGNOSIS — Y929 Unspecified place or not applicable: Secondary | ICD-10-CM | POA: Insufficient documentation

## 2021-08-01 DIAGNOSIS — J45909 Unspecified asthma, uncomplicated: Secondary | ICD-10-CM | POA: Insufficient documentation

## 2021-08-01 DIAGNOSIS — Z79899 Other long term (current) drug therapy: Secondary | ICD-10-CM | POA: Insufficient documentation

## 2021-08-01 DIAGNOSIS — Z7901 Long term (current) use of anticoagulants: Secondary | ICD-10-CM | POA: Diagnosis not present

## 2021-08-01 LAB — CUP PACEART REMOTE DEVICE CHECK
Battery Remaining Longevity: 180 mo
Battery Remaining Percentage: 100 %
Brady Statistic RV Percent Paced: 0 %
Date Time Interrogation Session: 20221220112600
HighPow Impedance: 89 Ohm
Implantable Lead Implant Date: 20210810
Implantable Lead Location: 753860
Implantable Lead Model: 273
Implantable Lead Serial Number: 111079
Implantable Pulse Generator Implant Date: 20210810
Lead Channel Impedance Value: 522 Ohm
Lead Channel Setting Pacing Amplitude: 2.5 V
Lead Channel Setting Pacing Pulse Width: 0.4 ms
Lead Channel Setting Sensing Sensitivity: 0.5 mV
Pulse Gen Serial Number: 278671

## 2021-08-01 LAB — BASIC METABOLIC PANEL
Anion gap: 6 (ref 5–15)
BUN: 21 mg/dL — ABNORMAL HIGH (ref 6–20)
CO2: 25 mmol/L (ref 22–32)
Calcium: 9.4 mg/dL (ref 8.9–10.3)
Chloride: 107 mmol/L (ref 98–111)
Creatinine, Ser: 1.1 mg/dL (ref 0.61–1.24)
GFR, Estimated: >60 mL/min (ref >60)
Glucose, Bld: 99 mg/dL (ref 70–99)
Potassium: 5 mmol/L (ref 3.5–5.1)
Sodium: 138 mmol/L (ref 135–145)

## 2021-08-01 LAB — DIGOXIN LEVEL: Digoxin Level: 0.3 ng/mL — ABNORMAL LOW (ref 0.8–2.0)

## 2021-08-01 NOTE — Patient Instructions (Signed)
Medication Changes:  None  Lab Work:  Labs today We will only contact you if something comes back abnormal or we need to make some changes. Otherwise no news is good news!   Follow-Up in:  3-4 months with Dr. Shirlee Latch  At the [facility], you and your health needs are our priority. We have a designated team specialized in the treatment of Heart Failure. This Care Team includes your primary Heart Failure Specialized Cardiologist (physician), Advanced Practice Providers (APPs- Physician Assistants and Nurse Practitioners), and Pharmacist who all work together to provide you with the care you need, when you need it.   You may see any of the following providers on your designated Care Team at your next follow up:  Dr Arvilla Meres Dr Carron Curie, NP Robbie Lis, [place] [facility] [place], [place] Karle Plumber, PharmD   Please be sure to bring in all your medications bottles to every appointment.   Need to Contact us:  If you have any questions or concerns before your next appointment please send [place] a message through [place] or call our office at [phone].    TO LEAVE A MESSAGE FOR THE NURSE SELECT OPTION 2, PLEASE LEAVE A MESSAGE INCLUDING: YOUR NAME DATE OF BIRTH CALL BACK NUMBER REASON FOR CALL**this is important as we prioritize the call backs  YOU WILL RECEIVE A CALL BACK THE SAME DAY AS LONG AS YOU CALL BEFORE 4:00 PM

## 2021-08-10 NOTE — Progress Notes (Signed)
Remote ICD transmission.   

## 2021-08-12 ENCOUNTER — Other Ambulatory Visit (HOSPITAL_COMMUNITY): Payer: Self-pay | Admitting: Cardiology

## 2021-08-17 ENCOUNTER — Telehealth (HOSPITAL_COMMUNITY): Payer: Self-pay | Admitting: Pharmacist

## 2021-08-17 NOTE — Telephone Encounter (Signed)
Advanced Heart Failure Patient Advocate Encounter  Prior Authorization for Andrew Hinton has been approved.     Effective dates: 08/03/21 through 08/17/22  Karle Plumber, PharmD, BCPS, BCCP, CPP Heart Failure Clinic Pharmacist [phone]

## 2021-08-17 NOTE — Telephone Encounter (Signed)
Patient Advocate Encounter   Received notification from [facility] that prior authorization for Sherryll Burger is required.   PA submitted on CoverMyMeds Key [id number] Status is pending   Will continue to follow.   Karle Plumber, PharmD, BCPS, BCCP, CPP Heart Failure Clinic Pharmacist [phone]

## 2021-08-19 ENCOUNTER — Other Ambulatory Visit (HOSPITAL_COMMUNITY): Payer: Self-pay | Admitting: Cardiology

## 2021-08-19 DIAGNOSIS — I5022 Chronic systolic (congestive) heart failure: Secondary | ICD-10-CM

## 2021-08-19 DIAGNOSIS — I38 Endocarditis, valve unspecified: Secondary | ICD-10-CM

## 2021-10-05 ENCOUNTER — Telehealth (HOSPITAL_COMMUNITY): Payer: Self-pay | Admitting: Cardiology

## 2021-10-30 NOTE — Progress Notes (Incomplete)
?PCP: [facility] ?HF Cardiologist: Dr. Aundra Dubin  ? ?HPI: ?[name] is a 46 y.o. with history of anxiety, asthma, and depression. Previously a heavy drinker and smoker.  No longer has driver's license due to multiple DWIs.  ?  ?Admitted 10/08/19 after syncopal episode and dyspnea with exertion. Had small SAH and rib fracture. Had echo that showed severely reduced EF, new onset acute systolic heart failure. Cardiac cath was negative for coronary disease, and showed low filling pressures and adequate cardiac output. Discharged with Life Vest given concern that syncope was arrhythmic. Started low dose HF meds.  ? ?Was evaluated at [facility] ED in 3/21 due to dyspnea and rib pain. New rib fracture was noted on the left. SAH had resolved. CXR and CTA negative. He was discharged to home.  ? ?Echo in 6/21 showed that EF remained 30%. Now with [employer] ICD in 8/21.  Echo in 1/22 showed EF 25-30%.   ? ?Seen in ED for CP 11/22, cardiac work up negative. He attributed pain to MSK from lifting heavy equipment at work. ? ?Today he returns for HF follow up. He works fulltime as a Dealer. No SOB at work, main complaint is rotator cuff injury. He gets steroid injections every few months, may need surgery down the road. Overall feeling fine. Denies increasing SOB, CP, dizziness, edema, or PND/Orthopnea. Appetite ok. No fever or chills. Weight at home 200-201 pounds. Taking all medications.  ? ?ECG (personally reviewed): none ordered today ? ?[employer] device interrogation: Heartlogic score 0, 5.6 hrs daily activity, 84 bpm, no shocks (personally reviewed).  ? ?Labs (6/21): K 4.7, creatinine 1.15 ?Labs (7/22): K 4.5, creatinine 1.01 ?Labs (11/22): K 4.0, creatinine 1.06 ?Labs (12/22): K 5.0, creatinine 1.1 ? ?PMH: ?1. Anxiety ?2. Asthma ?3. H/o ETOH abuse.  ?4. Chronic systolic CHF: Echo (123XX123) with EF < 20%, normal RV.  Nonischemic cardiomyopathy.  ?- RHC/LHC (3/21): No significant CAD; mean RA 3, PA 24/5,  mean PCWP 3, CI 2.3.  ?- Cardiac MRI (3/21): EF 25%, normal RV, no LGE.  ?- Echo (6/21): EF 30%, diffuse hypokinesis, normal RV, normal IVC.  ?- [place].  ?- Echo (1/22): EF 25-30% ?5. Syncope: Has ICD ?6. Traumatic SAH in 2/21.  ?7. COVID-19 1/22 ? ?SH: Does not drive. No longer smokes. Stopped drinking in 2/21, prior heavy use. Lives with son, mother in law, and father in Sports coach. Difficulty paying for medications. Work on cars full time.  ?  ?FH: Mom and 2 sister have heart disease. He has not idea what kind of heart problems. Mother deceased ? Cancer.  ? ?ROS: All systems negative except as listed in HPI, PMH and Problem List. ? ? ?Current Outpatient Medications  ?Medication Sig Dispense Refill  ? carvedilol (COREG) 12.5 MG tablet Take 1 tablet (12.5 mg total) by mouth 2 (two) times daily with a meal. 60 tablet 11  ? digoxin (LANOXIN) 0.125 MG tablet TAKE 1 TABLET(0.125 MG) BY MOUTH DAILY 90 tablet 3  ? ENTRESTO 97-103 MG TAKE 1 TABLET BY MOUTH TWICE DAILY 60 tablet 5  ? eplerenone (INSPRA) 50 MG tablet Take 1 tablet (50 mg total) by mouth daily. 90 tablet 3  ? FARXIGA 10 MG TABS tablet TAKE 1 TABLET(10 MG) BY MOUTH DAILY BEFORE BREAKFAST 30 tablet 11  ? meclizine (ANTIVERT) 25 MG tablet TAKE 1/2 TABLET(12.5 MG) BY MOUTH THREE TIMES DAILY AS NEEDED FOR DIZZINESS 30 tablet 6  ? meloxicam (MOBIC) 15 MG tablet Take 1  tablet (15 mg total) by mouth daily. 30 tablet 0  ? methocarbamol (ROBAXIN) 500 MG tablet Take 500 mg by mouth every 8 (eight) hours as needed for muscle spasms.    ? sertraline (ZOLOFT) 25 MG tablet Take 25 mg by mouth daily.    ? Vitamin D, Ergocalciferol, (DRISDOL) 1.25 MG (50000 UNIT) CAPS capsule Take 50,000 Units by mouth once a week.    ? ?No current facility-administered medications for this visit.  ? ?Wt Readings from Last 3 Encounters:  ?08/01/21 90.5 kg (199 lb 9.6 oz)  ?07/11/21 89.9 kg (198 lb 3.2 oz)  ?06/22/21 90.3 kg (199 lb)  ? ?There were no vitals taken for this  visit. ?Physical Exam: ?General:  NAD. No resp difficulty ?HEENT: Normal ?Neck: Supple. No JVD. Carotids 2+ bilat; no bruits. No lymphadenopathy or thryomegaly appreciated. ?Cor: PMI nondisplaced. Regular rate & rhythm. No rubs, gallops or murmurs. ?Lungs: Clear ?Abdomen: Soft, nontender, nondistended. No hepatosplenomegaly. No bruits or masses. Good bowel sounds. ?Extremities: No cyanosis, clubbing, rash, edema ?Neuro: Alert & oriented x 3, cranial nerves grossly intact. Moves all 4 extremities w/o difficulty. Affect pleasant. ? ?Assessment/Plan: ?1. Chronic Systolic HF:  Nonischemic cardiomyopathy. Echo in 2/21 showed LV dilation with EF < 20%, the RV appeared normal. Given the LV dilation, I suspected that the cardiomyopathy was not new (probably less likely to be a stress cardiomyopathy related to the subarachnoid hemorrhage). 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  Cannot rule out viral cardiomyopathy.  He has multiple family members with "heart disease" but he does not have details. Coronary angiography did not show any significant CAD.  Cardiac MRI showed no infiltrative disease, no prior MI, EF 25%, RV normal.  Echo in 6/21 showed EF 30% with diffuse hypokinesis, mild LV dilation.  Echo in 1/22 with EF 25-30%.  He had a [place] placed.  NYHA class I symptoms, he is not volume overloaded on exam or by Heartlogic.   ?- Continue Entresto 97/103 mg bid. BMET today. ?- Continue digoxin 0.125 mg daily. Check level today, consider stopping if remains NYHA I. ?- Continue eplerenone 50 mg daily. ?- Continue Coreg 12.5 mg bid. ?- Continue Farxiga 10 mg daily.  ?2. Syncope: Initial presentation with syncope, no prodrome.  Given low EF, ?arrhythmic event.  He now has ICD. No further events. ?3. SAH: Small volume SAH post-syncope in 2/21.  ?4. ETOH abuse: I strongly encouraged him to remain abstinent.  Suspect this played a role in his cardiomyopathy.  ? ?Followup with Dr. Aundra Dubin 3-4  months. ? ?Rafael Bihari FNP ?10/30/2021 ?

## 2021-10-31 ENCOUNTER — Encounter (HOSPITAL_COMMUNITY): Payer: Medicaid Other

## 2021-10-31 ENCOUNTER — Encounter (HOSPITAL_COMMUNITY): Payer: Medicaid Other | Admitting: Cardiology

## 2021-11-02 ENCOUNTER — Encounter (HOSPITAL_COMMUNITY): Payer: Medicaid Other

## 2021-11-06 ENCOUNTER — Other Ambulatory Visit: Payer: Self-pay

## 2021-11-06 ENCOUNTER — Emergency Department (HOSPITAL_COMMUNITY)
Admission: EM | Admit: 2021-11-06 | Discharge: 2021-11-06 | Disposition: A | Discharge status: Home / Self Care | Payer: Medicaid Other | Attending: Emergency Medicine | Admitting: Emergency Medicine

## 2021-11-06 ENCOUNTER — Encounter (HOSPITAL_COMMUNITY): Payer: Self-pay | Admitting: Emergency Medicine

## 2021-11-06 DIAGNOSIS — K047 Periapical abscess without sinus: Secondary | ICD-10-CM | POA: Diagnosis not present

## 2021-11-06 DIAGNOSIS — R6884 Jaw pain: Secondary | ICD-10-CM | POA: Diagnosis present

## 2021-11-06 MED ORDER — PENICILLIN V POTASSIUM 500 MG PO TABS: 500.0000 mg | ORAL_TABLET | Freq: Four times a day (QID) | ORAL | 0 refills | Status: AC

## 2021-11-06 NOTE — ED Triage Notes (Signed)
Pt to the ED with right side facial pain due to teeth. ? ?

## 2021-11-06 NOTE — ED Provider Notes (Signed)
?[facility] ?Provider Note ? ? ?CSN: [id number] ?Arrival date & time: 11/06/21  [id number] ? ?  ? ?History ? ?Chief Complaint  ?Patient presents with  ? Facial Swelling  ? ? ?Andrew Hinton is a 46 y.o. male. ? ?HPI ? ?Patient out significant medical history presents with complaints of right-sided facial swelling.  He states that he woke up this morning and had facial swelling with pain in his right upper jaw.  He states that this came on suddenly, denies any trauma to the area, states he has not seen dentist in over a years time, states he broke his jaw in the past and has had dental problems ever since.  He states he has some difficulty swallowing but is able to swallow, he is tolerating p.o., denies any trismus or torticollis, no difficulty breathing no chest pain no shortness of breath no fevers or chills.  He is not immunocompromise.  ? ?Home Medications ?Prior to Admission medications   ?Medication Sig Start Date End Date Taking? Authorizing Provider  ?penicillin v potassium (VEETID) 500 MG tablet Take 1 tablet (500 mg total) by mouth 4 (four) times daily for 7 days. 11/06/21 11/13/21 Yes Marcello Fennel, PA-C  ?carvedilol (COREG) 12.5 MG tablet Take 1 tablet (12.5 mg total) by mouth 2 (two) times daily with a meal. 05/30/21   Larey Dresser, MD  ?digoxin (LANOXIN) 0.125 MG tablet TAKE 1 TABLET(0.125 MG) BY MOUTH DAILY 08/21/21   Larey Dresser, MD  ?ENTRESTO 97-103 MG TAKE 1 TABLET BY MOUTH TWICE DAILY 08/15/21   Larey Dresser, MD  ?eplerenone (INSPRA) 50 MG tablet Take 1 tablet (50 mg total) by mouth daily. 05/02/21   Larey Dresser, MD  ?FARXIGA 10 MG TABS tablet TAKE 1 TABLET(10 MG) BY MOUTH DAILY BEFORE BREAKFAST 05/08/21   Larey Dresser, MD  ?meclizine (ANTIVERT) 25 MG tablet TAKE 1/2 TABLET(12.5 MG) BY MOUTH THREE TIMES DAILY AS NEEDED FOR DIZZINESS 05/03/21   Evans Lance, MD  ?meloxicam (MOBIC) 15 MG tablet Take 1 tablet (15 mg total) by mouth daily. 04/04/21   Mordecai Rasmussen, MD  ?methocarbamol (ROBAXIN) 500 MG tablet Take 500 mg by mouth every 8 (eight) hours as needed for muscle spasms.    [provider]  ?sertraline (ZOLOFT) 25 MG tablet Take 25 mg by mouth daily. 07/19/20   [provider]  ?Vitamin D, Ergocalciferol, (DRISDOL) 1.25 MG (50000 UNIT) CAPS capsule Take 50,000 Units by mouth once a week. 03/30/21   [provider]  ?   ? ?Allergies    ?Spironolactone   ? ?Review of Systems   ?Review of Systems  ?Constitutional:  Negative for chills and fever.  ?HENT:  Positive for dental problem and facial swelling. Negative for sore throat.   ?Respiratory:  Negative for shortness of breath.   ?Cardiovascular:  Negative for chest pain.  ?Gastrointestinal:  Negative for abdominal pain.  ?Neurological:  Negative for headaches.  ? ?Physical Exam ?Updated Vital Signs ?BP 132/90   Pulse 74   Temp 98.1 ?F (36.7 ?C) (Oral)   Resp 17   Ht 6' (1.829 m)   Wt 90.5 kg   SpO2 98%   BMI 27.06 kg/m?  ?Physical Exam ?Vitals and nursing note reviewed.  ?Constitutional:   ?   General: He is not in acute distress. ?   Appearance: He is not ill-appearing.  ?HENT:  ?   Head: Normocephalic and atraumatic.  ?   Comments: Patient  right-sided facial swelling mainly in the right maxillary area, there is no overlying skin changes.  Area was palpated slightly tender to palpation no fluctuant induration noted. ?   Nose: No congestion.  ?   Mouth/Throat:  ?   Mouth: Mucous membranes are moist.  ?   Pharynx: Oropharynx is clear.  ?   Comments: No trismus no torticollis, controlling oral secretions, tongue uvula both midline, tonsils equal symmetric bilaterally, no erythema or exudates present, no tongue elevation, submandible was soft on my exam.  Patient has noted poor dental hygiene and had multiple teeth in various stages of decay, right upper jaw and slight erythema mainly around the right canine, the gumline but no palpable fluctuant induration. ?Eyes:  ?   Extraocular  Movements: Extraocular movements intact.  ?   Conjunctiva/sclera: Conjunctivae normal.  ?Cardiovascular:  ?   Rate and Rhythm: Normal rate and regular rhythm.  ?   Pulses: Normal pulses.  ?   Heart sounds: No murmur heard. ?  No friction rub. No gallop.  ?Pulmonary:  ?   Effort: No respiratory distress.  ?   Breath sounds: No wheezing, rhonchi or rales.  ?Musculoskeletal:  ?   Cervical back: No rigidity.  ?Skin: ?   General: Skin is warm and dry.  ?Neurological:  ?   Mental Status: He is alert.  ?Psychiatric:     ?   Mood and Affect: Mood normal.  ? ? ?ED Results / Procedures / Treatments   ?Labs ?(all labs ordered are listed, but only abnormal results are displayed) ?Labs Reviewed - No data to display ? ?EKG ?None ? ?Radiology ?No results found. ? ?Procedures ?Procedures  ? ? ?Medications Ordered in ED ?Medications - No data to display ? ?ED Course/ Medical Decision Making/ A&P ?  ?                        ?Medical Decision Making ? ?This patient presents to the ED for concern of facial swelling, this involves an extensive number of treatment options, and is a complaint that carries with it a high risk of complications and morbidity.  The differential diagnosis includes.  Peritonsillar or retropharyngeal abscess, periorbital cellulitis, Ludwig angina ? ? ? ?Additional history obtained: ? ?Additional history obtained from N/A ?External records from outside source obtained and reviewed including N/A ? ? ?Co morbidities that complicate the patient evaluation ? ?N/A ? ?Social Determinants of Health: ? ?N/A ? ? ? ?Lab Tests: ? ?I Ordered, and personally interpreted labs.  The pertinent results include: N/A ? ? ?Imaging Studies ordered: ? ?I ordered imaging studies including N/A ?I independently visualized and interpreted imaging which showed N/A ?I agree with the radiologist interpretation ? ? ?Cardiac Monitoring: ? ?The patient was maintained on a cardiac monitor.  I personally viewed and interpreted the cardiac  monitored which showed an underlying rhythm of: N/A ? ? ?Medicines ordered and prescription drug management: ? ?I ordered medication including N/A ?I have reviewed the patients home medicines and have made adjustments as needed ? ?Critical Interventions: ? ?N/A ? ? ?Consultations Obtained: ? ?N/A ? ? ? ?Test Considered: ? ?CT maxillofacial but will defer as my suspicion for facial abscess is very low at this time, there is no overlying skin changes, no submandible tenderness or swelling no tongue elevation no trismus or torticollis tonsils equal symmetric bilaterally controlling oral secretions. ? ? ? ?Rule out ?I have low suspicion for peritonsillar abscess, retropharyngeal abscess, or  Ludwig angina as oropharynx was visualized tongue and uvula were both midline, there is no exudates, erythema or edema noted in the posterior pillars or on/ around tonsils.  Low suspicion for an drainable abscess as gumline were palpated no fluctuance or induration felt.  Low suspicion for periorbital or orbital cellulitis as patient face had no erythematous, patient EOMs were intact, he had no pain with eye movement. ? ? ? ? ?Dispostion and problem list ? ?After consideration of the diagnostic results and the patients response to treatment, I feel that the patent would benefit from discharge.  ? ?Facial swelling-likely secondary due to dental cavities, will start him on antibiotics, recommend over-the-counter pain medication, follow-up with dentist for further evaluation given strict return precautions. ? ? ? ? ? ? ? ? ? ? ?Final Clinical Impression(s) / ED Diagnoses ?Final diagnoses:  ?Dental abscess  ? ? ?Rx / DC Orders ?ED Discharge Orders   ? ?      Ordered  ?  penicillin v potassium (VEETID) 500 MG tablet  4 times daily       ? 11/06/21 0926  ? ?  ?  ? ?  ? ? ?  ?Marcello Fennel, PA-C ?11/06/21 [id number] ? ?  ?Fredia Sorrow, MD ?11/07/21 [phone] ? ?

## 2021-11-06 NOTE — Discharge Instructions (Signed)
You have facial swelling likely from a dental cavity, started on antibiotics please take as prescribed.  Recommend over-the-counter pain medication as needed.  If you note that your face has become more swollen, your tongue is elevated having difficulty swallowing or difficulty turning yopr neck you must come back in for reevaluation. ? ?Please follow-up with a dentist for reevaluation. ? ?Come back to the emergency department if you develop chest pain, shortness of breath, severe abdominal pain, uncontrolled nausea, vomiting, diarrhea. ? ?

## 2021-11-09 ENCOUNTER — Other Ambulatory Visit (HOSPITAL_COMMUNITY): Payer: Self-pay | Admitting: Cardiology

## 2021-11-15 ENCOUNTER — Other Ambulatory Visit: Payer: Self-pay

## 2021-11-15 ENCOUNTER — Encounter: Payer: Self-pay | Admitting: Orthopedic Surgery

## 2021-11-15 ENCOUNTER — Ambulatory Visit (INDEPENDENT_AMBULATORY_CARE_PROVIDER_SITE_OTHER): Payer: Medicaid Other | Admitting: Orthopedic Surgery

## 2021-11-15 DIAGNOSIS — M7582 Other shoulder lesions, left shoulder: Secondary | ICD-10-CM

## 2021-11-15 NOTE — Patient Instructions (Signed)

## 2021-11-15 NOTE — Progress Notes (Signed)
New Patient Visit ? ?Assessment: ?Andrew Hinton is a 46 y.o. RHD male with the following: ?Left rotator cuff tendinitis; concern for rotator cuff injury ? ?Plan: ?Mr. Deshotel continues to have excellent response following left shoulder, subacromial steroid injections.  He was interested in proceeding with another injection.  He continues to have pain, and difficulty with overhead motion.  Once again, I reiterated that if this injection is not effective, we should consider an MRI for further evaluation.  He states his understanding.  Follow-up as needed. ? ? ?Procedure note injection Left shoulder  ?  ?Verbal consent was obtained to inject the left shoulder, subacromial space ?Timeout was completed to confirm the site of injection.  The skin was prepped with alcohol and ethyl chloride was sprayed at the injection site.  ?A 21-gauge needle was used to inject 40 mg of Depo-Medrol and 1% lidocaine (3 cc) into the subacromial space of the left shoulder using a posterolateral approach.  ?There were no complications. A sterile bandage was applied. ? ? ? ?Follow-up: ?Return if symptoms worsen or fail to improve. ? ?Subjective: ? ?Chief Complaint  ?Patient presents with  ? Shoulder Pain  ?  Left shld pain, last injected 06/30/21, hurting badly again for 2 weeks or so.  Lifting heavy things for a bit now, caused increased pain.  ? ? ?History of Present Illness: ?Andrew Hinton is a 46 y.o. male who presents for evaluation of left shoulder pain.  His last injection was approximately 5 months ago.  It provided excellent relief until approximately 2 weeks ago.  He would like another injection.  Pain continues in the posterior aspect of the shoulder.  This affects his overhead motion.  No specific injury couple weeks ago.  Prior to this, he had done very well following his injections. ? ? ?Review of Systems: ?No fevers or chills ?No numbness or tingling ?No chest pain ?No shortness of breath ?No bowel or bladder  dysfunction ?No GI distress ?No headaches ? ? ?Objective: ?There were no vitals taken for this visit. ? ?Physical Exam: ? ?General: Alert and oriented. and No acute distress. ?Gait: Normal gait. ? ?Evaluation left shoulder demonstrates no deformity.  No atrophy is appreciated.  He has exquisite tenderness to palpation within the trapezius muscle.  Forward flexion 90 degrees.  Abduction to 75 degrees.  Passively, he has full forward flexion.  Some pain with drop arm testing.  Pain in the empty can testing position.  Negative O'Brien's.  Negative belly press.  Fingers are warm and well-perfused.  2+ radial pulse. ? ?IMAGING: ?I personally reviewed images previously obtained from the ED ? ?No new imaging today. ? ?New Medications:  ?No orders of the defined types were placed in this encounter. ? ? ? ? ? ?Oliver Barre, MD ? ?11/15/2021 ?11:01 PM ? ? ? ?

## 2021-12-13 ENCOUNTER — Telehealth (HOSPITAL_COMMUNITY): Payer: Self-pay

## 2021-12-13 NOTE — Telephone Encounter (Signed)
Called to confirm/remind patient of their appointment at the [facility] on 12/14/21.  ? ?Patient reminded to bring all medications and/or complete list. ? ?Confirmed patient has transportation. Gave directions, instructed to utilize valet parking. ? ?Confirmed appointment prior to ending call.  ? ?

## 2021-12-13 NOTE — Progress Notes (Signed)
?PCP: [facility] ?Primary Cardiologist: Dr. Shirlee Latch  ? ?HPI: ?[name] is a 46 y.o. with history of anxiety, asthma, and depression. Previously a heavy drinker and smoker.  No longer has driver's license due to multiple DWIs.  ?  ?Admitted 10/08/19 after syncopal episode and dyspnea with exertion. Had small SAH and rib fracture. Had echo that showed severely reduced EF, new onset acute systolic heart failure. Cardiac cath was negative for coronary disease, and showed low filling pressures and adequate cardiac output. Discharged with Life Vest given concern that syncope was arrhythmic. Started low dose HF meds.  ? ?Was evaluated at [facility] ED in 3/21 due to dyspnea and rib pain. New rib fracture was noted on the left. SAH had resolved. CXR and CTA negative. He was discharged to home.  ? ?Echo in 6/21 showed that EF remained 30%. Now with [employer] ICD in 8/21.  Echo in 1/22 showed EF 25-30%.   ? ?He returns today for followup of CHF.  Main complaint is a rotator cuff injury on the left.  He is not smoking.  He started drinking again due to shoulder pain but quit again about 2-3 weeks ago. No significant exertional dyspnea or chest pain.  He is working full time as a Curator.  No lightheadedness.  Taking all his meds. Weight down 11 lbs.  ? ?Seen in ED for CP 11/22, cardiac work up negative. He attributed pain to MSK from lifting heavy equipment at work. ? ?Today he returns for HF follow up.Overall feeling fine. Denies SOB/PND/Orthopnea. Appetite ok. No fever or chills. Weight at home stable. Taking all medications. He is not drinking alcohol. Working full time as Curator.  ? 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 Activity  at least 5 hours per day.  ? ?Labs (6/21): K 4.7, creatinine 1.15 ?Labs (7/22): K 4.5, creatinine 1.01 ?Labs (11/22): K 4.0, creatinine 1.06 ? ?PMH: ?1. Anxiety ?2. Asthma ?3. H/o ETOH abuse.  ?4. Chronic systolic CHF: Echo (2/21) with EF < 20%, normal RV.   Nonischemic cardiomyopathy.  ?- RHC/LHC (3/21): No significant CAD; mean RA 3, PA 24/5, mean PCWP 3, CI 2.3.  ?- Cardiac MRI (3/21): EF 25%, normal RV, no LGE.  ?- Echo (6/21): EF 30%, diffuse hypokinesis, normal RV, normal IVC.  ?- [employer] ICD.  ?- Echo (1/22): EF 25-30% ?5. Syncope: Has ICD ?6. Traumatic SAH in 2/21.  ?7. COVID-19 1/22 ? ?SH: Does not drive. No longer smokes. Stopped drinking in 2/21, prior heavy use. Lives with son, mother in law, and father in Social worker. Difficulty paying for medications. Work on cars full time.  ?  ?FH: Mom and 2 sister have heart disease. He has not idea what kind of heart problems. Mother deceased ? Cancer.  ? ?ROS: All systems negative except as listed in HPI, PMH and Problem List. ? ? ?Current Outpatient Medications  ?Medication Sig Dispense Refill  ? carvedilol (COREG) 12.5 MG tablet Take 1 tablet (12.5 mg total) by mouth 2 (two) times daily with a meal. 60 tablet 11  ? digoxin (LANOXIN) 0.125 MG tablet TAKE 1 TABLET(0.125 MG) BY MOUTH DAILY 90 tablet 3  ? ENTRESTO 97-103 MG TAKE 1 TABLET BY MOUTH TWICE DAILY 60 tablet 5  ? eplerenone (INSPRA) 50 MG tablet Take 1 tablet (50 mg total) by mouth daily. 90 tablet 3  ? FARXIGA 10 MG TABS tablet TAKE 1 TABLET(10 MG) BY MOUTH DAILY BEFORE BREAKFAST 30 tablet 11  ? meclizine (ANTIVERT) 25  MG tablet TAKE 1/2 TABLET(12.5 MG) BY MOUTH THREE TIMES DAILY AS NEEDED FOR DIZZINESS 30 tablet 6  ? meloxicam (MOBIC) 15 MG tablet Take 1 tablet (15 mg total) by mouth daily. 30 tablet 0  ? methocarbamol (ROBAXIN) 500 MG tablet Take 500 mg by mouth every 8 (eight) hours as needed for muscle spasms.    ? sertraline (ZOLOFT) 25 MG tablet Take 25 mg by mouth daily.    ? Vitamin D, Ergocalciferol, (DRISDOL) 1.25 MG (50000 UNIT) CAPS capsule Take 50,000 Units by mouth once a week.    ? ?No current facility-administered medications for this encounter.  ? ?Wt Readings from Last 3 Encounters:  ?12/14/21 92.4 kg (203 lb 12.8 oz)  ?11/06/21 90.5 kg  (199 lb 8.3 oz)  ?08/01/21 90.5 kg (199 lb 9.6 oz)  ? ?BP 114/78   Pulse 71   Wt 92.4 kg (203 lb 12.8 oz)   SpO2 97%   BMI 27.64 kg/m?  ?General:  Well appearing. No resp difficulty ?HEENT: normal ?Neck: supple. no JVD. Carotids 2+ bilat; no bruits. No lymphadenopathy or thryomegaly appreciated. ?Cor: PMI nondisplaced. Regular rate & rhythm. No rubs, gallops or murmurs. ?Lungs: clear ?Abdomen: soft, nontender, nondistended. No hepatosplenomegaly. No bruits or masses. Good bowel sounds. ?Extremities: no cyanosis, clubbing, rash, edema ?Neuro: alert & orientedx3, cranial nerves grossly intact. moves all 4 extremities w/o difficulty. Affect pleasant ? ?Assessment/Plan: ?1. Chronic Systolic HF:  Nonischemic cardiomyopathy. Echo in 2/21 showed LV dilation with EF < 20%, the RV appeared normal. Given the LV dilation, I suspected that the cardiomyopathy was not new (probably less likely to be a stress cardiomyopathy related to the subarachnoid hemorrhage). 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  Cannot rule out viral cardiomyopathy.  He has multiple family members with "heart disease" but he does not have details. Coronary angiography did not show any significant CAD.  Cardiac MRI showed no infiltrative disease, no prior MI, EF 25%, RV normal.  Echo in 6/21 showed EF 30% with diffuse hypokinesis, mild LV dilation.  Echo in 1/22 with EF 25-30%.  He had a Environmental manager ICD placed.  Heart Logic Score =12. Discussed.  ?NYHA I.  ?Volume status stable ?- Continue Entresto 97/103 mg bid.  ?- Continue digoxin 0.125 mg daily. Refill digoxin today  ?- Continue eplerenone 50 mg daily. ?- Continue Coreg 12.5 mg bid. ?- Continue Farxiga 10 mg daily.  ?Check BMET  ?2. Syncope: Initial presentation with syncope, no prodrome.  No events.  ?3. SAH: Small volume SAH post-syncope in 2/21.  ?4. ETOH abuse: No longer drinking  ? ? ? ?Follow up with Dr Shirlee Latch in 6 months and an ECHO.  ? ?Tonye Becket NP-C  ?12/14/2021 ?

## 2021-12-14 ENCOUNTER — Encounter (HOSPITAL_COMMUNITY): Payer: Self-pay

## 2021-12-14 ENCOUNTER — Ambulatory Visit (HOSPITAL_COMMUNITY)
Admission: RE | Admit: 2021-12-14 | Discharge: 2021-12-14 | Disposition: A | Discharge status: Home / Self Care | Payer: Medicaid Other | Source: Ambulatory Visit | Attending: Adult Health | Admitting: Adult Health

## 2021-12-14 ENCOUNTER — Ambulatory Visit (INDEPENDENT_AMBULATORY_CARE_PROVIDER_SITE_OTHER): Payer: Medicaid Other

## 2021-12-14 VITALS — BP 114/78 | HR 71 | Wt 203.8 lb

## 2021-12-14 DIAGNOSIS — Z87891 Personal history of nicotine dependence: Secondary | ICD-10-CM | POA: Diagnosis not present

## 2021-12-14 DIAGNOSIS — Z7984 Long term (current) use of oral hypoglycemic drugs: Secondary | ICD-10-CM | POA: Diagnosis not present

## 2021-12-14 DIAGNOSIS — Z8679 Personal history of other diseases of the circulatory system: Secondary | ICD-10-CM | POA: Insufficient documentation

## 2021-12-14 DIAGNOSIS — F1021 Alcohol dependence, in remission: Secondary | ICD-10-CM | POA: Insufficient documentation

## 2021-12-14 DIAGNOSIS — I5022 Chronic systolic (congestive) heart failure: Secondary | ICD-10-CM

## 2021-12-14 DIAGNOSIS — J45909 Unspecified asthma, uncomplicated: Secondary | ICD-10-CM | POA: Insufficient documentation

## 2021-12-14 DIAGNOSIS — I609 Nontraumatic subarachnoid hemorrhage, unspecified: Secondary | ICD-10-CM | POA: Diagnosis not present

## 2021-12-14 DIAGNOSIS — I38 Endocarditis, valve unspecified: Secondary | ICD-10-CM

## 2021-12-14 DIAGNOSIS — F419 Anxiety disorder, unspecified: Secondary | ICD-10-CM | POA: Insufficient documentation

## 2021-12-14 DIAGNOSIS — F1010 Alcohol abuse, uncomplicated: Secondary | ICD-10-CM | POA: Diagnosis not present

## 2021-12-14 DIAGNOSIS — I5042 Chronic combined systolic (congestive) and diastolic (congestive) heart failure: Secondary | ICD-10-CM | POA: Diagnosis not present

## 2021-12-14 DIAGNOSIS — Z79899 Other long term (current) drug therapy: Secondary | ICD-10-CM | POA: Insufficient documentation

## 2021-12-14 DIAGNOSIS — Z95810 Presence of automatic (implantable) cardiac defibrillator: Secondary | ICD-10-CM | POA: Insufficient documentation

## 2021-12-14 LAB — CUP PACEART REMOTE DEVICE CHECK
Battery Remaining Longevity: 180 mo
Battery Remaining Percentage: 100 %
Brady Statistic RV Percent Paced: 0 %
Date Time Interrogation Session: 20230504093000
HighPow Impedance: 84 Ohm
Implantable Lead Implant Date: 20210810
Implantable Lead Location: 753860
Implantable Lead Model: 273
Implantable Lead Serial Number: 111079
Implantable Pulse Generator Implant Date: 20210810
Lead Channel Impedance Value: 463 Ohm
Lead Channel Setting Pacing Amplitude: 2.5 V
Lead Channel Setting Pacing Pulse Width: 0.4 ms
Lead Channel Setting Sensing Sensitivity: 0.5 mV
Pulse Gen Serial Number: 278671

## 2021-12-14 LAB — BASIC METABOLIC PANEL
Anion gap: 6 (ref 5–15)
BUN: 20 mg/dL (ref 6–20)
CO2: 26 mmol/L (ref 22–32)
Calcium: 9.3 mg/dL (ref 8.9–10.3)
Chloride: 107 mmol/L (ref 98–111)
Creatinine, Ser: 1.11 mg/dL (ref 0.61–1.24)
GFR, Estimated: >60 mL/min (ref >60)
Glucose, Bld: 110 mg/dL — ABNORMAL HIGH (ref 70–99)
Potassium: 5.1 mmol/L (ref 3.5–5.1)
Sodium: 139 mmol/L (ref 135–145)

## 2021-12-14 MED ORDER — DIGOXIN 125 MCG PO TABS: 0.1250 mg | ORAL_TABLET | Freq: Every day | ORAL | 3 refills | Status: DC

## 2021-12-14 NOTE — Patient Instructions (Signed)
It was great to see you today! ?No medication changes are needed at this time. ? ? ?Labs today ?We will only contact you if something comes back abnormal or we need to make some changes. ?Otherwise no news is good news! ? ?Your physician recommends that you schedule a follow-up appointment in: 6 months with Dr Shirlee Latch and echo ? ?Your physician has requested that you have an echocardiogram. Echocardiography is a painless test that uses sound waves to create images of your heart. It provides your doctor with information about the size and shape of your heart and how well your heart?s chambers and valves are working. This procedure takes approximately one hour. There are no restrictions for this procedure. ? ? ?Do the following things EVERYDAY: ?Weigh yourself in the morning before breakfast. Write it down and keep it in a log. ?Take your medicines as prescribed ?Eat low salt foods--Limit salt (sodium) to 2000 mg per day.  ?Stay as active as you can everyday ?Limit all fluids for the day to less than 2 liters ? ?At the [facility], you and your health needs are our priority. As part of our continuing mission to provide you with exceptional heart care, we have created designated Provider Care Teams. These Care Teams include your primary Cardiologist (physician) and Advanced Practice Providers (APPs- Physician Assistants and Nurse Practitioners) who all work together to provide you with the care you need, when you need it.  ? ?You may see any of the following providers on your designated Care Team at your next follow up: ?Dr Arvilla Meres ?Dr Marca Ancona ?Tonye Becket, NP ?Robbie Lis, PA ?Jessica Milford,NP ?Anna Genre, PA ?Karle Plumber, PharmD ? ? ?Please be sure to bring in all your medications bottles to every appointment.  ? ? ?

## 2021-12-26 ENCOUNTER — Other Ambulatory Visit: Payer: Self-pay | Admitting: Orthopedic Surgery

## 2021-12-27 NOTE — Progress Notes (Signed)
Remote ICD transmission.   

## 2022-01-16 ENCOUNTER — Other Ambulatory Visit: Payer: Self-pay

## 2022-01-16 ENCOUNTER — Emergency Department (HOSPITAL_COMMUNITY)
Admission: EM | Admit: 2022-01-16 | Discharge: 2022-01-16 | Disposition: A | Discharge status: Home / Self Care | Payer: Medicaid Other | Attending: Emergency Medicine | Admitting: Emergency Medicine

## 2022-01-16 ENCOUNTER — Encounter (HOSPITAL_COMMUNITY): Payer: Self-pay | Admitting: *Deleted

## 2022-01-16 ENCOUNTER — Emergency Department (HOSPITAL_COMMUNITY): Payer: Medicaid Other

## 2022-01-16 DIAGNOSIS — K529 Noninfective gastroenteritis and colitis, unspecified: Secondary | ICD-10-CM | POA: Insufficient documentation

## 2022-01-16 DIAGNOSIS — R42 Dizziness and giddiness: Secondary | ICD-10-CM | POA: Insufficient documentation

## 2022-01-16 DIAGNOSIS — I509 Heart failure, unspecified: Secondary | ICD-10-CM | POA: Insufficient documentation

## 2022-01-16 DIAGNOSIS — R1012 Left upper quadrant pain: Secondary | ICD-10-CM | POA: Diagnosis present

## 2022-01-16 LAB — URINALYSIS, ROUTINE W REFLEX MICROSCOPIC
Bilirubin Urine: NEGATIVE
Glucose, UA: NEGATIVE mg/dL
Hgb urine dipstick: NEGATIVE
Ketones, ur: NEGATIVE mg/dL
Leukocytes,Ua: NEGATIVE
Nitrite: NEGATIVE
Protein, ur: NEGATIVE mg/dL
Specific Gravity, Urine: 1.018 (ref 1.005–1.030)
pH: 5 (ref 5.0–8.0)

## 2022-01-16 LAB — COMPREHENSIVE METABOLIC PANEL
ALT: 35 U/L (ref 0–44)
AST: 29 U/L (ref 15–41)
Albumin: 4.3 g/dL (ref 3.5–5.0)
Alkaline Phosphatase: 46 U/L (ref 38–126)
Anion gap: 5 (ref 5–15)
BUN: 25 mg/dL — ABNORMAL HIGH (ref 6–20)
CO2: 24 mmol/L (ref 22–32)
Calcium: 9.1 mg/dL (ref 8.9–10.3)
Chloride: 107 mmol/L (ref 98–111)
Creatinine, Ser: 0.95 mg/dL (ref 0.61–1.24)
GFR, Estimated: >60 mL/min (ref >60)
Glucose, Bld: 100 mg/dL — ABNORMAL HIGH (ref 70–99)
Potassium: 4.3 mmol/L (ref 3.5–5.1)
Sodium: 136 mmol/L (ref 135–145)
Total Bilirubin: 1.1 mg/dL (ref 0.3–1.2)
Total Protein: 7.7 g/dL (ref 6.5–8.1)

## 2022-01-16 LAB — CBC WITH DIFFERENTIAL/PLATELET
Abs Immature Granulocytes: 0.02 10*3/uL (ref 0.00–0.07)
Basophils Absolute: 0.1 10*3/uL (ref 0.0–0.1)
Basophils Relative: 1 %
Eosinophils Absolute: 0.4 10*3/uL (ref 0.0–0.5)
Eosinophils Relative: 4 %
HCT: 44.3 % (ref 39.0–52.0)
Hemoglobin: 15.1 g/dL (ref 13.0–17.0)
Immature Granulocytes: 0 %
Lymphocytes Relative: 24 %
Lymphs Abs: 1.9 10*3/uL (ref 0.7–4.0)
MCH: 31.9 pg (ref 26.0–34.0)
MCHC: 34.1 g/dL (ref 30.0–36.0)
MCV: 93.7 fL (ref 80.0–100.0)
Monocytes Absolute: 0.5 10*3/uL (ref 0.1–1.0)
Monocytes Relative: 7 %
Neutro Abs: 5 10*3/uL (ref 1.7–7.7)
Neutrophils Relative %: 64 %
Platelets: 376 10*3/uL (ref 150–400)
RBC: 4.73 MIL/uL (ref 4.22–5.81)
RDW: 12.2 % (ref 11.5–15.5)
WBC: 8 10*3/uL (ref 4.0–10.5)
nRBC: 0 % (ref 0.0–0.2)

## 2022-01-16 LAB — LIPASE, BLOOD: Lipase: 31 U/L (ref 11–51)

## 2022-01-16 MED ORDER — ONDANSETRON HCL 4 MG/2ML IJ SOLN
4.0000 mg | Freq: Once | INTRAMUSCULAR | Status: AC
  Administered 2022-01-16: 4 mg via INTRAVENOUS
  Filled 2022-01-16: qty 2

## 2022-01-16 MED ORDER — ONDANSETRON HCL 4 MG PO TABS: 4.0000 mg | ORAL_TABLET | Freq: Three times a day (TID) | ORAL | 0 refills | Status: DC | PRN

## 2022-01-16 MED ORDER — MORPHINE SULFATE (PF) 4 MG/ML IV SOLN
4.0000 mg | Freq: Once | INTRAVENOUS | Status: AC
  Administered 2022-01-16: 4 mg via INTRAVENOUS
  Filled 2022-01-16: qty 1

## 2022-01-16 MED ORDER — HYDROCODONE-ACETAMINOPHEN 5-325 MG PO TABS: 1.0000 | ORAL_TABLET | ORAL | 0 refills | Status: DC | PRN

## 2022-01-16 MED ORDER — AMOXICILLIN-POT CLAVULANATE 875-125 MG PO TABS
1.0000 | ORAL_TABLET | Freq: Once | ORAL | Status: AC
Start: 2022-01-16 — End: 2022-01-16
  Administered 2022-01-16: 1 via ORAL
  Filled 2022-01-16: qty 1

## 2022-01-16 MED ORDER — SODIUM CHLORIDE 0.9 % IV BOLUS
1000.0000 mL | Freq: Once | INTRAVENOUS | Status: AC
  Administered 2022-01-16: 1000 mL via INTRAVENOUS

## 2022-01-16 MED ORDER — IOHEXOL 300 MG/ML  SOLN
100.0000 mL | Freq: Once | INTRAMUSCULAR | Status: AC | PRN
  Administered 2022-01-16: 100 mL via INTRAVENOUS

## 2022-01-16 MED ORDER — AMOXICILLIN-POT CLAVULANATE 875-125 MG PO TABS: 1.0000 | ORAL_TABLET | Freq: Two times a day (BID) | ORAL | 0 refills | Status: DC

## 2022-01-16 NOTE — ED Triage Notes (Signed)
Pt c/o left upper abdominal pain x 4 days and states he has n/v and unable to eat

## 2022-01-16 NOTE — Discharge Instructions (Signed)
Your CT scan confirms that you have inflammation of your small bowel wall which is a condition called enteritis.  This more than likely is a viral infection which should run its course, however it is possible that this is a bacterial infection and therefore you are being prescribed some antibiotics.  Take the entire course of the medication prescribed you have also been prescribed nausea medicine and pain medicine to take as needed to help you with your symptoms.  Do not drive within 4 hours of taking hydrocodone as this medication will make you drowsy.  I recommend a very bland diet for the next several days, make sure you are drinking plenty of fluids, ate good food regimen for the next 24 hours would be clear liquids after which you can bump up to the [employer] which stands for bananas rice applesauce and toast.  If you tolerate these foods and your symptoms continue to improve you can advance to a regular diet as tolerated.  Plan to see your doctor if symptoms persist, returning here for any new or worsening symptoms.

## 2022-01-16 NOTE — ED Notes (Signed)
Pt teaching provided on medications that may cause drowsiness. Pt instructed not to drive or operate heavy machinery while taking the prescribed medication. Pt verbalized understanding.   Pt provided discharge instructions and prescription information. Pt was given the opportunity to ask questions and questions were answered.   

## 2022-01-16 NOTE — ED Provider Notes (Signed)
[facility] EMERGENCY DEPARTMENT Provider Note   CSN: [id number] Arrival date & time: 01/16/22  1237     History  Chief Complaint  Patient presents with   Abdominal Pain    Andrew Hinton is a 46 y.o. male with a history including subarachnoid hemorrhage, chronic CHF with ICD implant, history of hepatic steatosis, distant history of EtOH abuse presenting for evaluation of day 4 of left upper quadrant pain in association with nausea vomiting and diarrhea.  He also endorses low-grade fever to 100.  He describes eating takeout food 4 nights ago, his son ate the same food but only the patient woke up early Saturday morning with left upper quadrant abdominal pain along with frequent episodes of vomiting and diarrhea.  He denies any hematemesis or bloody stools.  He has had no treatment for his symptoms prior to arrival but did take Tylenol yesterday for mild headache which she does not have today.  He denies chest pain and shortness of breath.  He also denies cough, URI type symptoms, sore throat.  He does feel lightheaded, reports having a distant history of vertigo and has similar symptoms today, denies headache, neck pain, tinnitus.  He is been unable to keep any p.o. intake down since yesterday.  He did not drink alcohol this weekend.  The history is provided by the patient.       Home Medications Prior to Admission medications   Medication Sig Start Date End Date Taking? Authorizing Provider  amoxicillin-clavulanate (AUGMENTIN) 875-125 MG tablet Take 1 tablet by mouth every 12 (twelve) hours. 01/16/22  Yes Abygayle Deltoro, Raynelle Fanning, PA-C  HYDROcodone-acetaminophen (NORCO/VICODIN) 5-325 MG tablet Take 1 tablet by mouth every 4 (four) hours as needed. 01/16/22  Yes Jaedah Lords, Raynelle Fanning, PA-C  ondansetron (ZOFRAN) 4 MG tablet Take 1 tablet (4 mg total) by mouth every 8 (eight) hours as needed for nausea or vomiting. 01/16/22  Yes Phil Michels, Raynelle Fanning, PA-C  carvedilol (COREG) 12.5 MG tablet Take 1 tablet (12.5 mg total) by  mouth 2 (two) times daily with a meal. 05/30/21   Laurey Morale, MD  digoxin (LANOXIN) 0.125 MG tablet Take 1 tablet (0.125 mg total) by mouth daily. 12/14/21   Clegg, Amy D, NP  ENTRESTO 97-103 MG TAKE 1 TABLET BY MOUTH TWICE DAILY 11/09/21   Laurey Morale, MD  eplerenone (INSPRA) 50 MG tablet Take 1 tablet (50 mg total) by mouth daily. 05/02/21   Laurey Morale, MD  FARXIGA 10 MG TABS tablet TAKE 1 TABLET(10 MG) BY MOUTH DAILY BEFORE BREAKFAST 05/08/21   Laurey Morale, MD  meclizine (ANTIVERT) 25 MG tablet TAKE 1/2 TABLET(12.5 MG) BY MOUTH THREE TIMES DAILY AS NEEDED FOR DIZZINESS 05/03/21   Marinus Maw, MD  meloxicam (MOBIC) 15 MG tablet TAKE 1 TABLET(15 MG) BY MOUTH DAILY 12/26/21   Oliver Barre, MD  methocarbamol (ROBAXIN) 500 MG tablet Take 500 mg by mouth every 8 (eight) hours as needed for muscle spasms.    [provider]  sertraline (ZOLOFT) 25 MG tablet Take 25 mg by mouth daily. 07/19/20   [provider]  Vitamin D, Ergocalciferol, (DRISDOL) 1.25 MG (50000 UNIT) CAPS capsule Take 50,000 Units by mouth once a week. 03/30/21   [provider]      Allergies    Spironolactone    Review of Systems   Review of Systems  Constitutional:  Positive for fever.  HENT:  Negative for congestion and sore throat.   Eyes: Negative.   Respiratory:  Negative for chest tightness and shortness of breath.   Cardiovascular:  Negative for chest pain.  Gastrointestinal:  Positive for abdominal pain, diarrhea, nausea and vomiting.  Genitourinary: Negative.   Musculoskeletal:  Negative for arthralgias, joint swelling and neck pain.  Skin: Negative.  Negative for rash and wound.  Neurological:  Positive for light-headedness. Negative for dizziness, weakness, numbness and headaches.  Psychiatric/Behavioral: Negative.    All other systems reviewed and are negative.   Physical Exam Updated Vital Signs BP 137/86   Pulse 62   Temp 98.5 F (36.9 C) (Oral)   Resp 14    Ht 6' (1.829 m)   Wt 90.7 kg   SpO2 98%   BMI 27.12 kg/m  Physical Exam Vitals and nursing note reviewed.  Constitutional:      Appearance: He is well-developed.  HENT:     Head: Normocephalic and atraumatic.  Eyes:     Extraocular Movements: Extraocular movements intact.     Conjunctiva/sclera: Conjunctivae normal.     Pupils: Pupils are equal, round, and reactive to light.  Cardiovascular:     Rate and Rhythm: Normal rate and regular rhythm.     Heart sounds: Normal heart sounds.  Pulmonary:     Effort: Pulmonary effort is normal.     Breath sounds: Normal breath sounds. No wheezing.  Abdominal:     General: Abdomen is protuberant. Bowel sounds are normal.     Palpations: Abdomen is soft.     Tenderness: There is abdominal tenderness in the left upper quadrant. There is guarding. There is no rebound.  Musculoskeletal:        General: Normal range of motion.     Cervical back: Normal range of motion.  Skin:    General: Skin is warm and dry.  Neurological:     General: No focal deficit present.     Mental Status: He is alert.     ED Results / Procedures / Treatments   Labs (all labs ordered are listed, but only abnormal results are displayed) Labs Reviewed  COMPREHENSIVE METABOLIC PANEL - Abnormal; Notable for the following components:      Result Value   Glucose, Bld 100 (*)    BUN 25 (*)    All other components within normal limits  LIPASE, BLOOD  URINALYSIS, ROUTINE W REFLEX MICROSCOPIC  CBC WITH DIFFERENTIAL/PLATELET    EKG EKG Interpretation  Date/Time:  Tuesday January 16 2022 13:30:52 EDT Ventricular Rate:  79 PR Interval:  116 QRS Duration: 93 QT Interval:  367 QTC Calculation: 421 R Axis:   14 Text Interpretation: Sinus rhythm Borderline short PR interval Borderline repolarization abnormality Confirmed by Nicanor Alcon, April ([id number]) on 01/17/2022 9:27:18 AM  Radiology Results for orders placed or performed during the hospital encounter of 01/16/22   Lipase, blood  Result Value Ref Range   Lipase 31 11 - 51 U/L  Comprehensive metabolic panel  Result Value Ref Range   Sodium 136 135 - 145 mmol/L   Potassium 4.3 3.5 - 5.1 mmol/L   Chloride 107 98 - 111 mmol/L   CO2 24 22 - 32 mmol/L   Glucose, Bld 100 (H) 70 - 99 mg/dL   BUN 25 (H) 6 - 20 mg/dL   Creatinine, Ser 7.37 0.61 - 1.24 mg/dL   Calcium 9.1 8.9 - 10.6 mg/dL   Total Protein 7.7 6.5 - 8.1 g/dL   Albumin 4.3 3.5 - 5.0 g/dL   AST 29 15 - 41 U/L   ALT 35 0 -  44 U/L   Alkaline Phosphatase 46 38 - 126 U/L   Total Bilirubin 1.1 0.3 - 1.2 mg/dL   GFR, Estimated >57 >84 mL/min   Anion gap 5 5 - 15  Urinalysis, Routine w reflex microscopic  Result Value Ref Range   Color, Urine YELLOW YELLOW   APPearance CLEAR CLEAR   Specific Gravity, Urine 1.018 1.005 - 1.030   pH 5.0 5.0 - 8.0   Glucose, UA NEGATIVE NEGATIVE mg/dL   Hgb urine dipstick NEGATIVE NEGATIVE   Bilirubin Urine NEGATIVE NEGATIVE   Ketones, ur NEGATIVE NEGATIVE mg/dL   Protein, ur NEGATIVE NEGATIVE mg/dL   Nitrite NEGATIVE NEGATIVE   Leukocytes,Ua NEGATIVE NEGATIVE  CBC with Differential  Result Value Ref Range   WBC 8.0 4.0 - 10.5 K/uL   RBC 4.73 4.22 - 5.81 MIL/uL   Hemoglobin 15.1 13.0 - 17.0 g/dL   HCT 69.6 29.5 - 28.4 %   MCV 93.7 80.0 - 100.0 fL   MCH 31.9 26.0 - 34.0 pg   MCHC 34.1 30.0 - 36.0 g/dL   RDW 13.2 44.0 - 10.2 %   Platelets 376 150 - 400 K/uL   nRBC 0.0 0.0 - 0.2 %   Neutrophils Relative % 64 %   Neutro Abs 5.0 1.7 - 7.7 K/uL   Lymphocytes Relative 24 %   Lymphs Abs 1.9 0.7 - 4.0 K/uL   Monocytes Relative 7 %   Monocytes Absolute 0.5 0.1 - 1.0 K/uL   Eosinophils Relative 4 %   Eosinophils Absolute 0.4 0.0 - 0.5 K/uL   Basophils Relative 1 %   Basophils Absolute 0.1 0.0 - 0.1 K/uL   Immature Granulocytes 0 %   Abs Immature Granulocytes 0.02 0.00 - 0.07 K/uL   CT ABDOMEN PELVIS W CONTRAST  Result Date: 01/16/2022 CLINICAL DATA:  Left upper quadrant pain EXAM: CT ABDOMEN AND  PELVIS WITH CONTRAST TECHNIQUE: Multidetector CT imaging of the abdomen and pelvis was performed using the standard protocol following bolus administration of intravenous contrast. RADIATION DOSE REDUCTION: This exam was performed according to the departmental dose-optimization program which includes automated exposure control, adjustment of the mA and/or kV according to patient size and/or use of iterative reconstruction technique. CONTRAST:  OMNIPAQUE IOHEXOL 300 MG/ML  SOLN COMPARISON:  CT abdomen and pelvis 06/22/2021 FINDINGS: Lower chest: No acute abnormality. Hepatobiliary: Liver is upper normal size with normal contour. No suspicious hepatic mass identified. Gallbladder appears normal. No biliary ductal dilatation identified. Pancreas: Unremarkable. No pancreatic ductal dilatation or surrounding inflammatory changes. Spleen: Normal in size without focal abnormality. Adrenals/Urinary Tract: Adrenal glands appear normal. Mild renal cortical thinning on the right. No hydronephrosis or enhancing renal mass visualized bilaterally. Urinary bladder appears normal. Stomach/Bowel: No bowel obstruction, free air or pneumatosis. Upper normal caliber of the third portion the duodenum measuring 2.6 cm in diameter with air-fluid level noted. Colonic diverticulosis. Appendix not visualized. Vascular/Lymphatic: No significant vascular findings are present. No enlarged abdominal or pelvic lymph nodes. Reproductive: Prostate is unremarkable. Other: No ascites.  Tiny umbilical hernia containing fat. Musculoskeletal: Degenerative changes in the lower lumbar spine. No suspicious bony lesions identified. IMPRESSION: 1. Possible mild enteritis/duodenitis. Otherwise no acute process identified. 2. Colonic diverticulosis. Electronically Signed   By: Jannifer Hick M.D.   On: 01/16/2022 14:40      Procedures Procedures    Medications Ordered in ED Medications  sodium chloride 0.9 % bolus 1,000 mL ( Intravenous  Stopped 01/16/22 1416)  morphine (PF) 4 MG/ML injection 4 mg (  4 mg Intravenous Given 01/16/22 1331)  ondansetron (ZOFRAN) injection 4 mg (4 mg Intravenous Given 01/16/22 1331)  iohexol (OMNIPAQUE) 300 MG/ML solution 100 mL (100 mLs Intravenous Contrast Given 01/16/22 1429)  amoxicillin-clavulanate (AUGMENTIN) 875-125 MG per tablet 1 tablet (1 tablet Oral Given 01/16/22 1531)    ED Course/ Medical Decision Making/ A&P                           Medical Decision Making 4 day h/o LUQ abd pain with n/v, diarrhea, fever concerning for intraabdominal infection, ddx including gastroenteritis, acute pancreatitis/abscess, perforation, diverticulitis.    Amount and/or Complexity of Data Reviewed Labs: ordered.    Details: normal wbc count, normal LFT's and lipase. UA negative. Radiology: ordered.    Details: Ct imaging suggesting enteritis/duodenitis, no sbo, no diverticulitis, abd perforation, pancreas and liver normal, no kidney stones/hydro, spleen normal. ECG/medicine tests: ordered and independent interpretation performed.    Details: nsr  Risk OTC drugs. Prescription drug management. Decision regarding hospitalization. Risk Details: Home instructions discussed, f/u care and strict return precautions also outlined.           Final Clinical Impression(s) / ED Diagnoses Final diagnoses:  Enteritis    Rx / DC Orders ED Discharge Orders          Ordered    ondansetron (ZOFRAN) 4 MG tablet  Every 8 hours PRN        01/16/22 1526    amoxicillin-clavulanate (AUGMENTIN) 875-125 MG tablet  Every 12 hours        01/16/22 1526    HYDROcodone-acetaminophen (NORCO/VICODIN) 5-325 MG tablet  Every 4 hours PRN        01/16/22 1526              Burgess Amor, PA-C 01/18/22 1451    Gerhard Munch, MD 01/18/22 1753

## 2022-01-16 NOTE — ED Notes (Signed)
Pt put in gown and cardiac monitor  °

## 2022-02-08 ENCOUNTER — Encounter (HOSPITAL_COMMUNITY): Payer: Self-pay | Admitting: *Deleted

## 2022-02-08 ENCOUNTER — Other Ambulatory Visit: Payer: Self-pay

## 2022-02-08 ENCOUNTER — Emergency Department (HOSPITAL_COMMUNITY): Payer: Medicaid Other

## 2022-02-08 ENCOUNTER — Emergency Department (HOSPITAL_COMMUNITY)
Admission: EM | Admit: 2022-02-08 | Discharge: 2022-02-08 | Disposition: A | Discharge status: Home / Self Care | Payer: Medicaid Other | Attending: Student | Admitting: Student

## 2022-02-08 DIAGNOSIS — Z955 Presence of coronary angioplasty implant and graft: Secondary | ICD-10-CM | POA: Diagnosis not present

## 2022-02-08 DIAGNOSIS — Z5189 Encounter for other specified aftercare: Secondary | ICD-10-CM

## 2022-02-08 DIAGNOSIS — J45909 Unspecified asthma, uncomplicated: Secondary | ICD-10-CM | POA: Diagnosis not present

## 2022-02-08 DIAGNOSIS — Z8616 Personal history of COVID-19: Secondary | ICD-10-CM | POA: Diagnosis not present

## 2022-02-08 DIAGNOSIS — I5042 Chronic combined systolic (congestive) and diastolic (congestive) heart failure: Secondary | ICD-10-CM | POA: Insufficient documentation

## 2022-02-08 DIAGNOSIS — Z87891 Personal history of nicotine dependence: Secondary | ICD-10-CM | POA: Insufficient documentation

## 2022-02-08 DIAGNOSIS — Z23 Encounter for immunization: Secondary | ICD-10-CM | POA: Insufficient documentation

## 2022-02-08 DIAGNOSIS — Z4800 Encounter for change or removal of nonsurgical wound dressing: Secondary | ICD-10-CM | POA: Insufficient documentation

## 2022-02-08 MED ORDER — BACITRACIN ZINC 500 UNIT/GM EX OINT
TOPICAL_OINTMENT | Freq: Once | CUTANEOUS | Status: AC
  Administered 2022-02-08: 2 via TOPICAL
  Filled 2022-02-08: qty 1.8

## 2022-02-08 MED ORDER — TETANUS-DIPHTH-ACELL PERTUSSIS 5-2.5-18.5 LF-MCG/0.5 IM SUSY
0.5000 mL | PREFILLED_SYRINGE | Freq: Once | INTRAMUSCULAR | Status: AC
  Administered 2022-02-08: 0.5 mL via INTRAMUSCULAR
  Filled 2022-02-08: qty 0.5

## 2022-02-08 NOTE — ED Notes (Signed)
ED Provider at bedside. 

## 2022-02-08 NOTE — ED Triage Notes (Signed)
Pt states he was working on a car on Monday, scraped his left foot, states wound is getting worse, yellow drainage from it. Denies hx of DM

## 2022-02-09 NOTE — ED Provider Notes (Addendum)
[name] EMERGENCY DEPARTMENT Provider Note  CSN: [id number] Arrival date & time: 02/08/22 1814  Chief Complaint(s) Wound Check  HPI Andrew Hinton is a 46 y.o. male who presents emergency department for evaluation of wound check.  Patient states that he was working on a car when he struck his left foot against an object and suffered a laceration to the dorsum of his left foot.  He states that this happened 5 days ago and he is starting to notice an abnormal drainage coming from the wound.  He states that he has been using accommodation of topical antibiotics and hydrogen peroxide but has not been covering the wound.  He denies fever, worsening pain in the foot,'s joint swelling, numbness, tingling, weakness or other systemic symptoms.   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 Authorizing Provider  amoxicillin-clavulanate (AUGMENTIN) 875-125 MG tablet Take 1 tablet by mouth every 12 (twelve) hours. 01/16/22   Burgess Amor, PA-C  carvedilol (COREG) 12.5 MG tablet Take 1 tablet (12.5 mg total) by mouth 2 (two) times daily with a meal. 05/30/21   Laurey Morale, MD  digoxin (LANOXIN) 0.125 MG tablet Take 1 tablet (0.125 mg total) by mouth daily. 12/14/21   Clegg, Amy D, NP  ENTRESTO 97-103 MG TAKE 1 TABLET BY MOUTH TWICE DAILY 11/09/21   Laurey Morale, MD  eplerenone (INSPRA) 50 MG tablet Take 1 tablet (50 mg total) by mouth daily. 05/02/21   Laurey Morale, MD  FARXIGA 10 MG TABS tablet TAKE 1 TABLET(10 MG) BY MOUTH DAILY BEFORE BREAKFAST 05/08/21   Laurey Morale, MD  HYDROcodone-acetaminophen (NORCO/VICODIN) 5-325 MG tablet Take 1 tablet by mouth every 4 (four) hours as needed. 01/16/22   Burgess Amor, PA-C  meclizine (ANTIVERT) 25 MG tablet TAKE 1/2 TABLET(12.5 MG) BY MOUTH THREE TIMES DAILY AS NEEDED FOR DIZZINESS 05/03/21   Marinus Maw, MD  meloxicam (MOBIC) 15 MG tablet TAKE 1 TABLET(15 MG) BY MOUTH DAILY 12/26/21   Oliver Barre, MD  methocarbamol (ROBAXIN) 500 MG tablet Take 500 mg by mouth every 8 (eight) hours as needed for muscle spasms.    [provider]  ondansetron (ZOFRAN) 4 MG tablet Take 1 tablet (4 mg total) by mouth every 8 (eight) hours as needed for nausea or vomiting. 01/16/22   Burgess Amor, PA-C  sertraline (ZOLOFT) 25 MG tablet Take 25 mg by mouth daily. 07/19/20   [provider]  Vitamin D, Ergocalciferol, (DRISDOL) 1.25 MG (50000 UNIT) CAPS capsule Take 50,000 Units by mouth  once a week. 03/30/21   [provider]                                                                                                                                    Past Surgical History Past Surgical History:  Procedure Laterality Date   APPENDECTOMY     ICD IMPLANT N/A 03/22/2020   Procedure: ICD IMPLANT;  Surgeon: Marinus Maw, MD;  Location: [facility] INVASIVE CV LAB;  Service: Cardiovascular;  Laterality: N/A;   RIGHT/LEFT HEART CATH AND CORONARY ANGIOGRAPHY N/A 10/12/2019    Procedure: RIGHT/LEFT HEART CATH AND CORONARY ANGIOGRAPHY;  Surgeon: Laurey Morale, MD;  Location: [facility] INVASIVE CV LAB;  Service: Cardiovascular;  Laterality: N/A;   Family History History reviewed. No pertinent family history.  Social History Social History   Tobacco Use   Smoking status: Former    Packs/day: 0.50    Types: Cigarettes    Quit date: 10/07/2017    Years since quitting: 4.3   Smokeless tobacco: Never  Vaping Use   Vaping Use: Never used  Substance Use Topics   Alcohol use: Yes    Comment: occasionally    Drug use: No   Allergies Spironolactone  Review of Systems Review of Systems  Skin:  Positive for wound.    Physical Exam Vital Signs  I have reviewed the triage vital signs BP (!) 153/97 (BP Location: Left Arm)   Pulse 72   Temp 97.9 F (36.6 C) (Oral)   Resp 17   Ht 6' (1.829 m)   Wt 92.5 kg   SpO2 100%   BMI 27.67 kg/m   Physical Exam Constitutional:      General: He is not in acute distress.    Appearance: Normal appearance.  HENT:     Head: Normocephalic and atraumatic.     Nose: No congestion or rhinorrhea.  Eyes:     General:        Right eye: No discharge.        Left eye: No discharge.     Extraocular Movements: Extraocular movements intact.     Pupils: Pupils are equal, round, and reactive to light.  Cardiovascular:     Rate and Rhythm: Normal rate and regular rhythm.     Heart sounds: No murmur heard. Pulmonary:     Effort: No respiratory distress.     Breath sounds: No wheezing or rales.  Abdominal:     General: There is no distension.     Tenderness: There is no abdominal tenderness.  Musculoskeletal:        General: Normal range of motion.     Cervical back: Normal range of motion.  Skin:    General: Skin is warm and dry.     Findings: Lesion present.  Neurological:     General: No focal deficit present.     Mental Status: He is alert.  ED Results and Treatments Labs (all labs ordered are listed, but only  abnormal results are displayed) Labs Reviewed - No data to display                                                                                                                        Radiology DG Foot Complete Left  Result Date: 02/08/2022 CLINICAL DATA:  Foot injury.  Draining wound. EXAM: LEFT FOOT - COMPLETE 3+ VIEW COMPARISON:  None Available. FINDINGS: No acute bony abnormality. Specifically, no fracture, subluxation, or dislocation. No radiopaque foreign body. No soft tissue gas. IMPRESSION: Negative. Electronically Signed   By: Charlett Nose M.D.   On: 02/08/2022 21:35    Pertinent labs & imaging results that were available during my care of the patient were reviewed by me and considered in my medical decision making (see MDM for details).  Medications Ordered in ED Medications  Tdap (BOOSTRIX) injection 0.5 mL (0.5 mLs Intramuscular Given 02/08/22 2151)  bacitracin ointment (2 Applications Topical Given 02/08/22 2156)                                                                                                                                     Procedures Procedures  (including critical care time)  Medical Decision Making / ED Course   This patient presents to the ED for concern of wound check, this involves an extensive number of treatment options, and is a complaint that carries with it a high risk of complications and morbidity.  The differential diagnosis includes appropriately healing granulation tissue, cellulitis, open fracture, retained foreign body  MDM: Patient seen in the emergency department for evaluation of wound check.  On physical exam, patient has a 1.5 cm circular healing wound to the dorsum of the left foot with a nearby abrasion just distally.  Inside this wound, there does appear to be granulation tissue which the patient states is the primary source of his concern.  There is minimal surrounding erythema and no extensive streaking.  There is no worsening  tenderness around the wound and the patient is able to range the ankle without difficulty.  An x-ray of the foot was obtained that shows no retained foreign body or fracture.  Wound care was applied with topical antibiotics Xeroform and a bandage and patient was educated on appropriate wound care techniques and informed that he needs to stop using hydrogen peroxide  as this can delay wound healing.  Tdap updated.  Patient then discharged with PCP follow-up.   Additional history obtained:  -External records from outside source obtained and reviewed including: Chart review including previous notes, labs, imaging, consultation notes    Imaging Studies ordered: I ordered imaging studies including x-ray foot I independently visualized and interpreted imaging. I agree with the radiologist interpretation   Medicines ordered and prescription drug management: Meds ordered this encounter  Medications   Tdap (BOOSTRIX) injection 0.5 mL   bacitracin ointment    -I have reviewed the patients home medicines and have made adjustments as needed  Critical interventions none    Cardiac Monitoring: The patient was maintained on a cardiac monitor.  I personally viewed and interpreted the cardiac monitored which showed an underlying rhythm of: NSR  Social Determinants of Health:  Factors impacting patients care include: none   Reevaluation: After the interventions noted above, I reevaluated the patient and found that they have : Stayed the same  Co morbidities that complicate the patient evaluation  Past Medical History:  Diagnosis Date   Anxiety    Asthma    Depression    Nerve damage       Dispostion: I considered admission for this patient, but patient does not meet inpatient criteria for hospital admission and his wound is healing appropriately.  He is safe for discharge with outpatient follow-up     Final Clinical Impression(s) / ED Diagnoses Final diagnoses:  Visit for wound  check     @PCDICTATION @    Kerianna Rawlinson, , MD 02/09/22 1308    02/11/22, MD 02/09/22 1309

## 2022-04-06 ENCOUNTER — Ambulatory Visit: Payer: Medicaid Other | Admitting: Orthopedic Surgery

## 2022-06-10 ENCOUNTER — Other Ambulatory Visit (HOSPITAL_COMMUNITY): Payer: Self-pay | Admitting: Cardiology

## 2022-07-31 ENCOUNTER — Emergency Department (HOSPITAL_COMMUNITY)
Admission: EM | Admit: 2022-07-31 | Discharge: 2022-07-31 | Disposition: A | Discharge status: Home / Self Care | Payer: Medicaid Other | Attending: Emergency Medicine | Admitting: Emergency Medicine

## 2022-07-31 ENCOUNTER — Other Ambulatory Visit: Payer: Self-pay

## 2022-07-31 ENCOUNTER — Emergency Department (HOSPITAL_COMMUNITY): Payer: Medicaid Other

## 2022-07-31 ENCOUNTER — Encounter (HOSPITAL_COMMUNITY): Payer: Self-pay

## 2022-07-31 DIAGNOSIS — Z8616 Personal history of COVID-19: Secondary | ICD-10-CM | POA: Insufficient documentation

## 2022-07-31 DIAGNOSIS — K529 Noninfective gastroenteritis and colitis, unspecified: Secondary | ICD-10-CM | POA: Insufficient documentation

## 2022-07-31 DIAGNOSIS — Z1152 Encounter for screening for COVID-19: Secondary | ICD-10-CM | POA: Insufficient documentation

## 2022-07-31 DIAGNOSIS — J45909 Unspecified asthma, uncomplicated: Secondary | ICD-10-CM | POA: Insufficient documentation

## 2022-07-31 DIAGNOSIS — Z87891 Personal history of nicotine dependence: Secondary | ICD-10-CM | POA: Insufficient documentation

## 2022-07-31 DIAGNOSIS — R0602 Shortness of breath: Secondary | ICD-10-CM | POA: Diagnosis present

## 2022-07-31 DIAGNOSIS — B349 Viral infection, unspecified: Secondary | ICD-10-CM | POA: Diagnosis not present

## 2022-07-31 LAB — RESP PANEL BY RT-PCR (RSV, FLU A&B, COVID)  RVPGX2
Influenza A by PCR: NEGATIVE
Influenza B by PCR: NEGATIVE
Resp Syncytial Virus by PCR: NEGATIVE
SARS Coronavirus 2 by RT PCR: NEGATIVE

## 2022-07-31 LAB — URINALYSIS, ROUTINE W REFLEX MICROSCOPIC
Bilirubin Urine: NEGATIVE
Glucose, UA: NEGATIVE mg/dL
Hgb urine dipstick: NEGATIVE
Ketones, ur: NEGATIVE mg/dL
Leukocytes,Ua: NEGATIVE
Nitrite: NEGATIVE
Protein, ur: NEGATIVE mg/dL
Specific Gravity, Urine: 1.012 (ref 1.005–1.030)
pH: 5 (ref 5.0–8.0)

## 2022-07-31 LAB — BASIC METABOLIC PANEL
Anion gap: 7 (ref 5–15)
BUN: 15 mg/dL (ref 6–20)
CO2: 25 mmol/L (ref 22–32)
Calcium: 9.4 mg/dL (ref 8.9–10.3)
Chloride: 104 mmol/L (ref 98–111)
Creatinine, Ser: 1.08 mg/dL (ref 0.61–1.24)
GFR, Estimated: >60 mL/min (ref >60)
Glucose, Bld: 110 mg/dL — ABNORMAL HIGH (ref 70–99)
Potassium: 4.4 mmol/L (ref 3.5–5.1)
Sodium: 136 mmol/L (ref 135–145)

## 2022-07-31 LAB — CBC
HCT: 45.9 % (ref 39.0–52.0)
Hemoglobin: 15.4 g/dL (ref 13.0–17.0)
MCH: 31.6 pg (ref 26.0–34.0)
MCHC: 33.6 g/dL (ref 30.0–36.0)
MCV: 94.3 fL (ref 80.0–100.0)
Platelets: 352 10*3/uL (ref 150–400)
RBC: 4.87 MIL/uL (ref 4.22–5.81)
RDW: 11.7 % (ref 11.5–15.5)
WBC: 6.7 10*3/uL (ref 4.0–10.5)
nRBC: 0 % (ref 0.0–0.2)

## 2022-07-31 LAB — TROPONIN I (HIGH SENSITIVITY)
Troponin I (High Sensitivity): 10 ng/L (ref <18)
Troponin I (High Sensitivity): 8 ng/L (ref <18)

## 2022-07-31 MED ORDER — HYDROCODONE-ACETAMINOPHEN 5-325 MG PO TABS
1.0000 | ORAL_TABLET | Freq: Once | ORAL | Status: AC
  Administered 2022-07-31: 1 via ORAL
  Filled 2022-07-31: qty 1

## 2022-07-31 MED ORDER — METHOCARBAMOL 500 MG PO TABS: 1000.0000 mg | ORAL_TABLET | Freq: Two times a day (BID) | ORAL | 0 refills | Status: AC

## 2022-07-31 MED ORDER — ACETAMINOPHEN 325 MG PO TABS: 650.0000 mg | ORAL_TABLET | Freq: Four times a day (QID) | ORAL | 0 refills | Status: AC | PRN

## 2022-07-31 NOTE — ED Provider Triage Note (Signed)
Emergency Medicine Provider Triage Evaluation Note  Andrew Hinton , a 46 y.o. male  was evaluated in triage.  Pt complains of fever onset yesterday. Denies sick contacts at home, however, notes sick contacts at work. Has associated shortness of breath, generalized body aches, rhinorrhea. Tried OTC meds. Notes sternal chest pain not associated with cough. Denies nasal congestion.  Review of Systems  Positive:  Negative:   Physical Exam  BP (!) 154/116 (BP Location: Right Arm)   Pulse 86   Temp 98.4 F (36.9 C) (Oral)   Resp 18   Ht 6' (1.829 m)   Wt 91.6 kg   SpO2 96%   BMI 27.40 kg/m  Gen:   Awake, no distress   Resp:  Normal effort  MSK:   Moves extremities without difficulty  Other:    Medical Decision Making  Medically screening exam initiated at 9:42 AM.  Appropriate orders placed.  Trai Ells was informed that the remainder of the evaluation will be completed by another provider, this initial triage assessment does not replace that evaluation, and the importance of remaining in the ED until their evaluation is complete.     Perel Hauschild A, PA-C 07/31/22 [phone]

## 2022-07-31 NOTE — Discharge Instructions (Addendum)
It was a pleasure caring for you today in the emergency department. ° °Please return to the emergency department for any worsening or worrisome symptoms. ° ° °

## 2022-07-31 NOTE — ED Notes (Signed)
Pt able to tolerate liquids, denies N/V

## 2022-07-31 NOTE — ED Triage Notes (Signed)
Pt presents to ED with fever since Sunday up to 100.1, shortness of breath, generalized body aches

## 2022-07-31 NOTE — ED Provider Notes (Addendum)
[facility] EMERGENCY DEPARTMENT Provider Note   CSN: [id number] Arrival date & time: 07/31/22  0755     History  Chief Complaint  Patient presents with   Shortness of Breath    Andrew Hinton is a 46 y.o. male.  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 No urination change Chest wall pain, no dib, no palpitations or syncope, no diaphoresis  No brbpr, no melena, no vomiting but having some nausea Low grade fever at home, no blood in stool Intermittent abd cramping  No recent suspicions po intake or recent travel Abd surg w/ appendectomy       Past Medical History:  Diagnosis Date   Anxiety    Asthma    Depression    Nerve damage     Past Surgical History:  Procedure Laterality Date   APPENDECTOMY     ICD IMPLANT N/A 03/22/2020   Procedure: ICD IMPLANT;  Surgeon: Marinus Maw, MD;  Location: [facility] INVASIVE CV LAB;  Service: Cardiovascular;  Laterality: N/A;   RIGHT/LEFT HEART CATH AND CORONARY ANGIOGRAPHY N/A 10/12/2019   Procedure: RIGHT/LEFT HEART CATH AND CORONARY ANGIOGRAPHY;  Surgeon: Laurey Morale, MD;  Location: [facility] INVASIVE CV LAB;  Service: Cardiovascular;  Laterality: N/A;     The history is provided by the patient. No language interpreter was used.  Shortness of Breath Associated symptoms: abdominal pain   Associated symptoms: no chest pain, no cough, no fever, no headaches, no rash and no vomiting        Home Medications Prior to Admission medications   Medication Sig Start Date End Date Taking? Authorizing Provider  acetaminophen (TYLENOL) 325 MG tablet Take 2 tablets (650 mg total) by mouth every 6 (six) hours as needed. 07/31/22  Yes Tanda Rockers A, DO  carvedilol (COREG) 12.5 MG tablet TAKE 1 TABLET(12.5 MG) BY MOUTH TWICE DAILY WITH A MEAL Patient taking  differently: Take 12.5 mg by mouth 2 (two) times daily with a meal. 06/11/22  Yes Laurey Morale, MD  digoxin (LANOXIN) 0.125 MG tablet Take 1 tablet (0.125 mg total) by mouth daily. 12/14/21  Yes Clegg, Amy D, NP  ENTRESTO 97-103 MG TAKE 1 TABLET BY MOUTH TWICE DAILY Patient taking differently: Take 1 tablet by mouth 2 (two) times daily. 11/09/21  Yes Laurey Morale, MD  eplerenone (INSPRA) 50 MG tablet Take 1 tablet (50 mg total) by mouth daily. 05/02/21  Yes Laurey Morale, MD  FARXIGA 10 MG TABS tablet TAKE 1 TABLET(10 MG) BY MOUTH DAILY BEFORE BREAKFAST Patient taking differently: Take 10 mg by mouth daily. 05/08/21  Yes Laurey Morale, MD  methocarbamol (ROBAXIN) 500 MG tablet Take 2 tablets (1,000 mg total) by mouth 2 (two) times daily for 5 days. 07/31/22 08/05/22 Yes Tanda Rockers A, DO  sertraline (ZOLOFT) 25 MG tablet Take 25 mg by mouth daily. 07/19/20  Yes [provider]  HYDROcodone-acetaminophen (NORCO/VICODIN) 5-325 MG tablet Take 1 tablet by mouth every 4 (four) hours as needed. 01/16/22   Idol, Raynelle Fanning, PA-C  meclizine (ANTIVERT) 25 MG tablet TAKE 1/2 TABLET(12.5 MG) BY MOUTH THREE TIMES DAILY AS NEEDED FOR DIZZINESS Patient taking differently: Take 12.5 mg by mouth in the morning, at noon, and at bedtime. 05/03/21   Marinus Maw, MD  meloxicam (MOBIC) 15 MG tablet TAKE 1 TABLET(15 MG) BY MOUTH DAILY Patient  taking differently: Take 15 mg by mouth daily. 12/26/21   Oliver Barre, MD  ondansetron (ZOFRAN) 4 MG tablet Take 1 tablet (4 mg total) by mouth every 8 (eight) hours as needed for nausea or vomiting. 01/16/22   Burgess Amor, PA-C  Vitamin D, Ergocalciferol, (DRISDOL) 1.25 MG (50000 UNIT) CAPS capsule Take 50,000 Units by mouth once a week. 03/30/21   [provider]      Allergies    Spironolactone    Review of Systems   Review of Systems  Constitutional:  Negative for chills and fever.  HENT:  Negative for facial swelling and trouble swallowing.   Eyes:   Negative for photophobia and visual disturbance.  Respiratory:  Negative for cough.   Cardiovascular:  Negative for chest pain and palpitations.  Gastrointestinal:  Positive for abdominal pain, diarrhea and nausea. Negative for vomiting.  Endocrine: Negative for polydipsia and polyuria.  Genitourinary:  Negative for difficulty urinating and hematuria.  Musculoskeletal:  Positive for arthralgias. Negative for gait problem and joint swelling.  Skin:  Negative for pallor and rash.  Neurological:  Negative for syncope and headaches.  Psychiatric/Behavioral:  Negative for agitation and confusion.     Physical Exam Updated Vital Signs BP (!) 138/102   Pulse 67   Temp 98.5 F (36.9 C) (Oral)   Resp 13   Ht 6' (1.829 m)   Wt 91.6 kg   SpO2 100%   BMI 27.40 kg/m  Physical Exam Vitals and nursing note reviewed.  Constitutional:      General: He is not in acute distress.    Appearance: He is well-developed.  HENT:     Head: Normocephalic and atraumatic.     Right Ear: External ear normal.     Left Ear: External ear normal.     Mouth/Throat:     Mouth: Mucous membranes are moist.  Eyes:     General: No scleral icterus. Cardiovascular:     Rate and Rhythm: Normal rate and regular rhythm.     Pulses: Normal pulses.     Heart sounds: Normal heart sounds.  Pulmonary:     Effort: Pulmonary effort is normal. No tachypnea, accessory muscle usage or respiratory distress.     Breath sounds: Normal breath sounds. No decreased breath sounds or wheezing.  Abdominal:     General: Abdomen is flat.     Palpations: Abdomen is soft.     Tenderness: There is no abdominal tenderness. There is no guarding or rebound. Negative signs include Murphy's sign.  Musculoskeletal:        General: Normal range of motion.     Cervical back: Normal range of motion.     Right lower leg: No edema.     Left lower leg: No edema.  Skin:    General: Skin is warm and dry.     Capillary Refill: Capillary refill  takes less than 2 seconds.  Neurological:     Mental Status: He is alert and oriented to person, place, and time.     GCS: GCS eye subscore is 4. GCS verbal subscore is 5. GCS motor subscore is 6.  Psychiatric:        Mood and Affect: Mood normal.        Behavior: Behavior normal.     ED Results / Procedures / Treatments   Labs (all labs ordered are listed, but only abnormal results are displayed) Labs Reviewed  BASIC METABOLIC PANEL - Abnormal; Notable for the following components:  Result Value   Glucose, Bld 110 (*)    All other components within normal limits  RESP PANEL BY RT-PCR (RSV, FLU A&B, COVID)  RVPGX2  CBC  URINALYSIS, ROUTINE W REFLEX MICROSCOPIC  TROPONIN I (HIGH SENSITIVITY)  TROPONIN I (HIGH SENSITIVITY)    EKG None  Radiology DG Chest 2 View  Result Date: 07/31/2022 CLINICAL DATA:  Fever with cough and shortness of breath for 3 days. EXAM: CHEST - 2 VIEW COMPARISON:  Radiographs 06/22/2021 and 06/20/2021.  CT 06/22/2021. FINDINGS: Left subclavian AICD lead appears unchanged, projecting to the right ventricular apex. The heart size and mediastinal contours are stable. The lungs are clear. There is no pleural effusion or pneumothorax. No acute osseous findings are evident. There are old rib fractures bilaterally. IMPRESSION: No evidence of acute cardiopulmonary process. AICD lead appears unchanged. Electronically Signed   By: Carey Bullocks M.D.   On: 07/31/2022 09:28    Procedures Procedures    Medications Ordered in ED Medications  HYDROcodone-acetaminophen (NORCO/VICODIN) 5-325 MG per tablet 1 tablet (1 tablet Oral Given 07/31/22 1308)    ED Course/ Medical Decision Making/ A&P                           Medical Decision Making Amount and/or Complexity of Data Reviewed Labs: ordered. Radiology: ordered.  Risk OTC drugs. Prescription drug management.   This patient presents to the ED with chief complaint(s) of low grade fver, abd pain,  n/diarrhea with pertinent past medical history of as above which further complicates the presenting complaint. The complaint involves an extensive differential diagnosis and also carries with it a high risk of complications and morbidity.     Differential diagnosis includes but is not exclusive to acute appendicitis, renal colic, testicular torsion, urinary tract infection, prostatitis,  diverticulitis, small bowel obstruction, colitis, abdominal aortic aneurysm, gastroenteritis, constipation etc.  . Serious etiologies were considered.   The initial plan is to screening labs/imaging   Additional history obtained: Additional history obtained from  na Records reviewed Primary Care Documents and prior ed eval, prior labs/imaging  Independent labs interpretation:  The following labs were independently interpreted:  Trop neg x2 Cbc/bmp were stable Rvp neg Ua neg   Independent visualization of imaging: - I independently visualized the following imaging with scope of interpretation limited to determining acute life threatening conditions related to emergency care: cxr, which revealed no acute process  Cardiac monitoring was reviewed and interpreted by myself which shows nsr  Treatment and Reassessment: Analgesia Po chall >> feeling better, tolerating PO, arthralgias improved  Consultation: - Consulted or discussed management/test interpretation w/ external professional: na  Consideration for admission or further workup: Admission was considered   The patient's chest pain is not suggestive of pulmonary embolus, cardiac ischemia, aortic dissection, pericarditis, myocarditis, pulmonary embolism, pneumothorax, pneumonia, Zoster, or esophageal perforation, or other serious etiology.  Historically not abrupt in onset, tearing or ripping, pulses symmetric. EKG nonspecific for ischemia/infarction. No dysrhythmias, brugada, WPW, prolonged QT noted.  Troponin negative x2. CXR reviewed. Labs  without demonstration of acute pathology unless otherwise noted above. Likely atypical.   Patient presents with nausea, diarrhea, and abdominal cramping. Sx suggestive of enteritis or food born illness. Surgical or other more serious etiology appears very unlikely. The patient is improved with ED treatment. Will discharge with observation and symptomatic treatment. Abdominal pain warnings discussed.  The patient improved significantly and was discharged in stable condition. Detailed discussions were had with the  patient regarding current findings, and need for close f/u with PCP or on call doctor. The patient has been instructed to return immediately if the symptoms worsen in any way for re-evaluation. Patient verbalized understanding and is in agreement with current care plan. All questions answered prior to discharge.     Social Determinants of health: Social History   Tobacco Use   Smoking status: Former    Packs/day: 0.50    Types: Cigarettes    Quit date: 10/07/2017    Years since quitting: 4.8   Smokeless tobacco: Never  Vaping Use   Vaping Use: Never used  Substance Use Topics   Alcohol use: Yes    Comment: occasionally    Drug use: No            Final Clinical Impression(s) / ED Diagnoses Final diagnoses:  Enteritis  Viral syndrome    Rx / DC Orders ED Discharge Orders          Ordered    methocarbamol (ROBAXIN) 500 MG tablet  2 times daily        07/31/22 1537    acetaminophen (TYLENOL) 325 MG tablet  Every 6 hours PRN        07/31/22 1537              Sloan Leiter, DO 07/31/22 1554    Sloan Leiter, DO 07/31/22 1554

## 2022-08-18 NOTE — Progress Notes (Unsigned)
Cardiology Office Note Date:  08/20/2022  Patient ID:  Andrew Hinton, DOB 1976/07/03, MRN [id number] PCP:  [facility]  Cardiologist:  Loralie Champagne, MD Electrophysiologist: Cristopher Peru, MD   Chief Complaint: 1 year f/up  History of Present Illness: Andrew Hinton is a 47 y.o. male with PMH notable for chronic HFrEF, anxiety, depression, h/o ETOH abuse, h/o tobacco abuse, syncope; seen today for Cristopher Peru, MD for routine electrophysiology followup. Since last being seen in our clinic the patient reports doing well.    Briefly, he was admitted 2/21 after syncopal episode and DOE. Had small SAH and rib fracture. Had echo that showed severely reduced EF, new onset acute systolic heart failure. Cardiac cath was negative for coronary disease, and showed low filling pressures and adequate cardiac output. Discharged with Life Vest given concern that syncope was arrhythmic. Started low dose HF meds. Repeat echo 6/21 continued to show reduced EF, thus ICD was recommended, s/p ICD 8/21  Last saw HF NP Clegg 12/2021 doing well, stable weight, taking meds, not drinking ETOH, working full time as Dealer.  Today, he reports having increased SOB, increased coughing, decreased energy for the past week or so. Also having bloating though not worsened lower extremity edema. He does not sleep on his back d/t back pain. He last palpitations at least daily if not more often.  Denies CP, N/V, dizziness, syncope, and presyncope.   He has not been drinking ETOH, smoking reduced to a couple puffs a couple times a week when stressed. He is taking medications, uses phone alarms for reminders.  Device Information: [employer] ICD, impl 2021; primary prevention   Past Medical History:  Diagnosis Date   Anxiety    Asthma    Depression    Nerve damage     Past Surgical History:  Procedure Laterality Date   APPENDECTOMY     ICD IMPLANT N/A 03/22/2020   Procedure: ICD IMPLANT;  Surgeon:  Evans Lance, MD;  Location: [place] [facility];  Service: Cardiovascular;  Laterality: N/A;   RIGHT/LEFT HEART CATH AND CORONARY ANGIOGRAPHY N/A 10/12/2019   Procedure: RIGHT/LEFT HEART CATH AND CORONARY ANGIOGRAPHY;  Surgeon: Larey Dresser, MD;  Location: [place] [facility];  Service: Cardiovascular;  Laterality: N/A;    Current Outpatient Medications  Medication Sig Dispense Refill   acetaminophen (TYLENOL) 325 MG tablet Take 2 tablets (650 mg total) by mouth every 6 (six) hours as needed. 36 tablet 0   carvedilol (COREG) 12.5 MG tablet TAKE 1 TABLET(12.5 MG) BY MOUTH TWICE DAILY WITH A MEAL (Patient taking differently: Take 12.5 mg by mouth 2 (two) times daily with a meal.) 60 tablet 3   digoxin (LANOXIN) 0.125 MG tablet Take 1 tablet (0.125 mg total) by mouth daily. 90 tablet 3   ENTRESTO 97-103 MG TAKE 1 TABLET BY MOUTH TWICE DAILY (Patient taking differently: Take 1 tablet by mouth 2 (two) times daily.) 60 tablet 5   eplerenone (INSPRA) 50 MG tablet Take 1 tablet (50 mg total) by mouth daily. 90 tablet 3   HYDROcodone-acetaminophen (NORCO/VICODIN) 5-325 MG tablet Take 1 tablet by mouth every 4 (four) hours as needed. 12 tablet 0   meclizine (ANTIVERT) 25 MG tablet TAKE 1/2 TABLET(12.5 MG) BY MOUTH THREE TIMES DAILY AS NEEDED FOR DIZZINESS (Patient taking differently: Take 12.5 mg by mouth in the morning, at noon, and at bedtime.) 30 tablet 6   meloxicam (MOBIC) 15 MG tablet TAKE 1 TABLET(15 MG) BY MOUTH DAILY (Patient  taking differently: Take 15 mg by mouth daily.) 30 tablet 0   ondansetron (ZOFRAN) 4 MG tablet Take 1 tablet (4 mg total) by mouth every 8 (eight) hours as needed for nausea or vomiting. 12 tablet 0   sertraline (ZOLOFT) 25 MG tablet Take 25 mg by mouth daily.     Vitamin D, Ergocalciferol, (DRISDOL) 1.25 MG (50000 UNIT) CAPS capsule Take 50,000 Units by mouth once a week.     FARXIGA 10 MG TABS tablet TAKE 1 TABLET(10 MG) BY MOUTH DAILY BEFORE BREAKFAST (Patient not  taking: Reported on 08/20/2022) 30 tablet 11   No current facility-administered medications for this visit.    Allergies:   Spironolactone   Social History:  The patient  reports that he quit smoking about 4 years ago. His smoking use included cigarettes. He smoked an average of .5 packs per day. He has never used smokeless tobacco. He reports current alcohol use. He reports that he does not use drugs.   Family History:  The patient's family history is not on file.  ROS:  Please see the history of present illness. All other systems are reviewed and otherwise negative.   PHYSICAL EXAM:  VS:  BP 124/82   Pulse 82   Ht 6' (1.829 m)   Wt 203 lb (92.1 kg)   SpO2 97%   BMI 27.53 kg/m  BMI: Body mass index is 27.53 kg/m.  GEN- The patient appears older than stated age, alert and oriented x 3 today.   HEENT: normocephalic, atraumatic; sclera clear, conjunctiva pink; hearing intact; oropharynx clear; neck supple, no JVP Lungs- Clear to ausculation bilaterally, normal work of breathing.  No wheezes, rales, rhonchi Heart- Regular rate and rhythm, no murmurs, rubs or gallops, PMI not laterally displaced GI- soft, non-tender, non-distended, bowel sounds present, no hepatosplenomegaly Extremities- Trace peripheral edema. no clubbing or cyanosis; DP/PT/radial pulses 2+ bilaterally MS- no significant deformity or atrophy Skin- warm and dry, no rash or lesion Psych- euthymic mood, full affect Neuro- strength and sensation are intact   Device interrogation done today and reviewed by myself:  Battery good Lead thresholds, impedence, sensing stable  One brief, NSVT episode No changes made today  EKG is not ordered. Personal review of EKG from  08/10/2022  shows:  NSR, rate 81bpm; narrow QRS  Recent Labs: 01/16/2022: ALT 35 07/31/2022: BUN 15; Creatinine, Ser 1.08; Hemoglobin 15.4; Platelets 352; Potassium 4.4; Sodium 136  No results found for requested labs within last 365 days.   Estimated  Creatinine Clearance: 94.8 mL/min (by C-G formula based on SCr of 1.08 mg/dL).   Wt Readings from Last 3 Encounters:  08/20/22 203 lb (92.1 kg)  07/31/22 202 lb (91.6 kg)  02/08/22 204 lb (92.5 kg)     Additional studies reviewed include: Previous EP, HF cardiology notes.   TTE 08/27/2020  1. Left ventricular ejection fraction, by estimation, is 25 to 30%. The left ventricle has severely decreased function. The left ventricle demonstrates global hypokinesis. Left ventricular diastolic parameters are indeterminate.   2. Right ventricular systolic function is normal. The right ventricular size is normal. There is normal pulmonary artery systolic pressure.   3. The mitral valve is normal in structure. Trivial mitral valve  regurgitation. No evidence of mitral stenosis.   4. The aortic valve is tricuspid. Aortic valve regurgitation is not visualized. No aortic stenosis is present.   5. The inferior vena cava is normal in size with greater than 50% respiratory variability, suggesting right atrial pressure of  3 mmHg.   PPM Implant 03/22/2020 CONCLUSIONS:   1. Ischemic cardiomyopathy with chronic New York Heart Association class II heart failure.   2. Successful ICD implantation.   3. DFT less than or equal to 20 joules.   4. No early apparent complications.   [facility] 10/12/2019 1. Low filling pressures.  2. Preserved cardiac output.  3. No significant coronary disease.    Nonischemic cardiomyopathy.   ASSESSMENT AND PLAN:  #) HFrEF Appears euvolemic on exam.  On appropriate GDMT Encouraged to take daily weights, monitor salt and fluid intake Follows with [facility] HF team, past due for follow-up & updated echo  #) ICD in situ Device functioning well See paceart for details  #) ETOH abuse  tobacco abuse Congratulated on reduction in use Encouraged to continue to reduce usage   Current medicines are reviewed at length with the patient today.   The patient does not have concerns  regarding his medicines.  The following changes were made today:  none  Labs/ tests ordered today include:  No orders of the defined types were placed in this encounter.    Disposition: Follow up with Dr. Ladona Ridgel in 12 months   Signed, Sherie Don, NP  08/20/22 12:18 PM   [facility] [address] [place] [address] [phone] (office)  [phone] (fax)

## 2022-08-20 ENCOUNTER — Ambulatory Visit: Payer: Medicaid Other | Attending: Student | Admitting: Cardiology

## 2022-08-20 ENCOUNTER — Encounter: Payer: Self-pay | Admitting: Student

## 2022-08-20 VITALS — BP 124/82 | HR 82 | Ht 72.0 in | Wt 203.0 lb

## 2022-08-20 DIAGNOSIS — I5042 Chronic combined systolic (congestive) and diastolic (congestive) heart failure: Secondary | ICD-10-CM

## 2022-08-20 DIAGNOSIS — Z95810 Presence of automatic (implantable) cardiac defibrillator: Secondary | ICD-10-CM | POA: Diagnosis not present

## 2022-08-20 LAB — CUP PACEART INCLINIC DEVICE CHECK
Date Time Interrogation Session: 20240108125257
HighPow Impedance: 81 Ohm
HighPow Impedance: 92 Ohm
Implantable Lead Connection Status: 753985
Implantable Lead Implant Date: 20210810
Implantable Lead Location: 753860
Implantable Lead Model: 273
Implantable Lead Serial Number: 111079
Implantable Pulse Generator Implant Date: 20210810
Lead Channel Impedance Value: 467 Ohm
Lead Channel Pacing Threshold Amplitude: 1 V
Lead Channel Pacing Threshold Pulse Width: 0.4 ms
Lead Channel Sensing Intrinsic Amplitude: 22.5 mV
Lead Channel Setting Pacing Amplitude: 2.5 V
Lead Channel Setting Pacing Pulse Width: 0.4 ms
Lead Channel Setting Sensing Sensitivity: 0.5 mV
Pulse Gen Serial Number: 278671
Zone Setting Status: 755011

## 2022-08-20 NOTE — Patient Instructions (Signed)
Medication Instructions:  Your physician recommends that you continue on your current medications as directed. Please refer to the Current Medication list given to you today.  *If you need a refill on your cardiac medications before your next appointment, please call your pharmacy*   Lab Work: None If you have labs (blood work) drawn today and your tests are completely normal, you will receive your results only by: [place] (if you have MyChart) OR A paper copy in the mail If you have any lab test that is abnormal or we need to change your treatment, we will call you to review the results.   Follow-Up: At [facility], you and your health needs are our priority.  As part of our continuing mission to provide you with exceptional heart care, we have created designated Provider Care Teams.  These Care Teams include your primary Cardiologist (physician) and Advanced Practice Providers (APPs -  Physician Assistants and Nurse Practitioners) who all work together to provide you with the care you need, when you need it.  We recommend signing up for the patient portal called "MyChart".  Sign up information is provided on this After Visit Summary.  MyChart is used to connect with patients for Virtual Visits (Telemedicine).  Patients are able to view lab/test results, encounter notes, upcoming appointments, etc.  Non-urgent messages can be sent to your provider as well.   To learn more about what you can do with MyChart, go to [email or web address].    Your next appointment:    Please schedule HF Clinic f/u first available and 1 year(s)  The format for your next appointment:   In Person  Provider:   Cristopher Peru, MD     Important Information About Sugar

## 2022-09-19 ENCOUNTER — Telehealth (HOSPITAL_COMMUNITY): Payer: Self-pay | Admitting: *Deleted

## 2022-09-19 NOTE — Telephone Encounter (Signed)
Echo auth request faxed to [facility]

## 2022-09-20 ENCOUNTER — Ambulatory Visit (HOSPITAL_BASED_OUTPATIENT_CLINIC_OR_DEPARTMENT_OTHER)
Admission: RE | Admit: 2022-09-20 | Discharge: 2022-09-20 | Disposition: A | Discharge status: Home / Self Care | Payer: Medicaid Other | Source: Ambulatory Visit | Attending: Cardiology | Admitting: Cardiology

## 2022-09-20 ENCOUNTER — Ambulatory Visit (INDEPENDENT_AMBULATORY_CARE_PROVIDER_SITE_OTHER): Payer: Medicaid Other

## 2022-09-20 ENCOUNTER — Other Ambulatory Visit (HOSPITAL_COMMUNITY): Payer: Self-pay

## 2022-09-20 ENCOUNTER — Encounter (HOSPITAL_COMMUNITY): Payer: Self-pay

## 2022-09-20 ENCOUNTER — Ambulatory Visit (HOSPITAL_COMMUNITY)
Admission: RE | Admit: 2022-09-20 | Discharge: 2022-09-20 | Disposition: A | Discharge status: Home / Self Care | Payer: Medicaid Other | Source: Ambulatory Visit | Attending: Cardiology | Admitting: Cardiology

## 2022-09-20 ENCOUNTER — Encounter (HOSPITAL_COMMUNITY): Payer: Self-pay | Admitting: Cardiology

## 2022-09-20 VITALS — BP 120/78 | HR 89 | Wt 196.6 lb

## 2022-09-20 DIAGNOSIS — F1010 Alcohol abuse, uncomplicated: Secondary | ICD-10-CM | POA: Diagnosis not present

## 2022-09-20 DIAGNOSIS — I428 Other cardiomyopathies: Secondary | ICD-10-CM | POA: Insufficient documentation

## 2022-09-20 DIAGNOSIS — F17200 Nicotine dependence, unspecified, uncomplicated: Secondary | ICD-10-CM | POA: Insufficient documentation

## 2022-09-20 DIAGNOSIS — Z95810 Presence of automatic (implantable) cardiac defibrillator: Secondary | ICD-10-CM | POA: Diagnosis not present

## 2022-09-20 DIAGNOSIS — I5022 Chronic systolic (congestive) heart failure: Secondary | ICD-10-CM | POA: Diagnosis not present

## 2022-09-20 DIAGNOSIS — F419 Anxiety disorder, unspecified: Secondary | ICD-10-CM | POA: Diagnosis not present

## 2022-09-20 DIAGNOSIS — F32A Depression, unspecified: Secondary | ICD-10-CM | POA: Diagnosis not present

## 2022-09-20 DIAGNOSIS — Z8249 Family history of ischemic heart disease and other diseases of the circulatory system: Secondary | ICD-10-CM | POA: Insufficient documentation

## 2022-09-20 DIAGNOSIS — J45909 Unspecified asthma, uncomplicated: Secondary | ICD-10-CM | POA: Diagnosis not present

## 2022-09-20 DIAGNOSIS — Z79899 Other long term (current) drug therapy: Secondary | ICD-10-CM | POA: Insufficient documentation

## 2022-09-20 DIAGNOSIS — I38 Endocarditis, valve unspecified: Secondary | ICD-10-CM | POA: Diagnosis not present

## 2022-09-20 DIAGNOSIS — I5042 Chronic combined systolic (congestive) and diastolic (congestive) heart failure: Secondary | ICD-10-CM

## 2022-09-20 LAB — BASIC METABOLIC PANEL
Anion gap: 12 (ref 5–15)
BUN: 13 mg/dL (ref 6–20)
CO2: 24 mmol/L (ref 22–32)
Calcium: 9.6 mg/dL (ref 8.9–10.3)
Chloride: 100 mmol/L (ref 98–111)
Creatinine, Ser: 1.12 mg/dL (ref 0.61–1.24)
GFR, Estimated: >60 mL/min (ref >60)
Glucose, Bld: 115 mg/dL — ABNORMAL HIGH (ref 70–99)
Potassium: 4.1 mmol/L (ref 3.5–5.1)
Sodium: 136 mmol/L (ref 135–145)

## 2022-09-20 LAB — CUP PACEART REMOTE DEVICE CHECK
Battery Remaining Longevity: 162 mo
Battery Remaining Percentage: 100 %
Brady Statistic RV Percent Paced: 0 %
Date Time Interrogation Session: 20240208101400
HighPow Impedance: 93 Ohm
Implantable Lead Connection Status: 753985
Implantable Lead Implant Date: 20210810
Implantable Lead Location: 753860
Implantable Lead Model: 273
Implantable Lead Serial Number: 111079
Implantable Pulse Generator Implant Date: 20210810
Lead Channel Impedance Value: 470 Ohm
Lead Channel Setting Pacing Amplitude: 2.5 V
Lead Channel Setting Pacing Pulse Width: 0.4 ms
Lead Channel Setting Sensing Sensitivity: 0.5 mV
Pulse Gen Serial Number: 278671
Zone Setting Status: 755011

## 2022-09-20 LAB — DIGOXIN LEVEL: Digoxin Level: <0.2 ng/mL — ABNORMAL LOW (ref 0.8–2.0)

## 2022-09-20 LAB — BRAIN NATRIURETIC PEPTIDE: B Natriuretic Peptide: 68.4 pg/mL (ref 0.0–100.0)

## 2022-09-20 MED ORDER — DAPAGLIFLOZIN PROPANEDIOL 10 MG PO TABS: ORAL_TABLET | ORAL | 11 refills | Status: AC

## 2022-09-20 MED ORDER — CARVEDILOL 12.5 MG PO TABS: 18.7500 mg | ORAL_TABLET | Freq: Two times a day (BID) | ORAL | 3 refills | Status: AC

## 2022-09-20 NOTE — Progress Notes (Signed)
PCP: [facility] Primary Cardiologist: Dr. Aundra Dubin   HPI: Mr [name] is a 47 y.o. with history of anxiety, asthma, and depression. Previously a heavy drinker and smoker.  No longer has driver's license due to multiple DWIs.    Admitted 10/08/19 after syncopal episode and dyspnea with exertion. Had small SAH and rib fracture. Had echo that showed severely reduced EF, new onset acute systolic heart failure. Cardiac cath was negative for coronary disease, and showed low filling pressures and adequate cardiac output. Discharged with Life Vest given concern that syncope was arrhythmic. Started low dose HF meds.   Was evaluated at [facility] ED in 3/21 due to dyspnea and rib pain. New rib fracture was noted on the left. SAH had resolved. CXR and CTA negative. He was discharged to home.   Echo in 6/21 showed that EF remained 30%. Now with [employer] ICD in 8/21.  Echo in 1/22 showed EF 25-30%.    Today he returns for HF follow up.  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  He ran out of [place] several months ago, taking other meds.  Weight is down 7 lbs. No significant exertional dyspnea or chest pain.  No orthopnea/PND.  No smoking or ETOH.    ECG (personally reviewed): NSR, LVH  [employer] device interrogation: Heartlogic score 0   Labs (6/21): K 4.7, creatinine 1.15 Labs (7/22): K 4.5, creatinine 1.01 Labs (11/22): K 4.0, creatinine 1.06 Labs (12/23): K 4.4, creatinine 1.08  PMH: 1. Anxiety 2. Asthma 3. H/o ETOH abuse.  4. Chronic systolic CHF: Echo (1/82) with EF < 20%, normal RV.  Nonischemic cardiomyopathy.  - RHC/LHC (3/21): No significant CAD; mean RA 3, PA 24/5, mean PCWP 3, CI 2.3.  - Cardiac MRI (3/21): EF 25%, normal RV, no LGE.  - Echo (6/21): EF 30%, diffuse hypokinesis, normal RV, normal IVC.  [employer] ICD.  - Echo (1/22): EF 25-30% 5. Syncope: Has ICD 6. Traumatic SAH in 2/21.  7. COVID-19 1/22  SH: Does not drive. No longer smokes.  Stopped drinking in 2/21, prior heavy use. Lives with son, mother in law, and father in [employer]. Difficulty paying for medications. Mechanic.    FH: Mom and 2 sister have heart disease. He has not idea what kind of heart problems. Mother deceased ? Cancer.   ROS: All systems negative except as listed in HPI, PMH and Problem List.   Current Outpatient Medications  Medication Sig Dispense Refill   acetaminophen (TYLENOL) 325 MG tablet Take 2 tablets (650 mg total) by mouth every 6 (six) hours as needed. 36 tablet 0   digoxin (LANOXIN) 0.125 MG tablet Take 1 tablet (0.125 mg total) by mouth daily. 90 tablet 3   ENTRESTO 97-103 MG TAKE 1 TABLET BY MOUTH TWICE DAILY 60 tablet 5   eplerenone (INSPRA) 50 MG tablet Take 1 tablet (50 mg total) by mouth daily. 90 tablet 3   HYDROcodone-acetaminophen (NORCO/VICODIN) 5-325 MG tablet Take 1 tablet by mouth every 4 (four) hours as needed. 12 tablet 0   meloxicam (MOBIC) 15 MG tablet TAKE 1 TABLET(15 MG) BY MOUTH DAILY 30 tablet 0   ondansetron (ZOFRAN) 4 MG tablet Take 1 tablet (4 mg total) by mouth every 8 (eight) hours as needed for nausea or vomiting. 12 tablet 0   sertraline (ZOLOFT) 25 MG tablet Take 25 mg by mouth daily.     Vitamin D, Ergocalciferol, (DRISDOL) 1.25 MG (50000 UNIT) CAPS capsule Take 50,000 Units by  mouth once a week.     carvedilol (COREG) 12.5 MG tablet Take 1.5 tablets (18.75 mg total) by mouth 2 (two) times daily with a meal. 90 tablet 3   dapagliflozin propanediol (FARXIGA) 10 MG TABS tablet TAKE 1 TABLET(10 MG) BY MOUTH DAILY BEFORE BREAKFAST 30 tablet 11   No current facility-administered medications for this encounter.   Wt Readings from Last 3 Encounters:  09/20/22 89.2 kg (196 lb 9.6 oz)  08/20/22 92.1 kg (203 lb)  07/31/22 91.6 kg (202 lb)   BP 120/78   Pulse 89   Wt 89.2 kg (196 lb 9.6 oz)   SpO2 95%   BMI 26.66 kg/m  General: NAD Neck: No JVD, no thyromegaly or thyroid nodule.  Lungs: Clear to auscultation  bilaterally with normal respiratory effort. CV: Nondisplaced PMI.  Heart regular S1/S2, no S3/S4, no murmur.  No peripheral edema.  No carotid bruit.  Normal pedal pulses.  Abdomen: Soft, nontender, no hepatosplenomegaly, no distention.  Skin: Intact without lesions or rashes.  Neurologic: Alert and oriented x 3.  Psych: Normal affect. Extremities: No clubbing or cyanosis.  HEENT: Normal.   Assessment/Plan: 1. Chronic Systolic HF:  Nonischemic cardiomyopathy. Echo in 2/21 showed LV dilation with EF < 20%, the RV appeared normal. Given the LV dilation, I suspected that the cardiomyopathy was not new (probably less likely to be a stress cardiomyopathy related to the subarachnoid hemorrhage). 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  Cannot rule out viral cardiomyopathy.  He has multiple family members with "heart disease" but he does not have details. Coronary angiography did not show any significant CAD.  Cardiac MRI showed no infiltrative disease, no prior MI, EF 25%, RV normal.  Echo in 6/21 showed EF 30% with diffuse hypokinesis, mild LV dilation.  Echo in 1/22 with EF 25-30%.  He had a [place] placed.  NYHA class I symptoms, not volume overloaded by exam or HeartLogic.  - Continue Entresto 97/103 mg bid.  - Continue digoxin 0.125 mg daily. Check level today.  - Continue eplerenone 50 mg daily. - Increase Coreg to 18.75 mg bid. - Restart Farxiga 10 mg daily. BMET today and again in 10 days.  - I will arrange for echo.  2. Syncope: Initial presentation with syncope, no prodrome. No recurrence.  Now has ICD.  3. SAH: Small volume SAH post-syncope in 2/21.  4. ETOH abuse: No longer drinking   Followup in 4 months with APP.   Andrew Hinton  09/20/2022

## 2022-09-20 NOTE — Patient Instructions (Addendum)
INCREASE Coreg to 18.75 mg (one and one half tab) twice a day RESTART Farxiga 10 mg one tab daily  Labs today We will only contact you if something comes back abnormal or we need to make some changes. Otherwise no news is good news!  Your physician has requested that you have an echocardiogram. Echocardiography is a painless test that uses sound waves to create images of your heart. It provides your doctor with information about the size and shape of your heart and how well your heart's chambers and valves are working. This procedure takes approximately one hour. There are no restrictions for this procedure. Please do NOT wear cologne, perfume, aftershave, or lotions (deodorant is allowed). Please arrive 15 minutes prior to your appointment time.  Your physician recommends that you schedule a follow-up appointment in: 4 months  in the Advanced Practitioners (PA/NP) Clinic     Do the following things EVERYDAY: Weigh yourself in the morning before breakfast. Write it down and keep it in a log. Take your medicines as prescribed Eat low salt foods--Limit salt (sodium) to 2000 mg per day.  Stay as active as you can everyday Limit all fluids for the day to less than 2 liters  At the [facility], you and your health needs are our priority. As part of our continuing mission to provide you with exceptional heart care, we have created designated Provider Care Teams. These Care Teams include your primary Cardiologist (physician) and Advanced Practice Providers (APPs- Physician Assistants and Nurse Practitioners) who all work together to provide you with the care you need, when you need it.   You may see any of the following providers on your designated Care Team at your next follow up: Dr Glori Bickers Dr Loralie Champagne Dr. Roxana Hires, NP Lyda Jester, [place] [facility], [place] Forestine Na, NP Audry Riles, PharmD   Please be sure to bring  in all your medications bottles to every appointment.    Thank you for choosing [facility]   If you have any questions or concerns before your next appointment please send [place] a message through [place] or call our office at [phone].    TO LEAVE A MESSAGE FOR THE NURSE SELECT OPTION 2, PLEASE LEAVE A MESSAGE INCLUDING: YOUR NAME DATE OF BIRTH CALL BACK NUMBER REASON FOR CALL**this is important as we prioritize the call backs  YOU WILL RECEIVE A CALL BACK THE SAME DAY AS LONG AS YOU CALL BEFORE 4:00 PM

## 2022-09-21 ENCOUNTER — Telehealth (HOSPITAL_COMMUNITY): Payer: Self-pay

## 2022-09-21 NOTE — Telephone Encounter (Signed)
Patient aware of labs. He states he is taking his Digoxin daily.  With his labs low, did you want to change anything?

## 2022-09-21 NOTE — Telephone Encounter (Signed)
No leave alone.

## 2022-10-05 DIAGNOSIS — E1142 Type 2 diabetes mellitus with diabetic polyneuropathy: Secondary | ICD-10-CM | POA: Diagnosis not present

## 2022-10-05 DIAGNOSIS — E119 Type 2 diabetes mellitus without complications: Secondary | ICD-10-CM | POA: Diagnosis not present

## 2022-10-05 DIAGNOSIS — B353 Tinea pedis: Secondary | ICD-10-CM | POA: Diagnosis not present

## 2022-10-09 ENCOUNTER — Ambulatory Visit (HOSPITAL_COMMUNITY)
Admission: RE | Admit: 2022-10-09 | Discharge: 2022-10-09 | Disposition: A | Discharge status: Home / Self Care | Payer: Medicaid Other | Source: Ambulatory Visit | Attending: Cardiology | Admitting: Cardiology

## 2022-10-09 DIAGNOSIS — I5022 Chronic systolic (congestive) heart failure: Secondary | ICD-10-CM | POA: Insufficient documentation

## 2022-10-09 LAB — ECHOCARDIOGRAM COMPLETE
Area-P 1/2: 2.83 cm2
Calc EF: 35 %
S' Lateral: 5.4 cm
Single Plane A2C EF: 36.2 %
Single Plane A4C EF: 36.3 %

## 2022-10-09 NOTE — Progress Notes (Signed)
Echocardiogram 2D Echocardiogram has been performed.  Andrew Hinton 10/09/2022, 8:44 AM

## 2022-10-11 NOTE — Progress Notes (Signed)
Remote ICD transmission.   

## 2022-10-16 NOTE — Progress Notes (Unsigned)
GI Office Note    Referring Provider: Alanson Puls, [facility] Primary Care Physician:  [facility]  Primary Gastroenterologist: Cristopher Estimable.Rourk, MD  Chief Complaint   No chief complaint on file.   History of Present Illness   Andrew Hinton is a 47 y.o. male presenting today at the request of [place], [facility] for ***IBS-D and need for colonoscopy.  Labs May 2023: Hgb 13.4, MCV 92, Plts 360, normal LFTs  PCP referral 09/25/2022: ***Notes 5 loose BM daily, ,at least 3 prior to work in the mornings. Has abdominal pain 90% of the day. Increased stools no matter what he eats. Reports he walks a lot. Drinks 2 sodas daily. Advised to start dicyclomine '10mg'$  TID for IBS and abdominal pain. Referred to GI. CBC, CMP, TSH, vitamin d, PSA, A1C ordered.   Normal BMP 09/20/22  ECHO 10/09/22: -EF 30-35% -moderate LV dilation (Grade 2 diastolic dysfunction)  Today:    Current Outpatient Medications  Medication Sig Dispense Refill   acetaminophen (TYLENOL) 325 MG tablet Take 2 tablets (650 mg total) by mouth every 6 (six) hours as needed. 36 tablet 0   carvedilol (COREG) 12.5 MG tablet Take 1.5 tablets (18.75 mg total) by mouth 2 (two) times daily with a meal. 90 tablet 3   dapagliflozin propanediol (FARXIGA) 10 MG TABS tablet TAKE 1 TABLET(10 MG) BY MOUTH DAILY BEFORE BREAKFAST 30 tablet 11   digoxin (LANOXIN) 0.125 MG tablet Take 1 tablet (0.125 mg total) by mouth daily. 90 tablet 3   ENTRESTO 97-103 MG TAKE 1 TABLET BY MOUTH TWICE DAILY 60 tablet 5   eplerenone (INSPRA) 50 MG tablet Take 1 tablet (50 mg total) by mouth daily. 90 tablet 3   HYDROcodone-acetaminophen (NORCO/VICODIN) 5-325 MG tablet Take 1 tablet by mouth every 4 (four) hours as needed. 12 tablet 0   meloxicam (MOBIC) 15 MG tablet TAKE 1 TABLET(15 MG) BY MOUTH DAILY 30 tablet 0   ondansetron (ZOFRAN) 4 MG tablet Take 1 tablet (4 mg total) by mouth every 8 (eight) hours as needed for nausea or vomiting.  12 tablet 0   sertraline (ZOLOFT) 25 MG tablet Take 25 mg by mouth daily.     Vitamin D, Ergocalciferol, (DRISDOL) 1.25 MG (50000 UNIT) CAPS capsule Take 50,000 Units by mouth once a week.     No current facility-administered medications for this visit.    Past Medical History:  Diagnosis Date   Anxiety    Asthma    Depression    Nerve damage     Past Surgical History:  Procedure Laterality Date   APPENDECTOMY     ICD IMPLANT N/A 03/22/2020   Procedure: ICD IMPLANT;  Surgeon: Evans Lance, MD;  Location: [facility];  Service: Cardiovascular;  Laterality: N/A;   RIGHT/LEFT HEART CATH AND CORONARY ANGIOGRAPHY N/A 10/12/2019   Procedure: RIGHT/LEFT HEART CATH AND CORONARY ANGIOGRAPHY;  Surgeon: Larey Dresser, MD;  Location: [facility];  Service: Cardiovascular;  Laterality: N/A;    No family history on file.  Allergies as of 10/17/2022 - Review Complete 09/20/2022  Allergen Reaction Noted   Spironolactone  03/10/2020    Social History   Socioeconomic History   Marital status: Divorced    Spouse name: Not on file   Number of children: 2   Years of education: Not on file   Highest education level: Not on file  Occupational History   Not on file  Tobacco Use  Smoking status: Former    Packs/day: 0.50    Types: Cigarettes    Quit date: 10/07/2017    Years since quitting: 5.0   Smokeless tobacco: Never  Vaping Use   Vaping Use: Never used  Substance and Sexual Activity   Alcohol use: Yes    Comment: occasionally    Drug use: No   Sexual activity: Not on file  Other Topics Concern   Not on file  Social History Narrative   Not on file   Social Determinants of Health   Financial Resource Strain: Medium Risk (10/13/2019)   Overall Financial Resource Strain (CARDIA)    Difficulty of Paying Living Expenses: Somewhat hard  Food Insecurity: No Food Insecurity (10/13/2019)   Hunger Vital Sign    Worried About Running Out of Food in the Last Year: Never  true    Ran Out of Food in the Last Year: Never true  Transportation Needs: No Transportation Needs (10/13/2019)   PRAPARE - Hydrologist (Medical): No    Lack of Transportation (Non-Medical): No  Physical Activity: Not on file  Stress: Stress Concern Present (10/13/2019)   [place]    Feeling of Stress : Rather much  Social Connections: Not on file  Intimate Partner Violence: Not on file     Review of Systems   Gen: Denies any fever, chills, fatigue, weight loss, lack of appetite.  CV: Denies chest pain, heart palpitations, peripheral edema, syncope.  Resp: Denies shortness of breath at rest or with exertion. Denies wheezing or cough.  GI: see HPI GU : Denies urinary burning, urinary frequency, urinary hesitancy MS: Denies joint pain, muscle weakness, cramps, or limitation of movement.  Derm: Denies rash, itching, dry skin Psych: Denies depression, anxiety, memory loss, and confusion Heme: Denies bruising, bleeding, and enlarged lymph nodes.   Physical Exam   There were no vitals taken for this visit.  General:   Alert and oriented. Pleasant and cooperative. Well-nourished and well-developed.  Head:  Normocephalic and atraumatic. Eyes:  Without icterus, sclera clear and conjunctiva pink.  Ears:  Normal auditory acuity. Mouth:  No deformity or lesions, oral mucosa pink.  Lungs:  Clear to auscultation bilaterally. No wheezes, rales, or rhonchi. No distress.  Heart:  S1, S2 present without murmurs appreciated.  Abdomen:  +BS, soft, non-tender and non-distended. No HSM noted. No guarding or rebound. No masses appreciated.  Rectal:  Deferred *** Msk:  Symmetrical without gross deformities. Normal posture. Extremities:  Without edema. Neurologic:  Alert and  oriented x4;  grossly normal neurologically. Skin:  Intact without significant lesions or rashes. Psych:  Alert and cooperative.  Normal mood and affect.   Assessment   Andrew Hinton is a 47 y.o. 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   Diarrhea, possible IBS-D:   Screening for colon cancer:    PLAN   *** Request recent lab work from PCP? Lipase, TSH?, CRP, celiac serologies, fecal calprotectin, CRP, alpha gal?, stool studies?, testing for SIBO? Low FODMAP diet, low fat diet? Avoid lactose Alcohol cessation Proceed with colonoscopy with propofol by Dr. Gala Romney in near future: the risks, benefits, and alternatives have been discussed with the patient in detail. The patient states understanding and desires to proceed. ASA 4 (patient  has ICD in place) Cardiac clearance needed    Venetia Night, MSN, FNP-BC, AGACNP-BC [facility]

## 2022-10-17 ENCOUNTER — Encounter: Payer: Self-pay | Admitting: Gastroenterology

## 2022-10-17 ENCOUNTER — Telehealth: Payer: Self-pay | Admitting: *Deleted

## 2022-10-17 ENCOUNTER — Ambulatory Visit (INDEPENDENT_AMBULATORY_CARE_PROVIDER_SITE_OTHER): Payer: Self-pay | Admitting: Gastroenterology

## 2022-10-17 VITALS — BP 86/56 | HR 87 | Temp 98.1°F | Ht 72.0 in | Wt 197.8 lb

## 2022-10-17 DIAGNOSIS — K529 Noninfective gastroenteritis and colitis, unspecified: Secondary | ICD-10-CM

## 2022-10-17 DIAGNOSIS — K219 Gastro-esophageal reflux disease without esophagitis: Secondary | ICD-10-CM

## 2022-10-17 DIAGNOSIS — Z1211 Encounter for screening for malignant neoplasm of colon: Secondary | ICD-10-CM

## 2022-10-17 MED ORDER — DICYCLOMINE HCL 10 MG PO CAPS: 10.0000 mg | ORAL_CAPSULE | Freq: Three times a day (TID) | ORAL | 1 refills | Status: AC

## 2022-10-17 MED ORDER — PANTOPRAZOLE SODIUM 40 MG PO TBEC: 40.0000 mg | DELAYED_RELEASE_TABLET | Freq: Every day | ORAL | 1 refills | Status: DC

## 2022-10-17 NOTE — Telephone Encounter (Signed)
  Request for patient is needing cleareance  10/17/22  Andrew Hinton [birth year]  What type of surgery is being performed? Colonoscopy  When is surgery scheduled? TBD  Pt is needing cardiac clearance prior to scheduling procedure.  Name of physician performing surgery?  [facility] [employer]: [phone] Fax: [phone]  Anethesia type (none, local, MAC, general)? MAC

## 2022-10-17 NOTE — Telephone Encounter (Signed)
     Primary Cardiologist: Loralie Champagne, MD  Chart reviewed as part of pre-operative protocol coverage. Given past medical history and time since last visit, based on ACC/AHA guidelines, Andrew Hinton would be at acceptable risk for the planned procedure without further cardiovascular testing.   Patient was recently seen by Loralie Champagne 09/20/2022.  He remained stable from a cardiac standpoint at that time.  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  His RCRI is a class III risk, 6.6% risk of major cardiac event.  I will route this recommendation to the requesting party via Epic fax function and remove from pre-op pool.  Please call with questions.  Jossie Ng. Chino Sardo NP-C     10/17/2022, 11:56 AM [address] Office [phone] Fax [phone]

## 2022-10-17 NOTE — Patient Instructions (Addendum)
We are scheduling for colonoscopy in near future with Dr. Gala Romney.  You received 72 right instructions in the mail regarding your prep.  For your reflux and you start taking pantoprazole 40 mg once daily, 30 minutes prior to breakfast.  Please do not begin this until you get your stool sample to test for H. pylori.  Follow a GERD diet:  Avoid fried, fatty, greasy, spicy, citrus foods. Avoid caffeine and carbonated beverages. Avoid chocolate. Try eating 4-6 small meals a day rather than 3 large meals. Do not eat within 3 hours of laying down. Prop head of bed up on wood or bricks to create a 6 inch incline.  Please work toward alcohol cessation as this is likely contributing factor to your reflux and possibly diarrhea.  Try following a low FODMAP diet and avoiding lactose with your diet as this is also a likely contributing factor to your diarrhea.  You may need Lactaid tablets if consuming dairy products however switch to Lactaid milk.  I am ordering labs for you to have completed at [facility].  This to further evaluate your diarrhea.  I have sent in dicyclomine for you to take 10 mg up to 3 times a day as needed for abdominal pain and diarrhea.  Please hold in the setting of constipation.  We will plan to follow-up in 2 years 3 months, sooner if needed.  It was a pleasure to see you today. I want to create trusting relationships with patients. If you receive a survey regarding your visit,  I greatly appreciate you taking time to fill this out on paper or through your MyChart. I value your feedback.  Venetia Night, MSN, FNP-BC, AGACNP-BC [facility]

## 2022-10-19 NOTE — Telephone Encounter (Signed)
Please advise. Thank you

## 2022-10-22 LAB — CBC
Hematocrit: 43.8 % (ref 37.5–51.0)
Hemoglobin: 15 g/dL (ref 13.0–17.7)
MCH: 31.7 pg (ref 26.6–33.0)
MCHC: 34.2 g/dL (ref 31.5–35.7)
MCV: 93 fL (ref 79–97)
Platelets: 396 10*3/uL (ref 150–450)
RBC: 4.73 x10E6/uL (ref 4.14–5.80)
RDW: 12 % (ref 11.6–15.4)
WBC: 6.4 10*3/uL (ref 3.4–10.8)

## 2022-10-22 LAB — ALPHA-GAL PANEL
Allergen Lamb IgE: 0.2 kU/L — AB
Beef IgE: 0.23 kU/L — AB
IgE (Immunoglobulin E), Serum: 353 IU/mL (ref 6–495)
O215-IgE Alpha-Gal: 0.52 kU/L — AB
Pork IgE: 0.18 kU/L — AB

## 2022-10-22 LAB — COMPREHENSIVE METABOLIC PANEL
ALT: 33 IU/L (ref 0–44)
AST: 23 IU/L (ref 0–40)
Albumin/Globulin Ratio: 1.6 (ref 1.2–2.2)
Albumin: 4.6 g/dL (ref 4.1–5.1)
Alkaline Phosphatase: 59 IU/L (ref 44–121)
BUN/Creatinine Ratio: 18 (ref 9–20)
BUN: 20 mg/dL (ref 6–24)
Bilirubin Total: 0.3 mg/dL (ref 0.0–1.2)
CO2: 21 mmol/L (ref 20–29)
Calcium: 9.8 mg/dL (ref 8.7–10.2)
Chloride: 107 mmol/L — ABNORMAL HIGH (ref 96–106)
Creatinine, Ser: 1.1 mg/dL (ref 0.76–1.27)
Globulin, Total: 2.8 g/dL (ref 1.5–4.5)
Glucose: 96 mg/dL (ref 70–99)
Potassium: 4.9 mmol/L (ref 3.5–5.2)
Sodium: 143 mmol/L (ref 134–144)
Total Protein: 7.4 g/dL (ref 6.0–8.5)
eGFR: 84 mL/min/{1.73_m2} (ref >59)

## 2022-10-22 LAB — C-REACTIVE PROTEIN: CRP: <1 mg/L (ref 0–10)

## 2022-10-22 LAB — CELIAC DISEASE PANEL
Endomysial IgA: NEGATIVE
IgA/Immunoglobulin A, Serum: 346 mg/dL (ref 90–386)
Transglutaminase IgA: 5 U/mL — ABNORMAL HIGH (ref 0–3)

## 2022-10-22 LAB — TSH: TSH: 0.645 u[IU]/mL (ref 0.450–4.500)

## 2022-10-23 ENCOUNTER — Encounter: Payer: Self-pay | Admitting: *Deleted

## 2022-10-23 ENCOUNTER — Other Ambulatory Visit: Payer: Self-pay | Admitting: *Deleted

## 2022-10-23 MED ORDER — PEG 3350-KCL-NA BICARB-NACL 420 G PO SOLR: 4000.0000 mL | Freq: Once | ORAL | 0 refills | Status: AC

## 2022-10-23 NOTE — Telephone Encounter (Signed)
Pt has been scheduled for 11/19/22. Instructions mailed and prep sent to the pharmacy.

## 2022-11-14 ENCOUNTER — Encounter (HOSPITAL_COMMUNITY)
Admission: RE | Admit: 2022-11-14 | Discharge: 2022-11-14 | Disposition: A | Discharge status: Home / Self Care | Payer: Self-pay | Source: Ambulatory Visit | Attending: Internal Medicine | Admitting: Internal Medicine

## 2022-11-14 NOTE — Pre-Procedure Instructions (Signed)
Attempted pre-op phone call. Left VM for him to call us back. 

## 2022-11-19 ENCOUNTER — Encounter (HOSPITAL_COMMUNITY): Admission: RE | Payer: Self-pay | Source: Home / Self Care

## 2022-11-19 ENCOUNTER — Ambulatory Visit (HOSPITAL_COMMUNITY): Admission: RE | Admit: 2022-11-19 | Payer: Self-pay | Source: Home / Self Care | Admitting: Internal Medicine

## 2022-11-19 SURGERY — COLONOSCOPY WITH PROPOFOL
Anesthesia: Monitor Anesthesia Care

## 2022-11-23 ENCOUNTER — Telehealth: Payer: Self-pay | Admitting: *Deleted

## 2022-11-23 NOTE — Telephone Encounter (Signed)
Late entry:  Elsie Amis, RN  Elinor Dodge, LPN Good morning! I just spoke with Astrid Divine. He told me he is cancelling procedure for now due to "insurance issues". I just wanted you be aware. I will have Eber Jones put him in the depot for now.

## 2023-01-01 ENCOUNTER — Encounter: Payer: Self-pay | Admitting: Gastroenterology

## 2023-01-17 ENCOUNTER — Telehealth (HOSPITAL_COMMUNITY): Payer: Self-pay

## 2023-01-17 NOTE — Telephone Encounter (Signed)
Called and left patient a voice message to confirm/remind patient of their appointment [facility] on [facility].   In addition, if he could also bring in all medications and/or complete list.

## 2023-01-18 ENCOUNTER — Encounter (HOSPITAL_COMMUNITY): Payer: Medicaid Other

## 2023-03-12 ENCOUNTER — Other Ambulatory Visit (HOSPITAL_COMMUNITY): Payer: Self-pay | Admitting: Adult Health

## 2023-03-12 DIAGNOSIS — I38 Endocarditis, valve unspecified: Secondary | ICD-10-CM

## 2023-05-30 ENCOUNTER — Encounter (HOSPITAL_COMMUNITY): Payer: Self-pay | Admitting: Emergency Medicine

## 2023-05-30 ENCOUNTER — Emergency Department (HOSPITAL_COMMUNITY): Payer: Self-pay

## 2023-05-30 ENCOUNTER — Other Ambulatory Visit: Payer: Self-pay

## 2023-05-30 ENCOUNTER — Emergency Department (HOSPITAL_COMMUNITY)
Admission: EM | Admit: 2023-05-30 | Discharge: 2023-05-30 | Disposition: A | Discharge status: Home / Self Care | Payer: Self-pay

## 2023-05-30 DIAGNOSIS — E119 Type 2 diabetes mellitus without complications: Secondary | ICD-10-CM | POA: Insufficient documentation

## 2023-05-30 DIAGNOSIS — M79671 Pain in right foot: Secondary | ICD-10-CM | POA: Insufficient documentation

## 2023-05-30 LAB — CBG MONITORING, ED: Glucose-Capillary: 90 mg/dL (ref 70–99)

## 2023-05-30 MED ORDER — MELOXICAM 7.5 MG PO TABS: 7.5000 mg | ORAL_TABLET | Freq: Every day | ORAL | 0 refills | Status: AC

## 2023-05-30 NOTE — Discharge Instructions (Signed)
Your exam suggest that you have a tendon strain of your foot, you do not have appear to have any complications regarding your diabetes or circulation in your foot.  I do recommend the pain medication prescribed although use caution, if you start to develop any acid reflux or upper abdominal pain with this medication then discontinue this medicine.  This is the medicine that you have taken in the past.  Wear the postop shoe for comfort.  Apply ice and heat as we discussed and as needed to help heal your pain symptoms.  I have given you referral to podiatrist for follow-up care if your symptoms are not improving with this treatment plan.

## 2023-05-30 NOTE — ED Provider Notes (Signed)
[place] [facility] DEPARTMENT AT [facility] Provider Note   CSN: [id number] Arrival date & time: 05/30/23  0751     History  Chief Complaint  Patient presents with   Foot Pain    Right     Andrew Hinton is a 47 y.o. male presenting with right foot pain, described as sharp pain with flexion and extension of the toes of the right foot. No pain at rest.   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  This activity has been difficult to tolerate this week with this new pain.  He denies redness, swelling or radiation of pain.  He took ibuprofen last night without relief of symptoms.  Despite triage note, pt denied numbness as a sensation.    The history is provided by the patient.       Home Medications Prior to Admission medications   Medication Sig Start Date End Date Taking? Authorizing Provider  meloxicam (MOBIC) 7.5 MG tablet Take 1-2 tablets (7.5-15 mg total) by mouth daily for 10 days. 05/30/23 06/09/23 Yes Denisha Hoel, Raynelle Fanning, PA-C  acetaminophen (TYLENOL) 325 MG tablet Take 2 tablets (650 mg total) by mouth every 6 (six) hours as needed. 07/31/22   Sloan Leiter, DO  carvedilol (COREG) 12.5 MG tablet Take 1.5 tablets (18.75 mg total) by mouth 2 (two) times daily with a meal. 09/20/22   Laurey Morale, MD  dapagliflozin propanediol (FARXIGA) 10 MG TABS tablet TAKE 1 TABLET(10 MG) BY MOUTH DAILY BEFORE BREAKFAST 09/20/22   Laurey Morale, MD  dicyclomine (BENTYL) 10 MG capsule Take 1 capsule (10 mg total) by mouth 3 (three) times daily before meals. 10/17/22   Aida Raider, NP  digoxin (LANOXIN) 0.125 MG tablet Take 1 tablet (0.125 mg total) by mouth daily. NEEDS FOLLOW UP APPOINTMENT FOR MORE REFILLS 03/12/23   Tonye Becket D, NP  ENTRESTO 97-103 MG TAKE 1 TABLET BY MOUTH TWICE DAILY 11/09/21   Laurey Morale, MD  eplerenone (INSPRA) 50 MG tablet Take 1  tablet (50 mg total) by mouth daily. 05/02/21   Laurey Morale, MD  HYDROcodone-acetaminophen (NORCO/VICODIN) 5-325 MG tablet Take 1 tablet by mouth every 4 (four) hours as needed. Patient not taking: Reported on 10/17/2022 01/16/22   Burgess Amor, PA-C  methocarbamol (ROBAXIN) 500 MG tablet Take 500 mg by mouth every 8 (eight) hours as needed. 09/18/22   [provider]  ondansetron (ZOFRAN) 4 MG tablet Take 1 tablet (4 mg total) by mouth every 8 (eight) hours as needed for nausea or vomiting. 01/16/22   Burgess Amor, PA-C  pantoprazole (PROTONIX) 40 MG tablet Take 1 tablet (40 mg total) by mouth daily. 10/17/22   Aida Raider, NP  sertraline (ZOLOFT) 25 MG tablet Take 25 mg by mouth daily. 07/19/20   [provider]  terbinafine (LAMISIL) 250 MG tablet Take 250 mg by mouth daily. 10/05/22   [provider]  Vitamin D, Ergocalciferol, (DRISDOL) 1.25 MG (50000 UNIT) CAPS capsule Take 50,000 Units by mouth once a week. 03/30/21   [provider]      Allergies    Spironolactone    Review of Systems   Review of Systems  Constitutional:  Negative for fever.  Musculoskeletal:  Positive for arthralgias. Negative for joint swelling and myalgias.  Neurological:  Negative for weakness and numbness.  All other systems reviewed and are  negative.   Physical Exam Updated Vital Signs BP 130/88   Pulse 80   Temp 98.5 F (36.9 C)   Resp 17   Ht 6' (1.829 m)   Wt 90.7 kg   SpO2 99%   BMI 27.12 kg/m  Physical Exam Constitutional:      Appearance: He is well-developed.  HENT:     Head: Atraumatic.  Cardiovascular:     Comments: Pulses equal bilaterally Musculoskeletal:        General: Tenderness present.     Cervical back: Normal range of motion.     Right foot: Normal capillary refill. No swelling, deformity, foot drop, prominent metatarsal heads or bony tenderness. Normal pulse.     Comments: Patient has some mild tenderness to palpation along his dorsal MTP  joints of the right foot.  Pain is worsened with active and passive flexion and extension along the length of the tendons of his distal dorsal foot.  There is no erythema, edema or induration.  Skin is intact.  Less than 2-second cap refill in toes.  Skin:    General: Skin is warm and dry.  Neurological:     Mental Status: He is alert.     Sensory: No sensory deficit.     Motor: No weakness.     Deep Tendon Reflexes: Reflexes normal.     ED Results / Procedures / Treatments   Labs (all labs ordered are listed, but only abnormal results are displayed) Labs Reviewed  CBG MONITORING, ED    EKG None  Radiology DG Foot Complete Right  Result Date: 05/30/2023 CLINICAL DATA:  Toe pain.  No known injury.  History of diabetes. EXAM: RIGHT FOOT COMPLETE - 3+ VIEW COMPARISON:  None Available. FINDINGS: No acute fracture or dislocation. No aggressive osseous lesion. No significant arthritis. No focal soft tissue swelling. No radiopaque foreign bodies. IMPRESSION: *Negative. Electronically Signed   By: Jules Schick M.D.   On: 05/30/2023 08:53    Procedures Procedures    Medications Ordered in ED Medications - No data to display  ED Course/ Medical Decision Making/ A&P                                 Medical Decision Making Exam and history suggesting tendinitis of the right foot.  He has good pulses in the foot, distal sensation is intact, this is not a neurovascular source.  His CBG was checked and is normal range.  Imaging negative.  Patient was given a postop shoe for as needed use, we discussed role of ice and heat and advised that he may need to see a podiatrist if symptoms persist or worsen.  He was given referral for this.  Meloxicam prescribed, he states he has had this medication in the past for other orthopedic concerns and has been very effective for him.  Amount and/or Complexity of Data Reviewed Radiology: ordered and independent interpretation performed.    Details:  Normal foot x-ray.  Risk Prescription drug management.           Final Clinical Impression(s) / ED Diagnoses Final diagnoses:  Foot pain, right    Rx / DC Orders ED Discharge Orders          Ordered    meloxicam (MOBIC) 7.5 MG tablet  Daily        05/30/23 1042              Dantae Meunier,  Raynelle Fanning, PA-C 05/30/23 1734    Coral Spikes, DO 06/07/23 1513

## 2023-05-30 NOTE — ED Triage Notes (Signed)
Pt presents with right toe numbness and tingling 3rd -5th digits, denies injury, history of diabetes, per pt toes pink in color.

## 2023-05-30 NOTE — ED Notes (Signed)
Patient ambulatory to restroom at this time. 

## 2023-08-08 ENCOUNTER — Encounter (HOSPITAL_COMMUNITY): Payer: Self-pay

## 2023-08-08 ENCOUNTER — Emergency Department (HOSPITAL_COMMUNITY): Payer: Self-pay

## 2023-08-08 ENCOUNTER — Emergency Department (HOSPITAL_COMMUNITY)
Admission: EM | Admit: 2023-08-08 | Discharge: 2023-08-08 | Disposition: A | Discharge status: Home / Self Care | Payer: Self-pay | Attending: Emergency Medicine | Admitting: Emergency Medicine

## 2023-08-08 ENCOUNTER — Other Ambulatory Visit: Payer: Self-pay

## 2023-08-08 DIAGNOSIS — Z20822 Contact with and (suspected) exposure to covid-19: Secondary | ICD-10-CM | POA: Insufficient documentation

## 2023-08-08 DIAGNOSIS — F17200 Nicotine dependence, unspecified, uncomplicated: Secondary | ICD-10-CM | POA: Insufficient documentation

## 2023-08-08 DIAGNOSIS — R072 Precordial pain: Secondary | ICD-10-CM | POA: Insufficient documentation

## 2023-08-08 DIAGNOSIS — R058 Other specified cough: Secondary | ICD-10-CM

## 2023-08-08 DIAGNOSIS — J069 Acute upper respiratory infection, unspecified: Secondary | ICD-10-CM | POA: Insufficient documentation

## 2023-08-08 DIAGNOSIS — J339 Nasal polyp, unspecified: Secondary | ICD-10-CM | POA: Insufficient documentation

## 2023-08-08 DIAGNOSIS — R040 Epistaxis: Secondary | ICD-10-CM | POA: Insufficient documentation

## 2023-08-08 LAB — CBC
HCT: 44.4 % (ref 39.0–52.0)
Hemoglobin: 14.8 g/dL (ref 13.0–17.0)
MCH: 31 pg (ref 26.0–34.0)
MCHC: 33.3 g/dL (ref 30.0–36.0)
MCV: 92.9 fL (ref 80.0–100.0)
Platelets: 334 10*3/uL (ref 150–400)
RBC: 4.78 MIL/uL (ref 4.22–5.81)
RDW: 12.3 % (ref 11.5–15.5)
WBC: 7.9 10*3/uL (ref 4.0–10.5)
nRBC: 0 % (ref 0.0–0.2)

## 2023-08-08 LAB — COMPREHENSIVE METABOLIC PANEL
ALT: 26 U/L (ref 0–44)
AST: 23 U/L (ref 15–41)
Albumin: 4.3 g/dL (ref 3.5–5.0)
Alkaline Phosphatase: 50 U/L (ref 38–126)
Anion gap: 9 (ref 5–15)
BUN: 23 mg/dL — ABNORMAL HIGH (ref 6–20)
CO2: 26 mmol/L (ref 22–32)
Calcium: 9.2 mg/dL (ref 8.9–10.3)
Chloride: 102 mmol/L (ref 98–111)
Creatinine, Ser: 0.92 mg/dL (ref 0.61–1.24)
GFR, Estimated: >60 mL/min (ref >60)
Glucose, Bld: 107 mg/dL — ABNORMAL HIGH (ref 70–99)
Potassium: 4.1 mmol/L (ref 3.5–5.1)
Sodium: 137 mmol/L (ref 135–145)
Total Bilirubin: 0.6 mg/dL (ref <1.2)
Total Protein: 7.6 g/dL (ref 6.5–8.1)

## 2023-08-08 LAB — RESP PANEL BY RT-PCR (RSV, FLU A&B, COVID)  RVPGX2
Influenza A by PCR: NEGATIVE
Influenza B by PCR: NEGATIVE
Resp Syncytial Virus by PCR: NEGATIVE
SARS Coronavirus 2 by RT PCR: NEGATIVE

## 2023-08-08 LAB — TROPONIN I (HIGH SENSITIVITY): Troponin I (High Sensitivity): 7 ng/L (ref <18)

## 2023-08-08 MED ORDER — GUAIFENESIN-DM 100-10 MG/5ML PO SYRP
10.0000 mL | ORAL_SOLUTION | Freq: Once | ORAL | Status: AC
  Administered 2023-08-08: 10 mL via ORAL
  Filled 2023-08-08: qty 10

## 2023-08-08 NOTE — ED Triage Notes (Signed)
Pt has been on meds for diabetes, HTN, and heart meds but states his medicaid was cancelled on him around 6 months so he has not has any of his regular meds.

## 2023-08-08 NOTE — ED Triage Notes (Signed)
Pt woke up this am with blood on pillow and c/o chest pain with deep inhalation

## 2023-08-08 NOTE — Discharge Instructions (Signed)
It was our pleasure to provide your ER care today - we hope that you feel better.  For nose bleeding and nasal polyp, follow up with ENT doctor in the next couple weeks.   Drink plenty of fluids/stay well hydrated.  You may try myquil, mucinex, robitussin or similar over the counter cold and flu medication for symptom relief.   Follow up with primary care doctor in two weeks if symptoms fail to improve/resolve.  Return to ER if worse, new symptoms, increased trouble breathing, recurrent or persistent chest pain, persistent bleeding, high fevers, or other emergency concern.

## 2023-08-08 NOTE — ED Notes (Signed)
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

## 2023-08-08 NOTE — ED Provider Notes (Signed)
[place] EMERGENCY DEPARTMENT AT [facility] Provider Note   CSN: [id number] Arrival date & time: 08/08/23  4010     History  Chief Complaint  Patient presents with   Epistaxis   Cough   Chest Pain    Andrew Hinton is a 47 y.o. male.  Pt with c/o noticing small amount blood on pillow this AM, and when rubbed nose small amount of blood in nose. Also c/o 'hacking' cough, nasal congestion. No sore throat. No trouble breathing or swallowing. +smoker. No specific known ill contacts. Chest tightness in past day, at rest, not pleuritic. No exertional cp or discomfort. No associated nv or diaphoresis. No leg pain or swelling. No recent surgery, trauma, travel or immobility. No hx frequent nosebleeding. No other abnormal bruising or bleeding. No anticoagulant use.   The history is provided by the patient and medical records.  Epistaxis Associated symptoms: congestion and cough   Associated symptoms: no fever, no headaches and no sore throat        Home Medications Prior to Admission medications   Medication Sig Start Date End Date Taking? Authorizing Provider  acetaminophen (TYLENOL) 325 MG tablet Take 2 tablets (650 mg total) by mouth every 6 (six) hours as needed. 07/31/22   Sloan Leiter, DO  carvedilol (COREG) 12.5 MG tablet Take 1.5 tablets (18.75 mg total) by mouth 2 (two) times daily with a meal. 09/20/22   Laurey Morale, MD  dapagliflozin propanediol (FARXIGA) 10 MG TABS tablet TAKE 1 TABLET(10 MG) BY MOUTH DAILY BEFORE BREAKFAST 09/20/22   Laurey Morale, MD  dicyclomine (BENTYL) 10 MG capsule Take 1 capsule (10 mg total) by mouth 3 (three) times daily before meals. 10/17/22   Aida Raider, NP  digoxin (LANOXIN) 0.125 MG tablet Take 1 tablet (0.125 mg total) by mouth daily. NEEDS FOLLOW UP APPOINTMENT FOR MORE REFILLS 03/12/23   Tonye Becket D, NP  ENTRESTO 97-103 MG TAKE 1 TABLET BY MOUTH TWICE DAILY 11/09/21   Laurey Morale, MD  eplerenone (INSPRA) 50 MG  tablet Take 1 tablet (50 mg total) by mouth daily. 05/02/21   Laurey Morale, MD  HYDROcodone-acetaminophen (NORCO/VICODIN) 5-325 MG tablet Take 1 tablet by mouth every 4 (four) hours as needed. Patient not taking: Reported on 10/17/2022 01/16/22   Burgess Amor, PA-C  methocarbamol (ROBAXIN) 500 MG tablet Take 500 mg by mouth every 8 (eight) hours as needed. 09/18/22   [provider]  ondansetron (ZOFRAN) 4 MG tablet Take 1 tablet (4 mg total) by mouth every 8 (eight) hours as needed for nausea or vomiting. 01/16/22   Burgess Amor, PA-C  pantoprazole (PROTONIX) 40 MG tablet Take 1 tablet (40 mg total) by mouth daily. 10/17/22   Aida Raider, NP  sertraline (ZOLOFT) 25 MG tablet Take 25 mg by mouth daily. 07/19/20   [provider]  terbinafine (LAMISIL) 250 MG tablet Take 250 mg by mouth daily. 10/05/22   [provider]  Vitamin D, Ergocalciferol, (DRISDOL) 1.25 MG (50000 UNIT) CAPS capsule Take 50,000 Units by mouth once a week. 03/30/21   [provider]      Allergies    Spironolactone    Review of Systems   Review of Systems  Constitutional:  Negative for chills and fever.  HENT:  Positive for congestion, nosebleeds and rhinorrhea. Negative for sore throat.   Eyes:  Negative for redness.  Respiratory:  Positive for cough. Negative for shortness of breath.   Cardiovascular:  Negative  for leg swelling.  Gastrointestinal:  Negative for abdominal pain, blood in stool, nausea and vomiting.  Genitourinary:  Negative for flank pain and hematuria.  Musculoskeletal:  Negative for back pain and neck pain.  Skin:  Negative for rash.  Neurological:  Negative for headaches.    Physical Exam Updated Vital Signs BP 122/89   Pulse 66   Temp 98.1 F (36.7 C) (Oral)   Resp 13   Ht 1.829 m (6')   Wt 92.1 kg   SpO2 98%   BMI 27.53 kg/m  Physical Exam Vitals and nursing note reviewed.  Constitutional:      Appearance: Normal appearance. He is well-developed.   HENT:     Head: Atraumatic.     Nose: Nose normal.     Comments: Polyp right naris. No active bleeding anteriorly or posteriorly.     Mouth/Throat:     Mouth: Mucous membranes are moist.     Pharynx: Oropharynx is clear.  Eyes:     General: No scleral icterus.    Conjunctiva/sclera: Conjunctivae normal.  Neck:     Trachea: No tracheal deviation.  Cardiovascular:     Rate and Rhythm: Normal rate and regular rhythm.     Pulses: Normal pulses.     Heart sounds: Normal heart sounds. No murmur heard.    No friction rub. No gallop.  Pulmonary:     Effort: Pulmonary effort is normal. No accessory muscle usage or respiratory distress.     Breath sounds: Normal breath sounds.  Abdominal:     General: There is no distension.     Palpations: Abdomen is soft.     Tenderness: There is no abdominal tenderness.  Musculoskeletal:        General: No swelling or tenderness.     Cervical back: Normal range of motion and neck supple. No rigidity.     Right lower leg: No edema.     Left lower leg: No edema.  Skin:    General: Skin is warm and dry.     Findings: No rash.  Neurological:     Mental Status: He is alert.     Comments: Alert, speech clear.   Psychiatric:        Mood and Affect: Mood normal.     ED Results / Procedures / Treatments   Labs (all labs ordered are listed, but only abnormal results are displayed) Results for orders placed or performed during the hospital encounter of 08/08/23  Resp panel by RT-PCR (RSV, Flu A&B, Covid) Anterior Nasal Swab   Collection Time: 08/08/23  9:07 AM   Specimen: Anterior Nasal Swab  Result Value Ref Range   SARS Coronavirus 2 by RT PCR NEGATIVE NEGATIVE   Influenza A by PCR NEGATIVE NEGATIVE   Influenza B by PCR NEGATIVE NEGATIVE   Resp Syncytial Virus by PCR NEGATIVE NEGATIVE  CBC   Collection Time: 08/08/23  9:14 AM  Result Value Ref Range   WBC 7.9 4.0 - 10.5 K/uL   RBC 4.78 4.22 - 5.81 MIL/uL   Hemoglobin 14.8 13.0 - 17.0 g/dL    HCT 16.1 09.6 - 04.5 %   MCV 92.9 80.0 - 100.0 fL   MCH 31.0 26.0 - 34.0 pg   MCHC 33.3 30.0 - 36.0 g/dL   RDW 40.9 81.1 - 91.4 %   Platelets 334 150 - 400 K/uL   nRBC 0.0 0.0 - 0.2 %  Comprehensive metabolic panel   Collection Time: 08/08/23  9:14 AM  Result  Value Ref Range   Sodium 137 135 - 145 mmol/L   Potassium 4.1 3.5 - 5.1 mmol/L   Chloride 102 98 - 111 mmol/L   CO2 26 22 - 32 mmol/L   Glucose, Bld 107 (H) 70 - 99 mg/dL   BUN 23 (H) 6 - 20 mg/dL   Creatinine, Ser 2.72 0.61 - 1.24 mg/dL   Calcium 9.2 8.9 - 53.6 mg/dL   Total Protein 7.6 6.5 - 8.1 g/dL   Albumin 4.3 3.5 - 5.0 g/dL   AST 23 15 - 41 U/L   ALT 26 0 - 44 U/L   Alkaline Phosphatase 50 38 - 126 U/L   Total Bilirubin 0.6 <1.2 mg/dL   GFR, Estimated >64 >40 mL/min   Anion gap 9 5 - 15  Troponin I (High Sensitivity)   Collection Time: 08/08/23  9:14 AM  Result Value Ref Range   Troponin I (High Sensitivity) 7 <18 ng/L      EKG EKG Interpretation Date/Time:  Thursday August 08 2023 09:23:17 EST Ventricular Rate:  65 PR Interval:  133 QRS Duration:  85 QT Interval:  444 QTC Calculation: 462 R Axis:   16  Text Interpretation: Sinus rhythm Probable LVH with secondary repol abnrm Non-specific ST-t changes `ecg similar to June 2023 ecg Confirmed by Cathren Laine ([id number]) on 08/08/2023 9:26:05 AM  Radiology DG Chest Port 1 View Result Date: 08/08/2023 CLINICAL DATA:  Cough EXAM: PORTABLE CHEST 1 VIEW COMPARISON:  07/31/2022 FINDINGS: Single lead left-sided implanted cardiac device remains in place. The heart size and mediastinal contours are within normal limits. No focal airspace consolidation, pleural effusion, or pneumothorax. The visualized skeletal structures are unremarkable. IMPRESSION: No active disease. Electronically Signed   By: Duanne Guess D.O.   On: 08/08/2023 09:18    Procedures Procedures    Medications Ordered in ED Medications - No data to display  ED Course/ Medical Decision  Making/ A&P                                 Medical Decision Making Problems Addressed: Anterior epistaxis: acute illness or injury Nasal polyp: chronic illness or injury Non-productive cough: acute illness or injury with systemic symptoms that poses a threat to life or bodily functions Precordial chest pain: acute illness or injury with systemic symptoms that poses a threat to life or bodily functions Viral URI with cough: acute illness or injury with systemic symptoms that poses a threat to life or bodily functions  Amount and/or Complexity of Data Reviewed External Data Reviewed: notes. Labs: ordered. Decision-making details documented in ED Course. Radiology: ordered and independent interpretation performed. Decision-making details documented in ED Course. ECG/medicine tests: ordered and independent interpretation performed. Decision-making details documented in ED Course.  Risk OTC drugs. Decision regarding hospitalization.   Iv ns. Continuous pulse ox and cardiac monitoring. Labs ordered/sent. Imaging ordered.   Differential diagnosis includes acs, msk cp, gi cp, epistaxis, etc. Dispo decision including potential need for admission considered - will get labs and imaging and reassess.   Reviewed nursing notes and prior charts for additional history. External reports reviewed. Cardiac cath 2021 without significant cad.   Cardiac monitor: sinus rhythm, rate 66.  Labs reviewed/interpreted by me - wbc and hgb normal. Chem unremarkable. Trop normal - after persistent symptoms > 24 hrs, felt not c/w acs.  Covid and flu neg.   Xrays reviewed/interpreted by me - no pna.  Recheck, no  bleeding. Breathing comfortably, o2 sats 98-99%.    Pt currently appears stable for d/c.   Rec close pcp f/u.  Return precautions provided.          Final Clinical Impression(s) / ED Diagnoses Final diagnoses:  Anterior epistaxis  Non-productive cough  Precordial chest pain  Viral URI  with cough  Nasal polyp    Rx / DC Orders ED Discharge Orders     None         Cathren Laine, MD 08/08/23 1027

## 2023-12-27 ENCOUNTER — Encounter (HOSPITAL_COMMUNITY): Payer: Self-pay | Admitting: *Deleted

## 2023-12-27 ENCOUNTER — Emergency Department (HOSPITAL_COMMUNITY)
Admission: EM | Admit: 2023-12-27 | Discharge: 2023-12-27 | Disposition: A | Discharge status: Home / Self Care | Payer: Self-pay | Attending: Emergency Medicine | Admitting: Emergency Medicine

## 2023-12-27 ENCOUNTER — Other Ambulatory Visit: Payer: Self-pay

## 2023-12-27 DIAGNOSIS — Z87891 Personal history of nicotine dependence: Secondary | ICD-10-CM | POA: Insufficient documentation

## 2023-12-27 DIAGNOSIS — J343 Hypertrophy of nasal turbinates: Secondary | ICD-10-CM | POA: Insufficient documentation

## 2023-12-27 DIAGNOSIS — Z79899 Other long term (current) drug therapy: Secondary | ICD-10-CM | POA: Insufficient documentation

## 2023-12-27 DIAGNOSIS — I5042 Chronic combined systolic (congestive) and diastolic (congestive) heart failure: Secondary | ICD-10-CM | POA: Insufficient documentation

## 2023-12-27 DIAGNOSIS — R040 Epistaxis: Secondary | ICD-10-CM | POA: Insufficient documentation

## 2023-12-27 DIAGNOSIS — J45909 Unspecified asthma, uncomplicated: Secondary | ICD-10-CM | POA: Insufficient documentation

## 2023-12-27 DIAGNOSIS — Z8616 Personal history of COVID-19: Secondary | ICD-10-CM | POA: Insufficient documentation

## 2023-12-27 DIAGNOSIS — Z95810 Presence of automatic (implantable) cardiac defibrillator: Secondary | ICD-10-CM | POA: Insufficient documentation

## 2023-12-27 HISTORY — DX: Presence of automatic (implantable) cardiac defibrillator: Z95.810

## 2023-12-27 HISTORY — DX: Heart disease, unspecified: I51.9

## 2023-12-27 MED ORDER — OXYMETAZOLINE HCL 0.05 % NA SOLN
2.0000 | Freq: Once | NASAL | Status: AC
Start: 2023-12-27 — End: 2023-12-27
  Administered 2023-12-27: 2 via NASAL
  Filled 2023-12-27: qty 30

## 2023-12-27 MED ORDER — ACETAMINOPHEN 500 MG PO TABS
1000.0000 mg | ORAL_TABLET | Freq: Once | ORAL | Status: AC
  Administered 2023-12-27: 1000 mg via ORAL
  Filled 2023-12-27: qty 2

## 2023-12-27 NOTE — Discharge Instructions (Addendum)
 It was a pleasure caring for you today in the emergency department.  Please follow-up with a ENT regarding nose abnormality, enlarged turbinate  Please use humidifier in your bedroom at nighttime.  You may apply a small amount of Vaseline or Neosporin to the inside edge of your nose to help keep it moisturized.  If nosebleed does occur apply firm pressure to the soft part of your nose as instructed in the ER and lean head forward.  Please return to the emergency department for any worsening or worrisome symptoms.

## 2023-12-27 NOTE — ED Notes (Signed)
 ED Provider at bedside.

## 2023-12-27 NOTE — ED Provider Notes (Signed)
 [place] EMERGENCY DEPARTMENT AT [facility] Provider Note  CSN: [id number] Arrival date & time: 12/27/23 4098  Chief Complaint(s) Epistaxis  HPI Norberto Proffitt is a 48 y.o. male with past medical history as below, significant for anxiety, depression, CHF who presents to the ED with complaint of epistaxis.  Epistaxis this am, feels was coming from b/l nares. Did swallow some blood and felt nauseated but his has resolved. He denies and nasal or facial trauma preceding the epistaxis.  No difficulty breathing or swallowing.  No vomiting.  No anticoagulant use denies history of recurrent nosebleeds.  Pt reports difficulty breathing through his nose >12 mos, anosmia >12 mos. Has not seen ENT or pcp regarding this   Past Medical History Past Medical History:  Diagnosis Date   AICD (automatic cardioverter/defibrillator) present    AICD placed after sycopal episode and told only 20% of his heart was working.   Anxiety    Asthma    Depression    Diabetes (HCC)    Heart disease    Nerve damage    Patient Active Problem List   Diagnosis Date Noted   COVID-19 virus infection 08/27/2020   Acute diarrhea 08/27/2020   Dehydration 08/27/2020   Hepatic steatosis 08/27/2020   Umbilical hernia 08/27/2020   Left inguinal hernia 08/27/2020   Gastroenteritis due to COVID-19 virus 08/27/2020   Chronic combined systolic and diastolic heart failure (HCC) 08/27/2020   Hypotension 08/27/2020   ICD (implantable cardioverter-defibrillator) in place 07/05/2020   Chronic systolic heart failure (HCC) 03/16/2020   Syncope 10/09/2019   AKI (acute kidney injury) (HCC) 10/09/2019   Acute systolic CHF (congestive heart failure) (HCC) 10/09/2019   Subarachnoid hemorrhage (HCC) 10/08/2019   Adjustment disorder with depressed mood 02/16/2015   Acute pain of left knee 02/01/2015   Allergic rhinitis 01/17/2015   Home Medication(s) Prior to Admission medications   Medication Sig Start Date End  Date Taking? Authorizing Provider  acetaminophen  (TYLENOL ) 325 MG tablet Take 2 tablets (650 mg total) by mouth every 6 (six) hours as needed. 07/31/22   Teddi Favors, DO  carvedilol  (COREG ) 12.5 MG tablet Take 1.5 tablets (18.75 mg total) by mouth 2 (two) times daily with a meal. 09/20/22   Darlis Eisenmenger, MD  dapagliflozin  propanediol (FARXIGA ) 10 MG TABS tablet TAKE 1 TABLET(10 MG) BY MOUTH DAILY BEFORE BREAKFAST 09/20/22   Darlis Eisenmenger, MD  dicyclomine  (BENTYL ) 10 MG capsule Take 1 capsule (10 mg total) by mouth 3 (three) times daily before meals. 10/17/22   Eustacio Highman, NP  digoxin  (LANOXIN ) 0.125 MG tablet Take 1 tablet (0.125 mg total) by mouth daily. NEEDS FOLLOW UP APPOINTMENT FOR MORE REFILLS 03/12/23   Nieves Bars D, NP  ENTRESTO  97-103 MG TAKE 1 TABLET BY MOUTH TWICE DAILY 11/09/21   Darlis Eisenmenger, MD  eplerenone  (INSPRA ) 50 MG tablet Take 1 tablet (50 mg total) by mouth daily. 05/02/21   Darlis Eisenmenger, MD  HYDROcodone -acetaminophen  (NORCO/VICODIN) 5-325 MG tablet Take 1 tablet by mouth every 4 (four) hours as needed. Patient not taking: Reported on 10/17/2022 01/16/22   Idol, Julie, PA-C  methocarbamol  (ROBAXIN ) 500 MG tablet Take 500 mg by mouth every 8 (eight) hours as needed. 09/18/22   [provider]  ondansetron  (ZOFRAN ) 4 MG tablet Take 1 tablet (4 mg total) by mouth every 8 (eight) hours as needed for nausea or vomiting. 01/16/22   Idol, Julie, PA-C  pantoprazole  (PROTONIX ) 40 MG tablet Take 1 tablet (40  mg total) by mouth daily. 10/17/22   Eustacio Highman, NP  sertraline (ZOLOFT) 25 MG tablet Take 25 mg by mouth daily. 07/19/20   [provider]  terbinafine (LAMISIL) 250 MG tablet Take 250 mg by mouth daily. 10/05/22   [provider]  Vitamin D, Ergocalciferol, (DRISDOL) 1.25 MG (50000 UNIT) CAPS capsule Take 50,000 Units by mouth once a week. 03/30/21   [provider]                                                                                                                                     Past Surgical History Past Surgical History:  Procedure Laterality Date   APPENDECTOMY     ICD IMPLANT N/A 03/22/2020   Procedure: ICD IMPLANT;  Surgeon: Tammie Fall, MD;  Location: [facility] INVASIVE CV LAB;  Service: Cardiovascular;  Laterality: N/A;   RIGHT/LEFT HEART CATH AND CORONARY ANGIOGRAPHY N/A 10/12/2019   Procedure: RIGHT/LEFT HEART CATH AND CORONARY ANGIOGRAPHY;  Surgeon: Darlis Eisenmenger, MD;  Location: [facility] INVASIVE CV LAB;  Service: Cardiovascular;  Laterality: N/A;   Family History Family History  Problem Relation Age of Onset   Diabetes Mother    Lung cancer Father 68 - 46   Prostate cancer Maternal Grandfather    Diabetes Other        multple uncles   Colon cancer Neg Hx    Colon polyps Neg Hx     Social History Social History   Tobacco Use   Smoking status: Former    Current packs/day: 0.00    Types: Cigarettes    Quit date: 10/07/2017    Years since quitting: 6.2   Smokeless tobacco: Never  Vaping Use   Vaping status: Never Used  Substance Use Topics   Alcohol use: Yes    Alcohol/week: 42.0 standard drinks of alcohol    Types: 42 Cans of beer per week    Comment: 6 back of beer daily   Drug use: No   Allergies Spironolactone   Review of Systems A thorough review of systems was obtained and all systems are negative except as noted in the HPI and PMH.   Physical Exam Vital Signs  I have reviewed the triage vital signs BP (!) 135/98 (BP Location: Right Arm)   Pulse 79   Temp 97.8 F (36.6 C) (Oral)   Resp 20   Ht 6' (1.829 m)   Wt 90.7 kg   SpO2 98%   BMI 27.12 kg/m  Physical Exam Vitals and nursing note reviewed.  Constitutional:      General: He is not in acute distress.    Appearance: Normal appearance. He is well-developed. He is not ill-appearing.  HENT:     Head: Normocephalic and atraumatic.     Right Ear: External ear normal.     Left Ear: External ear normal.     Nose:  Rhinorrhea present. Rhinorrhea is clear.  Right Turbinates: Not enlarged.     Left Turbinates: Enlarged.     Mouth/Throat:     Mouth: Mucous membranes are moist.     Pharynx: Uvula midline.     Comments: Poor dentition Eyes:     General: No scleral icterus.       Right eye: No discharge.        Left eye: No discharge.  Cardiovascular:     Rate and Rhythm: Normal rate.  Pulmonary:     Effort: Pulmonary effort is normal. No respiratory distress.     Breath sounds: No stridor.  Abdominal:     General: Abdomen is flat. There is no distension.     Tenderness: There is no guarding.  Musculoskeletal:        General: No deformity.     Cervical back: No rigidity.  Skin:    General: Skin is warm and dry.     Coloration: Skin is not cyanotic, jaundiced or pale.  Neurological:     Mental Status: He is alert and oriented to person, place, and time.     GCS: GCS eye subscore is 4. GCS verbal subscore is 5. GCS motor subscore is 6.  Psychiatric:        Speech: Speech normal.        Behavior: Behavior normal. Behavior is cooperative.     ED Results and Treatments Labs (all labs ordered are listed, but only abnormal results are displayed) Labs Reviewed - No data to display                                                                                                                        Radiology No results found.  Pertinent labs & imaging results that were available during my care of the patient were reviewed by me and considered in my medical decision making (see MDM for details).  Medications Ordered in ED Medications  oxymetazoline (AFRIN) 0.05 % nasal spray 2 spray (2 sprays Each Nare Given 12/27/23 1105)  acetaminophen  (TYLENOL ) tablet 1,000 mg (1,000 mg Oral Given 12/27/23 1216)                                                                                                                                     Procedures Procedures  (including critical care  time)  Medical Decision Making / ED Course    Medical Decision Making:  Lane Callais is a 48 y.o. male with past medical history as below, significant for anxiety, depression, CHF who presents to the ED with complaint of epistaxis. . The complaint involves an extensive differential diagnosis and also carries with it a high risk of complications and morbidity.  Serious etiology was considered. Ddx includes but is not limited to: Anterior versus posterior epistaxis, foreign body, septal hematoma, trauma, etc.  Complete initial physical exam performed, notably the patient was in no distress.    Reviewed and confirmed nursing documentation for past medical history, family history, social history.  Vital signs reviewed.     Brief summary:  48 year old male here with epistaxis.  Epistaxis has resolved, was given oxymetazoline.  No ongoing nausea.  He does have enlarged nasal turbinate on the left.  Patient reports difficulty breathing through his nose for over a year.  Recommend follow-up with ENT. Nosebleed precautions.  Patient in no distress and overall condition improved here in the ED. Detailed discussions were had with the patient/guardian regarding current findings, and need for close f/u with PCP or on call doctor. The patient/guardian has been instructed to return immediately if the symptoms worsen in any way for re-evaluation. Patient/guardian verbalized understanding and is in agreement with current care plan. All questions answered prior to discharge.                     Additional history obtained: -Additional history obtained from na -External records from outside source obtained and reviewed including: Chart review including previous notes, labs, imaging, consultation notes including  Prior ed eval   Lab Tests: na  EKG   EKG Interpretation Date/Time:    Ventricular Rate:    PR Interval:    QRS Duration:    QT Interval:    QTC Calculation:   R  Axis:      Text Interpretation:           Imaging Studies ordered: na   Medicines ordered and prescription drug management: Meds ordered this encounter  Medications   oxymetazoline (AFRIN) 0.05 % nasal spray 2 spray   acetaminophen  (TYLENOL ) tablet 1,000 mg    -I have reviewed the patients home medicines and have made adjustments as needed   Consultations Obtained: na   Cardiac Monitoring: Continuous pulse oximetry interpreted by myself, 100% on ra.    Social Determinants of Health:  Diagnosis or treatment significantly limited by social determinants of health: current smoker      Reevaluation: After the interventions noted above, I reevaluated the patient and found that they have resolved  Co morbidities that complicate the patient evaluation  Past Medical History:  Diagnosis Date   AICD (automatic cardioverter/defibrillator) present    AICD placed after sycopal episode and told only 20% of his heart was working.   Anxiety    Asthma    Depression    Diabetes (HCC)    Heart disease    Nerve damage       Dispostion: Disposition decision including need for hospitalization was considered, and patient discharged from emergency department.    Final Clinical Impression(s) / ED Diagnoses Final diagnoses:  Epistaxis  Nasal turbinate hypertrophy        Teddi Favors, DO 12/27/23 1221

## 2023-12-27 NOTE — ED Triage Notes (Addendum)
 Pt reports when he woke up he had a large amount of blood coming out of his nose, coughing up blood, and a bad headache. Pt denies use of blood thinners. No blood seen in triage, but pt does report he still has some draining from his nose at times.

## 2024-08-12 ENCOUNTER — Emergency Department (HOSPITAL_COMMUNITY)
Admission: EM | Admit: 2024-08-12 | Discharge: 2024-08-12 | Disposition: A | Discharge status: Home / Self Care | Payer: Self-pay | Attending: Emergency Medicine | Admitting: Emergency Medicine

## 2024-08-12 ENCOUNTER — Other Ambulatory Visit: Payer: Self-pay

## 2024-08-12 DIAGNOSIS — I509 Heart failure, unspecified: Secondary | ICD-10-CM | POA: Insufficient documentation

## 2024-08-12 DIAGNOSIS — M5416 Radiculopathy, lumbar region: Secondary | ICD-10-CM | POA: Insufficient documentation

## 2024-08-12 MED ORDER — LIDOCAINE 5 % EX PTCH
1.0000 | MEDICATED_PATCH | CUTANEOUS | Status: DC
  Administered 2024-08-12: 1 via TRANSDERMAL
  Filled 2024-08-12: qty 1

## 2024-08-12 MED ORDER — PREDNISONE 20 MG PO TABS: 40.0000 mg | ORAL_TABLET | Freq: Once | ORAL | Status: DC

## 2024-08-12 MED ORDER — PREDNISONE 50 MG PO TABS: 50.0000 mg | ORAL_TABLET | Freq: Every day | ORAL | 0 refills | Status: AC

## 2024-08-12 MED ORDER — OXYCODONE HCL 5 MG PO TABS: 5.0000 mg | ORAL_TABLET | Freq: Four times a day (QID) | ORAL | 0 refills | Status: AC | PRN

## 2024-08-12 MED ORDER — ACETAMINOPHEN 325 MG PO TABS: 650.0000 mg | ORAL_TABLET | Freq: Once | ORAL | Status: DC

## 2024-08-12 MED ORDER — KETOROLAC TROMETHAMINE 15 MG/ML IJ SOLN
15.0000 mg | Freq: Once | INTRAMUSCULAR | Status: AC
  Administered 2024-08-12: 15 mg via INTRAMUSCULAR
  Filled 2024-08-12: qty 1

## 2024-08-12 NOTE — Discharge Instructions (Signed)
 It was a pleasure taking care of you today.  You were evaluated in the ER for left-sided low back pain radiating down your leg.  This is likely due to a pinched nerve.  We are prescribing steroids and pain medicines.  Avoid heavy lifting, you can take over-the-counter Tylenol  instructed on packaging, he can also use over-the-counter lidocaine  patches.  Follow-up closely with your primary care doctor, they may refer you to physical therapy.  Come back to the ER for new or worsening symptoms, especially fever, weakness, inability to walk, loss of control of your bowels or bladder.  You were prescribed opiate pain medication. This can have many sided effect such as drowsiness, slowed breathing, and conspitation. Do not take it with other medications that cause drowsiness, or drink alcohol while taking it. Take a stool softener over the counter while taking it.  Do not drive or operate machinery when taking this medication.

## 2024-08-12 NOTE — ED Provider Notes (Signed)
 "  EMERGENCY DEPARTMENT AT [facility] Provider Note   CSN: [id number] Arrival date & time: 08/12/24  9158     Patient presents with: Back Pain and Leg Pain   Andrew Hinton is a 48 y.o. male.  History of CHF and has AICD.  Presents the ER today complaining of left lumbar pain that started about 2 days ago and was much more severe today and radiates down the left leg anterolaterally to the foot.  He has injury or trauma, no saddle anesthesia or paresthesia, no bowel or bladder incontinence he denies fevers or chills.  No unintentional weight loss.  Is not diabetic, denies IVDA, is not on anticoagulants.  Will ambulate but states is very painful.  He did drive himself here today.  Denies meds at home.    Back Pain Associated symptoms: leg pain   Leg Pain Associated symptoms: back pain        Prior to Admission medications  Medication Sig Start Date End Date Taking? Authorizing Provider  acetaminophen  (TYLENOL ) 325 MG tablet Take 2 tablets (650 mg total) by mouth every 6 (six) hours as needed. 07/31/22  Yes Elnor Jayson LABOR, DO  carvedilol  (COREG ) 12.5 MG tablet Take 1.5 tablets (18.75 mg total) by mouth 2 (two) times daily with a meal. 09/20/22  Yes Rolan Ezra RAMAN, MD  dapagliflozin  propanediol (FARXIGA ) 10 MG TABS tablet TAKE 1 TABLET(10 MG) BY MOUTH DAILY BEFORE BREAKFAST 09/20/22  Yes Rolan Ezra RAMAN, MD  dicyclomine  (BENTYL ) 10 MG capsule Take 1 capsule (10 mg total) by mouth 3 (three) times daily before meals. 10/17/22  Yes Mahon, Charmaine CROME, NP  digoxin  (LANOXIN ) 0.125 MG tablet Take 1 tablet (0.125 mg total) by mouth daily. NEEDS FOLLOW UP APPOINTMENT FOR MORE REFILLS 03/12/23  Yes Clegg, Amy D, NP  ENTRESTO  97-103 MG TAKE 1 TABLET BY MOUTH TWICE DAILY 11/09/21  Yes McLean, Dalton S, MD  ibuprofen (ADVIL) 600 MG tablet Take 600 mg by mouth every 6 (six) hours as needed for moderate pain (pain score 4-6). 03/31/16  Yes [provider]    Allergies:  Spironolactone     Review of Systems  Musculoskeletal:  Positive for back pain.    Updated Vital Signs BP (!) 131/90   Pulse 98   Temp 97.9 F (36.6 C) (Oral)   Resp 17   Ht 6' (1.829 m)   Wt 90.7 kg   SpO2 97%   BMI 27.12 kg/m   Physical Exam Vitals and nursing note reviewed.  Constitutional:      General: He is not in acute distress.    Appearance: He is well-developed.  HENT:     Head: Normocephalic and atraumatic.     Mouth/Throat:     Mouth: Mucous membranes are moist.  Eyes:     Extraocular Movements: Extraocular movements intact.     Conjunctiva/sclera: Conjunctivae normal.     Pupils: Pupils are equal, round, and reactive to light.  Cardiovascular:     Rate and Rhythm: Normal rate and regular rhythm.     Heart sounds: No murmur heard. Pulmonary:     Effort: Pulmonary effort is normal. No respiratory distress.     Breath sounds: Normal breath sounds.  Abdominal:     Palpations: Abdomen is soft.     Tenderness: There is no abdominal tenderness.  Musculoskeletal:        General: No swelling.     Cervical back: Neck supple.     Comments: Tenderness to left  lumbar area, no midline spinal tenderness, positive straight leg raise on the left.  Skin:    General: Skin is warm and dry.     Capillary Refill: Capillary refill takes less than 2 seconds.  Neurological:     General: No focal deficit present.     Mental Status: He is alert and oriented to person, place, and time.     Comments: Gait is antalgic  Psychiatric:        Mood and Affect: Mood normal.     (all labs ordered are listed, but only abnormal results are displayed) Labs Reviewed - No data to display  EKG: None  Radiology: No results found.   Procedures   Medications Ordered in the ED  lidocaine  (LIDODERM ) 5 % 1 patch (has no administration in time range)  ketorolac  (TORADOL ) 15 MG/ML injection 15 mg (has no administration in time range)                                    Medical  Decision Making This patient presents to the ED for concern of back pain this involves an extensive number of treatment options, and is a complaint that carries with it a high risk of complications and morbidity.  The differential diagnosis includes sprain, strain, HNP, fracture, DDD, muscle spasm, cauda equina, epidural abscess or hematoma, malignancy, other   Co morbidities that complicate the patient evaluation  CHF   Additional history obtained:  Additional history obtained from EMR External records from outside source obtained and reviewed including previous notes, medication list, prior labs        Problem List / ED Course / Critical interventions / Medication management  Patient presents with low back pain left side radiating down the left leg. Differential considered as above. Symptoms are likely due to radiculopathy due to spasm versus HNP.  They have no high risk features including saddle anesthesia/paresthesia, bowel or bladder incontinence, urinary retention, fever, weight loss, history of cancer or immune suppression, or IV drug use.  The lack of trauma, midline back pain and red flag symptoms I do not feel he needs any labs or imaging at this time.  We discussed plan to treat symptoms with steroids, analgesics, and follow up with PCP at [facility] clinic and neurosurgery if symptoms are not improving after PCP follow-up.. They were advised on strict return precautions.  I ordered medication including Toradol  and lidocaine  patch for pain  I have reviewed the patients home medicines and have made adjustments as needed       Amount and/or Complexity of Data Reviewed External Data Reviewed: notes.  Risk Prescription drug management.        Final diagnoses:  None    ED Discharge Orders     None          Suellen Sherran DELENA DEVONNA 08/12/24 1006    Yolande Lamar BROCKS, MD 08/19/24 [phone]  "

## 2024-08-12 NOTE — ED Triage Notes (Signed)
 Pt c/o back and leg pain x 2 days. Pt doesn't remember any injury. Pt ambulated to room.
